# Patient Record
Sex: Male | Born: 1940 | Race: White | Hispanic: No | Marital: Married | Smoking: Never smoker
Health system: Southern US, Community
[De-identification: ages and names within clinical notes are randomized; demographics above are authoritative.]

## PROBLEM LIST (undated history)

## (undated) DIAGNOSIS — G20 Parkinson's disease: Secondary | ICD-10-CM

## (undated) DIAGNOSIS — E785 Hyperlipidemia, unspecified: Secondary | ICD-10-CM

## (undated) DIAGNOSIS — M4800 Spinal stenosis, site unspecified: Secondary | ICD-10-CM

## (undated) DIAGNOSIS — G20A1 Parkinson's disease without dyskinesia, without mention of fluctuations: Secondary | ICD-10-CM

## (undated) HISTORY — DX: Hyperlipidemia, unspecified: E78.5

## (undated) HISTORY — PX: KIDNEY SURGERY: SHX687

## (undated) HISTORY — DX: Parkinson's disease without dyskinesia, without mention of fluctuations: G20.A1

## (undated) HISTORY — PX: APPENDECTOMY: SHX54

## (undated) HISTORY — DX: Parkinson's disease: G20

---

## 1998-09-21 ENCOUNTER — Encounter: Payer: Self-pay | Admitting: Cardiology

## 1998-09-21 ENCOUNTER — Ambulatory Visit (HOSPITAL_COMMUNITY): Admission: RE | Admit: 1998-09-21 | Discharge: 1998-09-21 | Payer: Self-pay | Admitting: Cardiology

## 1999-05-01 ENCOUNTER — Encounter: Payer: Self-pay | Admitting: Family Medicine

## 1999-05-01 ENCOUNTER — Encounter: Admission: RE | Admit: 1999-05-01 | Discharge: 1999-05-01 | Payer: Self-pay | Admitting: Family Medicine

## 2002-01-15 ENCOUNTER — Encounter: Admission: RE | Admit: 2002-01-15 | Discharge: 2002-01-15 | Payer: Self-pay | Admitting: Neurology

## 2002-01-15 ENCOUNTER — Encounter: Payer: Self-pay | Admitting: Neurology

## 2007-06-10 ENCOUNTER — Ambulatory Visit: Payer: Self-pay | Admitting: Gastroenterology

## 2007-06-24 ENCOUNTER — Encounter: Payer: Self-pay | Admitting: Gastroenterology

## 2007-06-24 ENCOUNTER — Ambulatory Visit: Payer: Self-pay | Admitting: Gastroenterology

## 2009-03-06 HISTORY — PX: INGUINAL HERNIA REPAIR: SUR1180

## 2010-07-25 ENCOUNTER — Encounter: Payer: Self-pay | Admitting: Gastroenterology

## 2011-11-20 ENCOUNTER — Encounter: Payer: Self-pay | Admitting: Gastroenterology

## 2012-07-07 ENCOUNTER — Telehealth: Payer: Self-pay | Admitting: Neurology

## 2012-07-07 NOTE — Telephone Encounter (Signed)
I called the patient back to find out what meds he wants sent since he does not want them all at the same time.  Got no answer.  Left message.  Pending return call.

## 2012-07-07 NOTE — Telephone Encounter (Signed)
I called back to find out what meds they do want sent now since they do not want all meds sent at once.  Got no answer.  Left message.  Asked that they please specify what med(s) they want sent today.

## 2012-07-09 NOTE — Telephone Encounter (Signed)
I have been unable to reach patient.  I do not know what his co-pay is, and therefore do not know which med(s) he can afford at this time.  Since he does not want them sent all at the same time, I do not know which one(s) he wants sent when.  Will have to wait for return call.

## 2012-07-10 MED ORDER — AMANTADINE HCL 100 MG PO CAPS: 100.0000 mg | ORAL_CAPSULE | Freq: Two times a day (BID) | ORAL | Status: DC

## 2012-07-10 MED ORDER — CARBIDOPA-LEVODOPA-ENTACAPONE 37.5-150-200 MG PO TABS: 1.0000 | ORAL_TABLET | Freq: Three times a day (TID) | ORAL | Status: DC

## 2012-07-10 NOTE — Telephone Encounter (Signed)
I called again and finally reached the patient.  He would like refills on Amantadine and Stalevo.  I told him I will send those today.  He would like them sent to mail order.  Says he does not need any at local pharmacy at this time.  He will call back when he needs refill on other medication.  Former [facility] patient assigned to Dr Frances Furbish.

## 2012-10-12 ENCOUNTER — Encounter: Payer: Self-pay | Admitting: Neurology

## 2012-10-12 ENCOUNTER — Ambulatory Visit (INDEPENDENT_AMBULATORY_CARE_PROVIDER_SITE_OTHER): Payer: Medicare Other | Admitting: Neurology

## 2012-10-12 VITALS — BP 105/64 | HR 69 | Temp 97.4°F | Ht 67.0 in | Wt 168.0 lb

## 2012-10-12 DIAGNOSIS — G20 Parkinson's disease: Secondary | ICD-10-CM

## 2012-10-12 DIAGNOSIS — G20A1 Parkinson's disease without dyskinesia, without mention of fluctuations: Secondary | ICD-10-CM

## 2012-10-12 HISTORY — DX: Parkinson's disease: G20

## 2012-10-12 HISTORY — DX: Parkinson's disease without dyskinesia, without mention of fluctuations: G20.A1

## 2012-10-12 NOTE — Progress Notes (Signed)
Subjective:    Mathews ID: Mark Mathews is a 72 y.o. male.  HPI  Interim history:   Mark Mathews is a very pleasant 72 year old right-handed gentleman who presents for followup consultation of his Parkinson's disease. He is unaccompanied today. This is his first visit after Dr. Imagene Gurney retirement and he last saw Dr. Fayrene Fearing love on 04/13/2012, at which time Dr. Sandria Manly felt that he was doing well. He stressed Mark importance of exercise. He did well on rotigotine patch in Mark past and this should be a consideration down Mark road he felt. He has an underlying medical history of hyperlipidemia and his current medications are baby aspirin, Artane 2 mg strength one tablet 3 times a day, at 7:30, 1 PM and 6 PM. He is on Stalevo 150 mg strength one pill 3 times a day at those times as well as amantadine 100 mg strength one tablet twice daily.  I reviewed Dr. Imagene Gurney prior notes and Mark Mathews's records and below is a summary of that review:  72 year old right-handed gentleman who started having right hand and arm tremor and was diagnosed with Parkinson's disease in July 1997. He did well on Mark dopamine agonist patch until he was taken off Mark market. He denies motor fluctuations and motor complications, orthostatic hypotension, excessive daytime somnolence, lower extremity edema, compulsive behavior, bladder incontinence, hallucinations, delusions, depression, recurrent falls or dystonia or memory loss. In February of this year his MMSE was 26, clock drawing was 3, animal fluency was 14. He describes mild, intermittent dyskinesias with wearing off. This does not bother him. He has no new complaints. He feels that he is stable. He describes no side effects. He tolerates Mark medications. His memory is stable he feels. He has mild forgetfulness.   His Past Medical History Is Significant For: Past Medical History  Diagnosis Date  . Other and unspecified hyperlipidemia   . Paralysis agitans     His Past  Surgical History Is Significant For: Past Surgical History  Procedure Laterality Date  . Inguinal hernia repair Bilateral 03/06/2009    His Family History Is Significant For: Family History  Problem Relation Age of Onset  . Heart failure Father     His Social History Is Significant For: History   Social History  . Marital Status: Married    Spouse Name: N/A    Number of Children: N/A  . Years of Education: N/A   Social History Main Topics  . Smoking status: Never Smoker   . Smokeless tobacco: None  . Alcohol Use: None  . Drug Use: None  . Sexually Active: None   Other Topics Concern  . None   Social History Narrative  . None    His Allergies Are:  No Known Allergies:   His Current Medications Are:  Outpatient Encounter Prescriptions as of 10/12/2012  Medication Sig Dispense Refill  . amantadine (SYMMETREL) 100 MG capsule Take 1 capsule (100 mg total) by mouth 2 (two) times daily.  180 capsule  2  . carbidopa-levodopa-entacapone (STALEVO) 37.5-150-200 MG per tablet Take 1 tablet by mouth 3 (three) times daily. 7:30am, 1:00pm and 6:00pm  270 tablet  2  . trihexyphenidyl (ARTANE) 2 MG tablet Take 2 mg by mouth 3 (three) times daily. 7:30am, 1:00pm and 6:00pm       No facility-administered encounter medications on file as of 10/12/2012.    Review of Systems  Hematological: Bruises/bleeds easily.    Objective:  Neurologic Exam  Physical Exam Physical Examination:  Filed Vitals:   10/12/12 0950  BP: 105/64  Pulse: 69  Temp: 97.4 F (36.3 C)    General Examination: Mark Mathews is a very pleasant 72 y.o. male in no acute distress.  HEENT: Normocephalic, atraumatic, pupils are equal, round and reactive to light and accommodation. Funduscopic exam is normal with sharp disc margins noted. Extraocular tracking shows mild saccadic breakdown without nystagmus noted. There is no limitation to upper gaze. There is mild decrease in eye blink rate. Hearing is intact.  Tympanic membranes are clear bilaterally. Face is symmetric with mild facial masking and normal facial sensation. There is no lip, neck or jaw tremor. Neck is mildly rigid with intact passive ROM. There are no carotid bruits on auscultation. Oropharynx exam reveals mild mouth dryness. No significant airway crowding is noted. Mallampati is class II. Tongue protrudes centrally and palate elevates symmetrically.   There is no drooling.   Chest: is clear to auscultation without wheezing, rhonchi or crackles noted.  Heart: sounds are regular and normal without murmurs, rubs or gallops noted.   Abdomen: is soft, non-tender and non-distended with normal bowel sounds appreciated on auscultation.  Extremities: There is no pitting edema in Mark distal lower extremities bilaterally. Pedal pulses are intact. Chronic stasis-like changes are noted in Mark distal legs bilaterally. There are no varicose veins.  Skin: is warm and dry with no trophic changes noted. Age-related changes are noted on Mark skin.   Musculoskeletal: exam reveals no obvious joint deformities, tenderness, joint swelling or erythema.  Neurologically:  Mental status: Mark Mathews is awake and alert, paying good  attention. He is able to completely provide Mark history. He is oriented to: person, place, time/date, situation, day of week, month of year and year. His memory, attention, language and knowledge are fairly well preserved. There is no aphasia, agnosia, apraxia or anomia. There is a mild degree of bradyphrenia. Speech is moderately hypophonic with mild dysarthria noted. Mood is congruent and affect is normal.   Cranial nerves are as described above under HEENT exam. In addition, shoulder shrug is normal with equal shoulder height noted.  Motor exam: Normal bulk, and strength for age is noted. There are mild dyskinesias noted.   Tone is mildly rigid with presence of cogwheeling in Mark right upper extremity. There is overall mild  bradykinesia. There is no drift or rebound.  There is a mild resting tremor in Mark right upper extremity and a mild resting tremor in Mark right lower extremity. Mark tremor is intermittent.  Romberg is negative.  Reflexes are 2+ in Mark upper extremities and 1+ in Mark lower extremities.  Fine motor skills exam: Finger taps are mildly impaired on Mark right and Minimally impaired on Mark left. Hand movements are mildly impaired on Mark right and mildly impaired on Mark left. RAP (rapid alternating patting) is mildly impaired on Mark right and mildly impaired on Mark left. Foot taps are mildly to moderately impaired on Mark right and mildly impaired on Mark left. Foot agility (in Mark form of heel stomping) is mildly to moderately impaired on Mark right and mildly impaired on Mark left.    Cerebellar testing shows no dysmetria or intention tremor on finger to nose testing. Heel to shin is unremarkable bilaterally. There is no truncal or gait ataxia.   Sensory exam is intact to light touch, pinprick, vibration, temperature sense and proprioception in Mark upper and lower extremities.   Gait, station and balance: He stands up from Mark seated position with no  significant difficulty and does not need to push up with His hands. He needs no assistance. No veering to one side is noted. He is noted to lean to Mark right slightly been sitting. Posture is mildly stooped. Stance is narrow-based. He walks with decrease in stride length and pace and decreased arm swing on Mark left with a slight intermittent hand tremor noted while walking on Mark left. He turns en bloc. Tandem walk is not possible. Balance is Preserved.      Assessment and Plan:   Assessment and Plan:  In summary, Mark Mathews is a very pleasant 72 y.o.-year old male with a history of Parkinson's disease, right-sided predominant, diagnosed in 13. His physical exam is stable and has not progressed in Mark last 6 months. He is doing fairly well at this time and I  reassured Mark Mathews in that regard.  I had a long chat with Mark Mathews about His symptoms, my findings and Mark diagnosis of Parksinson's disease, its prognosis and treatment options. We talked about medical treatments and non-pharmacological approaches. We talked about maintaining a healthy lifestyle in general. I encouraged Mark Mathews to eat healthy, exercise daily and keep well hydrated, to keep a scheduled bedtime and wake time routine, to not skip any meals and eat healthy snacks in between meals and to have protein with every meal. In particular, I stressed Mark importance of regular exercise, within of course Mark Mathews's own mobility limitations.   As far as further diagnostic testing is concerned, I suggested: no change. As far as medications are concerned, I recommended Mark following at this time: no change. He did not require any refills today.   I answered all his questions today and Mark Mathews was in agreement with Mark above outlined plan. I would like to see Mark Mathews back in 6 months, sooner if Mark need arises and encouraged him to call with any interim questions, concerns, problems or updates and refill requests.

## 2012-10-12 NOTE — Patient Instructions (Addendum)
I think overall you are doing fairly well and are stable at this point.   I do have some generic suggestions for you today:  Please make sure that you drink plenty of fluids. I would like for you to exercise daily for example in the form of walking 20-30 minutes every day, if you can. Please keep a regular sleep-wake schedule, keep regular meal times, do not skip any meals, eat  healthy snacks in between meals, such as fruit or nuts. Try to eat protein with every meal.   As far as your medications are concerned, I would like to suggest: no changes.   As far as diagnostic testing, I recommend: no new test.   Engage in social activities in your community and with your family and try to keep up with current events by reading the newspaper or watching the news.  I do not think we need to make any changes in your medications at this point. I think you're stable enough that I can see you back in 6 months, sooner if we need to. Please call us if you have any interim questions, concerns, or problems or updates to need to discuss.  Brett Canales is my clinical assistant and will answer any of your questions and relay your messages to me and will give you my messages.   Our phone number is [phone]. We also have an after hours call service for urgent matters and there is a physician on-call for urgent questions. For any emergencies you know to call 911 or go to the nearest emergency room.

## 2013-01-05 ENCOUNTER — Encounter: Payer: Self-pay | Admitting: Gastroenterology

## 2013-01-22 ENCOUNTER — Telehealth: Payer: Self-pay | Admitting: Neurology

## 2013-01-22 MED ORDER — CARBIDOPA-LEVODOPA-ENTACAPONE 37.5-150-200 MG PO TABS: 1.0000 | ORAL_TABLET | Freq: Three times a day (TID) | ORAL | Status: DC

## 2013-01-22 MED ORDER — TRIHEXYPHENIDYL HCL 2 MG PO TABS: 2.0000 mg | ORAL_TABLET | Freq: Three times a day (TID) | ORAL | Status: DC

## 2013-01-22 MED ORDER — AMANTADINE HCL 100 MG PO CAPS: 100.0000 mg | ORAL_CAPSULE | Freq: Two times a day (BID) | ORAL | Status: DC

## 2013-01-22 NOTE — Telephone Encounter (Signed)
I called pt to relay that received message and forwarded it to [place] B in pharmacy,  He need one weeks worth of all meds (noted) for PD.  His mail order delayed.  May LM on phone # [employer].  [facility] is where the weeks worth of med to be called in.

## 2013-01-22 NOTE — Telephone Encounter (Signed)
Please advise 

## 2013-01-22 NOTE — Telephone Encounter (Signed)
I sent the pharmacy a 2 week supply in case there is a delay in the mail due to the upcoming Thanksgiving Holiday.

## 2013-04-12 ENCOUNTER — Telehealth: Payer: Self-pay | Admitting: Neurology

## 2013-04-12 MED ORDER — CARBIDOPA-LEVODOPA-ENTACAPONE 37.5-150-200 MG PO TABS: 1.0000 | ORAL_TABLET | Freq: Three times a day (TID) | ORAL | Status: DC

## 2013-04-12 MED ORDER — TRIHEXYPHENIDYL HCL 2 MG PO TABS: 2.0000 mg | ORAL_TABLET | Freq: Three times a day (TID) | ORAL | Status: DC

## 2013-04-12 MED ORDER — AMANTADINE HCL 100 MG PO CAPS: 100.0000 mg | ORAL_CAPSULE | Freq: Two times a day (BID) | ORAL | Status: DC

## 2013-04-12 NOTE — Telephone Encounter (Signed)
Refills sent to last until appt  

## 2013-04-12 NOTE — Telephone Encounter (Signed)
During appointment reminder call PT stated that he needed his Stalevo, Artane and Symmetrel prescriptions all need to go to [facility] for refill.  If there are any questions please call.

## 2013-04-14 ENCOUNTER — Ambulatory Visit: Payer: Medicare Other | Admitting: Neurology

## 2013-08-12 ENCOUNTER — Encounter: Payer: Self-pay | Admitting: Neurology

## 2013-08-12 ENCOUNTER — Ambulatory Visit (INDEPENDENT_AMBULATORY_CARE_PROVIDER_SITE_OTHER): Payer: Medicare Other | Admitting: Neurology

## 2013-08-12 VITALS — BP 111/68 | HR 72 | Temp 97.4°F | Ht 67.0 in | Wt 171.0 lb

## 2013-08-12 DIAGNOSIS — G20 Parkinson's disease: Secondary | ICD-10-CM

## 2013-08-12 MED ORDER — AMANTADINE HCL 100 MG PO CAPS: 100.0000 mg | ORAL_CAPSULE | Freq: Two times a day (BID) | ORAL | Status: DC

## 2013-08-12 MED ORDER — TRIHEXYPHENIDYL HCL 2 MG PO TABS: 2.0000 mg | ORAL_TABLET | Freq: Three times a day (TID) | ORAL | Status: DC

## 2013-08-12 MED ORDER — CARBIDOPA-LEVODOPA-ENTACAPONE 37.5-150-200 MG PO TABS: 1.0000 | ORAL_TABLET | Freq: Three times a day (TID) | ORAL | Status: DC

## 2013-08-12 NOTE — Patient Instructions (Signed)
I think your Parkinson's disease has remained fairly stable, which is reassuring. Nevertheless, as you know, this disease does progress with time. It can affect your balance, your memory, your mood, your bowel and bladder function, your posture, balance and walking. Overall you are doing fairly well but I do want to suggest a few things today:  Remember to drink plenty of fluid, eat healthy meals and do not skip any meals. Try to eat protein with a every meal and eat a healthy snack such as fruit or nuts in between meals. Try to keep a regular sleep-wake schedule and try to exercise daily, particularly in the form of walking, 20-30 minutes a day, if you can.   Taking your medication on schedule is key.   Try to stay active physically and mentally. Engage in social activities in your community and with your family and try to keep up with current events by reading the newspaper or watching the news. Try to do word puzzles and you may like to do word puzzles and brain games on the computer such as on [email or web address]   As far as your medications are concerned, I would like to suggest that you take your current medication with the following additional changes: no changes today.     As far as diagnostic testing, I will order: no new test.   I would like to see you back in 6 months, sooner if we need to. Please call us with any interim questions, concerns, problems, updates or refill requests.  I have refilled your amantadine, artane and Stalevo.

## 2013-08-12 NOTE — Progress Notes (Signed)
Subjective:    Mark Mathews Mathews ID: Mark Mathews Mark Mathews Mathews is a 73 y.o. male.  HPI    Interim history:   Mark Mathews Mark Mathews Mathews is a very pleasant 73 year old right-handed gentleman with a history of hyperlipidemia and an otherwise benign medical history, who presents for followup consultation of his right-sided predominant Parkinson's disease, associated with mild memory loss and mild, intermittent dyskinesias with wearing off. He is unaccompanied today. I first met him on 10/12/2012, at which time I felt he was stable and I did not make any changes to his medications at Mark Mathews time. He has been on baby aspirin, Artane 2 mg strength one tablet 3 times a day, at 7:30, 1 PM and 6 PM. He is on Stalevo 150 mg strength one pill 3 times a day at those times as well as amantadine 100 mg strength one tablet twice daily.   Today, he reports feeling well and stable. He had missed an appointment, because he had to tend to his wife, who was diagnosed with breast cancer and has been in treatment. He denies any recent falls, he drives without problems, he denies anxiety, depression, delusions, memory loss other than mild forgetfulness, bladder problems, he had some intermittent constipation but has increased his fresh fruit intake which has helped. He tries to walk regularly but lately has not walked with regularity.   He previously followed with Dr. Morene Antu and was last seen by him on 04/13/2012, at which time Dr. Erling Cruz felt that he was doing well. He stressed Mark Mathews importance of exercise. He did well on rotigotine patch in Mark Mathews past and this should be a consideration down Mark Mathews road. He started having right hand and arm tremor and was diagnosed with Parkinson's disease in July 1997. He did well on Mark Mathews dopamine agonist patch until it was taken off Mark Mathews market at Mark Mathews time. He denies motor fluctuations and motor complications, orthostatic hypotension, excessive daytime somnolence, lower extremity edema, compulsive behavior, bladder incontinence,  hallucinations, delusions, depression, recurrent falls or dystonia but has had mild memory loss. In February of this year his MMSE was 26, clock drawing was 3, animal fluency was 14.   His Past Medical History Is Significant For: Past Medical History  Diagnosis Date  . Other and unspecified hyperlipidemia   . Paralysis agitans   . Parkinson's disease 10/12/2012    His Past Surgical History Is Significant For: Past Surgical History  Procedure Laterality Date  . Inguinal hernia repair Bilateral 03/06/2009    His Family History Is Significant For: Family History  Problem Relation Age of Onset  . Heart failure Father     His Social History Is Significant For: History   Social History  . Marital Status: Married    Spouse Name: N/A    Number of Children: N/A  . Years of Education: N/A   Social History Main Topics  . Smoking status: Never Smoker   . Smokeless tobacco: None  . Alcohol Use: None  . Drug Use: None  . Sexual Activity: None   Other Topics Concern  . None   Social History Narrative  . None    His Allergies Are:  No Known Allergies:   His Current Medications Are:  Outpatient Encounter Prescriptions as of 08/12/2013  Medication Sig  . amantadine (SYMMETREL) 100 MG capsule Take 1 capsule (100 mg total) by mouth 2 (two) times daily.  Marland Kitchen aspirin 81 MG tablet Take 81 mg by mouth daily.  . carbidopa-levodopa-entacapone (STALEVO) 37.5-150-200 MG per tablet Take 1 tablet  by mouth 3 (three) times daily. 7:30am, 1:00pm and 6:00pm  . KRILL OIL PO Take 1 capsule by mouth daily.  . Multiple Vitamin (MULTIVITAMIN) tablet Take 1 tablet by mouth daily.  . trihexyphenidyl (ARTANE) 2 MG tablet Take 1 tablet (2 mg total) by mouth 3 (three) times daily. 7:30am, 1:00pm and 6:00pm  . [DISCONTINUED] amantadine (SYMMETREL) 100 MG capsule Take 1 capsule (100 mg total) by mouth 2 (two) times daily.  . [DISCONTINUED] carbidopa-levodopa-entacapone (STALEVO) 37.5-150-200 MG per tablet Take  1 tablet by mouth 3 (three) times daily. 7:30am, 1:00pm and 6:00pm  . [DISCONTINUED] trihexyphenidyl (ARTANE) 2 MG tablet Take 1 tablet (2 mg total) by mouth 3 (three) times daily. 7:30am, 1:00pm and 6:00pm  :  Review of Systems:  Out of a complete 14 point review of systems, all are reviewed and negative with Mark Mathews exception of these symptoms as listed below:   Review of Systems  Constitutional: Negative.   HENT: Negative.   Eyes: Negative.   Respiratory: Negative.   Cardiovascular: Negative.   Gastrointestinal: Negative.   Endocrine: Negative.   Genitourinary: Negative.   Musculoskeletal: Negative.   Skin: Negative.   Allergic/Immunologic: Negative.   Neurological: Negative.   Hematological: Negative.   Psychiatric/Behavioral: Negative.   All other systems reviewed and are negative.   Objective:  Neurologic Exam  Physical Exam Physical Examination:   Filed Vitals:   08/12/13 0917  BP: 111/68  Pulse: 72  Temp: 97.4 F (36.3 C)   General Examination: Mark Mathews Mark Mathews Mathews is a very pleasant 73 y.o. male in no acute distress.  HEENT: Normocephalic, atraumatic, pupils are equal, round and reactive to light and accommodation. Funduscopic exam is normal with sharp disc margins noted. Extraocular tracking shows mild saccadic breakdown without nystagmus noted. There is no limitation to upper gaze. There is mild decrease in eye blink rate. Hearing is intact. Tympanic membranes are clear bilaterally. Face is symmetric with mild facial masking and normal facial sensation. There is no lip, neck or jaw tremor. Neck is mildly rigid with intact passive ROM. There are no carotid bruits on auscultation. Oropharynx exam reveals mild mouth dryness. No significant airway crowding is noted. Mallampati is class II. Tongue protrudes centrally and palate elevates symmetrically. There is no drooling.   Chest: is clear to auscultation without wheezing, rhonchi or crackles noted.  Heart: sounds are regular  and normal without murmurs, rubs or gallops noted.   Abdomen: is soft, non-tender and non-distended with normal bowel sounds appreciated on auscultation.  Extremities: There is no pitting edema in Mark Mathews distal lower extremities bilaterally. Pedal pulses are intact. Chronic stasis-like changes are noted in Mark Mathews distal legs bilaterally. There are no varicose veins.  Skin: is warm and dry with no trophic changes noted. Age-related changes are noted on Mark Mathews skin.   Musculoskeletal: exam reveals no obvious joint deformities, tenderness, joint swelling or erythema.  Neurologically:  Mental status: Mark Mathews Mark Mathews Mathews is awake and alert, paying good  attention. He is able to completely provide Mark Mathews history. He is oriented to: person, place, time/date, situation, day of week, month of year and year. His memory, attention, language and knowledge are fairly well preserved. There is no aphasia, agnosia, apraxia or anomia. There is a mild degree of bradyphrenia. Speech is moderately hypophonic with mild dysarthria noted. Mood is congruent and affect is normal.   Cranial nerves are as described above under HEENT exam. In addition, shoulder shrug is normal with equal shoulder height noted.  Motor exam: Normal bulk, and strength  for age is noted. There are mild dyskinesias noted.   Tone is mildly rigid with presence of cogwheeling in Mark Mathews right upper extremity. There is overall mild bradykinesia. There is no drift or rebound.  There is a no significant resting tremor today. He seems to have very slight, intermittent lower body dyskinesias. He's not aware of these movements.  Romberg is negative.   Reflexes are 1+ in Mark Mathews upper extremities and 1+ in Mark Mathews lower extremities.  Fine motor skills exam: Finger taps are mildly impaired on Mark Mathews left and Minimally impaired on Mark Mathews right. Hand movements are mildly impaired on Mark Mathews right and mildly impaired on Mark Mathews left. RAP (rapid alternating patting) is mildly impaired on Mark Mathews right and  mildly impaired on Mark Mathews left. Foot taps are mildly to moderately impaired on Mark Mathews right and mild to moderately impaired on Mark Mathews left. Foot agility (in Mark Mathews form of heel stomping) is mildly to moderately impaired b/l.   Cerebellar testing shows no dysmetria or intention tremor on finger to nose testing. Heel to shin is unremarkable bilaterally. There is no truncal or gait ataxia.   Sensory exam is intact to light touch, pinprick, vibration, temperature sense in Mark Mathews upper and lower extremities.   Gait, station and balance: He stands up from Mark Mathews seated position with no significant difficulty and does not need to push up with His hands. He needs no assistance. No veering to one side is noted. He is noted to lean to Mark Mathews right slightly been sitting. Posture is mildly stooped. Stance is narrow-based. He walks with decrease in stride length and pace and decreased arm swing on Mark Mathews left with a slight intermittent hand tremor noted while walking on Mark Mathews left. He turns en bloc. Tandem walk is not possible. Balance is Preserved.      Assessment and Plan:   In summary, Mark Mathews Mark Mathews Mathews is a very pleasant 73 year old male with a history of Parkinson's disease, left-sided predominant, diagnosed in 58. His physical exam is fairly stable. I mentioned R sided predominance last time, but he appears to have L sided predominant findings. He is doing fairly well at this time and I reassured Mark Mathews Mark Mathews Mathews in that regard.  I again had a long chat with Mark Mathews Mark Mathews Mathews about His symptoms, my findings and Mark Mathews diagnosis of Parksinson's disease, its prognosis and treatment options. We talked about medical treatments and non-pharmacological approaches. We talked about maintaining a healthy lifestyle in general. I encouraged Mark Mathews Mark Mathews Mathews to eat healthy, exercise daily and keep well hydrated, to keep a scheduled bedtime and wake time routine, to not skip any meals and eat healthy snacks in between meals and to have protein with every meal. In  particular, I stressed Mark Mathews importance of regular exercise, within of course Mark Mathews Mark Mathews Mathews's own mobility limitations.   As far as further diagnostic testing is concerned, I suggested: no change. As far as medications are concerned, I recommended Mark Mathews following at this time: no change. I renewed his prescriptions for mail order today. I answered all his questions today and Mark Mathews Mark Mathews Mathews was in agreement with Mark Mathews above outlined plan. I would like to see Mark Mathews Mark Mathews Mathews back in 6 months, sooner if Mark Mathews need arises and encouraged him to call with any interim questions, concerns, problems or updates and refill requests.

## 2014-02-11 ENCOUNTER — Encounter: Payer: Self-pay | Admitting: Neurology

## 2014-02-11 ENCOUNTER — Ambulatory Visit (INDEPENDENT_AMBULATORY_CARE_PROVIDER_SITE_OTHER): Payer: Medicare Other | Admitting: Neurology

## 2014-02-11 VITALS — BP 107/67 | HR 73 | Temp 97.0°F | Ht 66.5 in | Wt 172.0 lb

## 2014-02-11 DIAGNOSIS — G20 Parkinson's disease: Secondary | ICD-10-CM

## 2014-02-11 NOTE — Patient Instructions (Addendum)
I think your Parkinson's disease has remained fairly stable, which is reassuring. Nevertheless, as you know, this disease does progress with time. It can affect your balance, your memory, your mood, your bowel and bladder function, your posture, balance and walking. Overall you are doing fairly well but I do want to suggest a few things today:  Remember to drink plenty of fluid, eat healthy meals and do not skip any meals. Try to eat protein with a every meal and eat a healthy snack such as fruit or nuts in between meals. Try to keep a regular sleep-wake schedule and try to exercise daily, particularly in the form of walking, 20 minutes a day, if you can.   I do need you to drink more water, try to drink at least 6 glasses.   Taking your medication on schedule is key. Please do not climb ladders.   Try to stay active physically and mentally. Engage in social activities in your community and with your family and try to keep up with current events by reading the newspaper or watching the news. Try to do word puzzles and you may like to do word puzzles and brain games on the computer such as on [email or web address].   As far as your medications are concerned, I would like to suggest that you take your current medication with the following additional changes: no changes.   I would like to see you back in 3 months, sooner if we need to. Please call us with any interim questions, concerns, problems, updates or refill requests.  Our phone number is [phone]. We also have an after hours call service for urgent matters and there is a physician on-call for urgent questions, that cannot wait till the next work day. For any emergencies you know to call 911 or go to the nearest emergency room.

## 2014-02-11 NOTE — Progress Notes (Signed)
Subjective:    Patient ID: [name] is a 73 y.o. male.  HPI     Interim history:   Mark Mathews is a very pleasant 73 year old right-handed gentleman with a history of hyperlipidemia and an otherwise benign medical history, who presents for followup consultation of his left-sided predominant Parkinson's disease, associated with mild memory loss and mild, intermittent dyskinesias with wearing off. He is unaccompanied today. I last saw him on 08/12/2013, at which time he reported feeling well and stable. Unfortunately, in the interim, his wife had been diagnosed with breast cancer. He had to miss an appointment because of his health issues. He denied any recent falls and had no issues driving. He had no mood or memory issues other than known and mild forgetfulness. He reported intermittent constipation but was able to increase his natural fiber intake. He was not walking as regularly as before. I did not suggest any medication changes.  Today, he reports doing well, and wife is better, she had surgery. He walks regularly. He does admit that he may not drink enough water. Overall he feels stable.  I first met him on 10/12/2012, at which time I felt he was stable and I did not make any changes to his medications at the time. He has been on baby aspirin, Artane 2 mg strength one tablet 3 times a day, at 7:30, 1 PM and 6 PM. He is on Stalevo 150 mg strength one pill 3 times a day at those times as well as amantadine 100 mg strength one tablet twice daily.   He previously followed with Dr. Morene Antu and was last seen by him on 04/13/2012, at which time Dr. Erling Cruz felt that he was doing well. He stressed the importance of exercise. He did well on rotigotine patch in the past and this should be a consideration down the road. He started having right hand and arm tremor and was diagnosed with Parkinson's disease in July 1997. He did well on the dopamine agonist patch until it was taken off the market at the  time. He denies motor fluctuations and motor complications, orthostatic hypotension, excessive daytime somnolence, lower extremity edema, compulsive behavior, bladder incontinence, hallucinations, delusions, depression, recurrent falls or dystonia but has had mild memory loss. In February of this year his MMSE was 26, clock drawing was 3, animal fluency was 14.   His Past Medical History Is Significant For: Past Medical History  Diagnosis Date  . Other and unspecified hyperlipidemia   . Paralysis agitans   . Parkinson's disease 10/12/2012    His Past Surgical History Is Significant For: Past Surgical History  Procedure Laterality Date  . Inguinal hernia repair Bilateral 03/06/2009    His Family History Is Significant For: Family History  Problem Relation Age of Onset  . Heart failure Father     His Social History Is Significant For: History   Social History  . Marital Status: Married    Spouse Name: Mark Mathews    Number of Children: 2  . Years of Education: college   Occupational History  .      retired   Social History Main Topics  . Smoking status: Never Smoker   . Smokeless tobacco: Never Used  . Alcohol Use: No  . Drug Use: No  . Sexual Activity: None   Other Topics Concern  . None   Social History Narrative   Consumes caffeine rarely,1 cup daily    His Allergies Are:  No Known Allergies:  His Current Medications Are:  Outpatient Encounter Prescriptions as of 02/11/2014  Medication Sig  . amantadine (SYMMETREL) 100 MG capsule Take 1 capsule (100 mg total) by mouth 2 (two) times daily.  Marland Kitchen aspirin 81 MG tablet Take 81 mg by mouth daily.  . carbidopa-levodopa-entacapone (STALEVO) 37.5-150-200 MG per tablet Take 1 tablet by mouth 3 (three) times daily. 7:30am, 1:00pm and 6:00pm  . KRILL OIL PO Take 1 capsule by mouth daily.  . Multiple Vitamin (MULTIVITAMIN) tablet Take 1 tablet by mouth daily.  . trihexyphenidyl (ARTANE) 2 MG tablet Take 1 tablet (2 mg total)  by mouth 3 (three) times daily. 7:30am, 1:00pm and 6:00pm  :  Review of Systems:  Out of a complete 14 point review of systems, all are reviewed and negative with the exception of these symptoms as listed below:   Review of Systems  All other systems reviewed and are negative.   Objective:  Neurologic Exam  Physical Exam Physical Examination:   Filed Vitals:   02/11/14 1107  BP: 107/67  Pulse: 73  Temp: 97 F (36.1 C)   General Examination: The patient is a very pleasant 73 y.o. male in no acute distress.  HEENT: Normocephalic, atraumatic, pupils are equal, round and reactive to light and accommodation. Extraocular tracking shows mild saccadic breakdown without nystagmus noted. There is no limitation to upper gaze. There is mild decrease in eye blink rate. Hearing is intact. Face is symmetric with mild facial masking and normal facial sensation. There is no lip, neck or jaw tremor. Neck is mildly rigid with intact passive ROM. There are no carotid bruits on auscultation. Oropharynx exam reveals moderate mouth dryness. No significant airway crowding is noted. Mallampati is class II. Tongue protrudes centrally and palate elevates symmetrically. There is no drooling.   Chest: is clear to auscultation without wheezing, rhonchi or crackles noted.  Heart: sounds are regular and normal without murmurs, rubs or gallops noted.   Abdomen: is soft, non-tender and non-distended with normal bowel sounds appreciated on auscultation.  Extremities: There is no pitting edema in the distal lower extremities bilaterally. Pedal pulses are intact. Chronic stasis-like changes are noted in the distal legs bilaterally. There are no varicose veins.  Skin: is warm and dry with no trophic changes noted. Age-related changes are noted on the skin.   Musculoskeletal: exam reveals no obvious joint deformities, tenderness, joint swelling or erythema.  Neurologically:  Mental status: The patient is awake and  alert, paying good  attention. He is able to completely provide the history. He is oriented to: person, place, time/date, situation, day of week, month of year and year. His memory, attention, language and knowledge are fairly well preserved. There is no aphasia, agnosia, apraxia or anomia. There is a mild degree of bradyphrenia. Speech is moderately hypophonic with mild dysarthria noted. Mood is congruent and affect is normal.   Cranial nerves are as described above under HEENT exam. In addition, shoulder shrug is normal with equal shoulder height noted.  Motor exam: Normal bulk, and strength for age is noted. There are mild dyskinesias noted.   Tone is mildly rigid with presence of cogwheeling in the right upper extremity. There is overall mild bradykinesia. There is no drift or rebound.  There is an intermittent resting tremor in both upper extremities. He has evidence of slight, intermittent lower body dyskinesias. Romberg is negative.   Reflexes are 1+ in the upper extremities and 1+ in the lower extremities.  Fine motor skills exam: Finger taps  are mildly impaired on the left and Minimally impaired on the right. Hand movements are mildly impaired on the right and mildly impaired on the left. RAP (rapid alternating patting) is mildly impaired on the right and mildly impaired on the left. Foot taps are mildly to moderately impaired on the right and mild to moderately impaired on the left. Foot agility (in the form of heel stomping) is mildly to moderately impaired b/l.   Cerebellar testing shows no dysmetria or intention tremor on finger to nose testing. Heel to shin is unremarkable bilaterally. There is no truncal or gait ataxia.   Sensory exam is intact to light touch.   Gait, station and balance: He stands up from the seated position with no significant difficulty and does not need to push up with His hands. He needs no assistance. No veering to one side is noted. He is noted to lean to the  right slightly while sitting and he has with standing some scoliosis and his upper body slightly kinked to the right. Posture is mildly stooped, more pronounced from last time. Stance is narrow-based. He walks with decrease in stride length and pace and decreased arm swing on the left with a slight intermittent hand tremor noted while walking on the left. He turns en bloc. Tandem walk is not possible. Balance is minimally impaired.       Assessment and Plan:   In summary, Mark Mathews is a very pleasant 73 year old male with a history of Parkinson's disease, left-sided predominant, diagnosed in 13. His physical exam is fairly stable. I mentioned R sided predominance during my first visit, and he recalls noticing a R foot tremor while in his dentist's chair (back in 1997), but for me, he appears to have L sided predominant findings. He is doing fairly well at this time and I reassured the patient in that regard. He has had a more consistent tremor today and his posture seems worse than last time. I again had a long chat with the patient about His symptoms, my findings and the diagnosis of Parksinson's disease, its prognosis and treatment options. We talked about medical treatments and non-pharmacological approaches. We talked about maintaining a healthy lifestyle in general. I encouraged the patient to eat healthy, exercise daily and keep well hydrated, to keep a scheduled bedtime and wake time routine, to not skip any meals and eat healthy snacks in between meals and to have protein with every meal. In particular, I stressed the importance of regular exercise, within of course the patient's own mobility limitations.  I did ask him to increase his water intake. As far as further diagnostic testing is concerned, I suggested: no change. As far as medications are concerned, I recommended the following at this time: no change. We talked about potentially increasing his Stalevo generic down the Road. His  amantadine and Artane will be probably not increased even in the future for fear of side effects. He does have some mouth dryness. I answered all his questions today and the patient was in agreement with the above outlined plan. I would like to see the patient back in 6 months, sooner if the need arises and encouraged him to call with any interim questions, concerns, problems or updates and refill requests.

## 2014-03-04 HISTORY — PX: CATARACT EXTRACTION: SUR2

## 2014-04-26 ENCOUNTER — Telehealth: Payer: Self-pay | Admitting: *Deleted

## 2014-04-26 ENCOUNTER — Encounter: Payer: Self-pay | Admitting: Neurology

## 2014-04-26 NOTE — Telephone Encounter (Signed)
Patient Handicapped Placard at the front desk.

## 2014-04-27 ENCOUNTER — Telehealth: Payer: Self-pay | Admitting: Neurology

## 2014-04-27 NOTE — Telephone Encounter (Signed)
Pls offer FU appt. Need 30 min.

## 2014-04-27 NOTE — Telephone Encounter (Signed)
Left message for patient to return call back to schedule sooner appointment per Dr Frances FurbishAthar.

## 2014-04-27 NOTE — Telephone Encounter (Signed)
Patient's wife is calling because patient is continually falling and his balance is off. Please call and discuss. Thank you.

## 2014-04-28 NOTE — Telephone Encounter (Signed)
Patient has been scheduled and confirmed with wife for sooner appt, per Dr. Frances FurbishAthar.

## 2014-05-05 ENCOUNTER — Encounter: Payer: Self-pay | Admitting: Neurology

## 2014-05-05 ENCOUNTER — Ambulatory Visit (INDEPENDENT_AMBULATORY_CARE_PROVIDER_SITE_OTHER): Payer: Medicare Other | Admitting: Neurology

## 2014-05-05 ENCOUNTER — Ambulatory Visit
Admission: RE | Admit: 2014-05-05 | Discharge: 2014-05-05 | Disposition: A | Discharge status: Home / Self Care | Payer: Self-pay | Source: Ambulatory Visit | Attending: Neurology | Admitting: Neurology

## 2014-05-05 VITALS — BP 125/73 | HR 79 | Temp 98.6°F | Resp 16 | Ht 66.5 in | Wt 176.0 lb

## 2014-05-05 DIAGNOSIS — R413 Other amnesia: Secondary | ICD-10-CM | POA: Diagnosis not present

## 2014-05-05 DIAGNOSIS — G20 Parkinson's disease: Secondary | ICD-10-CM

## 2014-05-05 DIAGNOSIS — R296 Repeated falls: Secondary | ICD-10-CM

## 2014-05-05 DIAGNOSIS — M79642 Pain in left hand: Secondary | ICD-10-CM

## 2014-05-05 DIAGNOSIS — R269 Unspecified abnormalities of gait and mobility: Secondary | ICD-10-CM | POA: Diagnosis not present

## 2014-05-05 MED ORDER — CARBIDOPA-LEVODOPA-ENTACAPONE 37.5-150-200 MG PO TABS: 1.0000 | ORAL_TABLET | Freq: Four times a day (QID) | ORAL | Status: DC
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
## 2014-05-05 NOTE — Progress Notes (Signed)
Quick Note:  Please advise patient, as suspected, there is a fracture of his left hand, the distal radial bone on the left side, and I would like to refer him to a hand surgeon. We discussed this previously during his appointment.  Huston FoleySaima Edmund Rick, MD, PhD [facility]  ______

## 2014-05-05 NOTE — Patient Instructions (Signed)
1. Decrease artane 2mg  strength to 1/2 pill in AM, 1 pill mid day, then 1 pill at night for 1 week, then 1/2 - 1 - 1/2 for 1 week, then 1/2 pill 3 times a day thereafter.  2. Increase the generic Stalevo to 1 pill 4 times a day, 7 am, 11 am, 3 pm and 7:00 pm 3. We will request physical therapy evaluation and potential treatment.  4. Start using a cane for safety.  5. Please have your eyes checked.  6. Please see your primary care physician.  7. Keep your appointment in June.

## 2014-05-06 ENCOUNTER — Telehealth: Payer: Self-pay | Admitting: Neurology

## 2014-05-06 DIAGNOSIS — S52502G Unspecified fracture of the lower end of left radius, subsequent encounter for closed fracture with delayed healing: Secondary | ICD-10-CM

## 2014-05-06 NOTE — Telephone Encounter (Signed)
Patient returning [place]onda [place], [place] call.  Please call [phone].

## 2014-05-06 NOTE — Progress Notes (Signed)
Left another message to call back, 2nd phone number is not working number.

## 2014-05-06 NOTE — Telephone Encounter (Signed)
Placed referral to hand surgery. Pls process.

## 2014-05-06 NOTE — Telephone Encounter (Signed)
[facility] he is aware of results and that Mark Mathews will be putting in referral.

## 2014-05-06 NOTE — Progress Notes (Signed)
Left message (yesterday) to call back

## 2014-05-10 NOTE — Telephone Encounter (Signed)
Denese KillingsJanisha [facility] 05[facility].

## 2014-07-03 ENCOUNTER — Emergency Department (HOSPITAL_COMMUNITY)
Admission: EM | Admit: 2014-07-03 | Discharge: 2014-07-03 | Disposition: A | Discharge status: Home / Self Care | Payer: Medicare Other | Attending: Emergency Medicine | Admitting: Emergency Medicine

## 2014-07-03 ENCOUNTER — Encounter (HOSPITAL_COMMUNITY): Payer: Self-pay

## 2014-07-03 ENCOUNTER — Emergency Department (HOSPITAL_COMMUNITY): Payer: Medicare Other

## 2014-07-03 DIAGNOSIS — Y9389 Activity, other specified: Secondary | ICD-10-CM | POA: Insufficient documentation

## 2014-07-03 DIAGNOSIS — G20 Parkinson's disease: Secondary | ICD-10-CM | POA: Diagnosis not present

## 2014-07-03 DIAGNOSIS — Z7982 Long term (current) use of aspirin: Secondary | ICD-10-CM | POA: Diagnosis not present

## 2014-07-03 DIAGNOSIS — Y998 Other external cause status: Secondary | ICD-10-CM | POA: Insufficient documentation

## 2014-07-03 DIAGNOSIS — E785 Hyperlipidemia, unspecified: Secondary | ICD-10-CM | POA: Insufficient documentation

## 2014-07-03 DIAGNOSIS — M545 Low back pain, unspecified: Secondary | ICD-10-CM

## 2014-07-03 DIAGNOSIS — M549 Dorsalgia, unspecified: Secondary | ICD-10-CM

## 2014-07-03 DIAGNOSIS — S3992XA Unspecified injury of lower back, initial encounter: Secondary | ICD-10-CM | POA: Insufficient documentation

## 2014-07-03 DIAGNOSIS — Y92410 Unspecified street and highway as the place of occurrence of the external cause: Secondary | ICD-10-CM | POA: Insufficient documentation

## 2014-07-03 DIAGNOSIS — Z79899 Other long term (current) drug therapy: Secondary | ICD-10-CM | POA: Insufficient documentation

## 2014-07-03 MED ORDER — OXYCODONE-ACETAMINOPHEN 5-325 MG PO TABS: 2.0000 | ORAL_TABLET | ORAL | Status: DC | PRN

## 2014-07-03 NOTE — Discharge Instructions (Signed)
Back Exercises Take tylenol or ibuprofen for pain and percocet for breakthrough pain.  Back exercises help treat and prevent back injuries. The goal of back exercises is to increase the strength of your abdominal and back muscles and the flexibility of your back. These exercises should be started when you no longer have back pain. Back exercises include:  Pelvic Tilt. Lie on your back with your knees bent. Tilt your pelvis until the lower part of your back is against the floor. Hold this position 5 to 10 sec and repeat 5 to 10 times.  Knee to Chest. Pull first 1 knee up against your chest and hold for 20 to 30 seconds, repeat this with the other knee, and then both knees. This may be done with the other leg straight or bent, whichever feels better.  Sit-Ups or Curl-Ups. Bend your knees 90 degrees. Start with tilting your pelvis, and do a partial, slow sit-up, lifting your trunk only 30 to 45 degrees off the floor. Take at least 2 to 3 seconds for each sit-up. Do not do sit-ups with your knees out straight. If partial sit-ups are difficult, simply do the above but with only tightening your abdominal muscles and holding it as directed.  Hip-Lift. Lie on your back with your knees flexed 90 degrees. Push down with your feet and shoulders as you raise your hips a couple inches off the floor; hold for 10 seconds, repeat 5 to 10 times.  Back arches. Lie on your stomach, propping yourself up on bent elbows. Slowly press on your hands, causing an arch in your low back. Repeat 3 to 5 times. Any initial stiffness and discomfort should lessen with repetition over time.  Shoulder-Lifts. Lie face down with arms beside your body. Keep hips and torso pressed to floor as you slowly lift your head and shoulders off the floor. Do not overdo your exercises, especially in the beginning. Exercises may cause you some mild back discomfort which lasts for a few minutes; however, if the pain is more severe, or lasts for more  than 15 minutes, do not continue exercises until you see your caregiver. Improvement with exercise therapy for back problems is slow.  See your caregivers for assistance with developing a proper back exercise program. Document Released: 03/28/2004 Document Revised: 05/13/2011 Document Reviewed: 12/20/2010 [facility] Patient Information 2015 [employer]. This information is not intended to replace advice given to you by your health care provider. Make sure you discuss any questions you have with your health care provider.

## 2014-07-03 NOTE — ED Notes (Signed)
Patient transported to X-ray 
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
## 2014-07-03 NOTE — ED Notes (Signed)
Pt is in stable condition upon d/c and is escorted from ED via wheelchair. 
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
## 2014-07-09 ENCOUNTER — Emergency Department (HOSPITAL_COMMUNITY)
Admission: EM | Admit: 2014-07-09 | Discharge: 2014-07-09 | Disposition: A | Discharge status: Home / Self Care | Payer: Medicare Other | Attending: Emergency Medicine | Admitting: Emergency Medicine

## 2014-07-09 ENCOUNTER — Encounter (HOSPITAL_COMMUNITY): Payer: Self-pay | Admitting: Emergency Medicine

## 2014-07-09 DIAGNOSIS — Z7982 Long term (current) use of aspirin: Secondary | ICD-10-CM | POA: Insufficient documentation

## 2014-07-09 DIAGNOSIS — M545 Low back pain, unspecified: Secondary | ICD-10-CM

## 2014-07-09 DIAGNOSIS — G8911 Acute pain due to trauma: Secondary | ICD-10-CM | POA: Insufficient documentation

## 2014-07-09 DIAGNOSIS — G20 Parkinson's disease: Secondary | ICD-10-CM | POA: Diagnosis not present

## 2014-07-09 DIAGNOSIS — Z79899 Other long term (current) drug therapy: Secondary | ICD-10-CM | POA: Insufficient documentation

## 2014-07-09 DIAGNOSIS — Z8639 Personal history of other endocrine, nutritional and metabolic disease: Secondary | ICD-10-CM | POA: Insufficient documentation
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
## 2014-08-03 ENCOUNTER — Encounter: Payer: Self-pay | Admitting: Gastroenterology

## 2014-08-10 ENCOUNTER — Telehealth: Payer: Self-pay | Admitting: Neurology

## 2014-08-10 DIAGNOSIS — G20 Parkinson's disease: Secondary | ICD-10-CM

## 2014-08-10 MED ORDER — TRIHEXYPHENIDYL HCL 2 MG PO TABS: 2.0000 mg | ORAL_TABLET | Freq: Three times a day (TID) | ORAL | Status: DC

## 2014-08-10 MED ORDER — AMANTADINE HCL 100 MG PO CAPS: 100.0000 mg | ORAL_CAPSULE | Freq: Two times a day (BID) | ORAL | Status: DC

## 2014-08-10 NOTE — Telephone Encounter (Signed)
Rx's have been sent to local pharmacy per patient request.  I called back to advise.  They are aware.

## 2014-08-10 NOTE — Telephone Encounter (Signed)
Patient called and stated that he did not get his medication filled in time for Rx. trihexyphenidyl (ARTANE) 2 MG tablet and Rx. amantadine (SYMMETREL) 100 MG capsule and would like to know if he can get a few pills to last him until the medication is filled. He will run out today. Please call and advise.   [id number] - [place], [place] - [address]

## 2014-08-10 NOTE — Telephone Encounter (Signed)
Patient's wife is calling to get  Rx's trihexyphenidyl (ARTANE) 2 MG tablet #21 and amantadine (SYMMETREL) 100 MG capsule #14 called to [facility] [address] until his mail order Rx's arrive. The patient called earlier but did not give the quantity of pills he needed. Patient is out of medication. Thank you.

## 2014-08-11 ENCOUNTER — Ambulatory Visit: Payer: Medicare Other | Admitting: Neurology

## 2014-11-16 ENCOUNTER — Other Ambulatory Visit: Payer: Self-pay | Admitting: Neurology

## 2014-11-17 NOTE — Telephone Encounter (Signed)
Last OV note says: 1. Decrease artane  strength to 1/2 pill in AM, 1 pill mid day, then 1 pill at night for 1 week, then 1/2 - 1 - 1/2 for 1 week, then 1/2 pill 3 times a day thereafter.

## 2014-11-23 ENCOUNTER — Telehealth: Payer: Self-pay | Admitting: Neurology

## 2014-11-23 MED ORDER — TRIHEXYPHENIDYL HCL 2 MG PO TABS: ORAL_TABLET | ORAL | Status: DC

## 2014-11-23 NOTE — Telephone Encounter (Signed)
30 day prescription for both medications was called in to [facility] on [address]. I will call patient and advise.   Left message for patient that Rx has been sent in.

## 2014-11-23 NOTE — Telephone Encounter (Signed)
Patient called stating he will run out of amantadine (SYMMETREL) 100 MG capsule and trihexyphenidyl (ARTANE) 2 MG tablet today. He is requesting enough refill to last until mail order comes in, it was mailed out yesterday. Please call to [facility] on [address]. Please call and advise at [phone] and [phone].

## 2014-11-23 NOTE — Addendum Note (Signed)
Addended by: Crisoforo Oxford on: 11/23/2014 03:13 PM   Modules accepted: Orders

## 2014-11-23 NOTE — Telephone Encounter (Signed)
I e-scribed the Rx to [facility].

## 2014-11-23 NOTE — Telephone Encounter (Signed)
Pharmacy is calling and need to get clarification for trihexyphenidyl (ARTANE) 2 MG tablet. Please call [phone]

## 2014-12-26 ENCOUNTER — Other Ambulatory Visit: Payer: Self-pay | Admitting: Neurology

## 2014-12-27 ENCOUNTER — Telehealth: Payer: Self-pay | Admitting: Neurology

## 2014-12-27 MED ORDER — TRIHEXYPHENIDYL HCL 2 MG PO TABS: 1.0000 mg | ORAL_TABLET | Freq: Three times a day (TID) | ORAL | Status: DC

## 2014-12-27 MED ORDER — AMANTADINE HCL 100 MG PO CAPS: 100.0000 mg | ORAL_CAPSULE | Freq: Two times a day (BID) | ORAL | Status: DC

## 2014-12-27 NOTE — Telephone Encounter (Signed)
Rx's have been sent.  Receipt confirmed by pharmacy.   

## 2014-12-27 NOTE — Telephone Encounter (Signed)
Spouse called to request refill of amantadine (SYMMETREL) 100 MG capsule 90 day supply to [facility] and trihexyphenidyl (ARTANE) 2 MG tablet 90 day supply to [facility]. Appointment scheduled for 01/12/15.

## 2015-01-12 ENCOUNTER — Ambulatory Visit (INDEPENDENT_AMBULATORY_CARE_PROVIDER_SITE_OTHER): Payer: Medicare Other | Admitting: Neurology

## 2015-01-12 ENCOUNTER — Encounter: Payer: Self-pay | Admitting: Neurology

## 2015-01-12 VITALS — BP 114/60 | HR 72 | Resp 16 | Ht 66.5 in | Wt 175.0 lb

## 2015-01-12 DIAGNOSIS — G20 Parkinson's disease: Secondary | ICD-10-CM | POA: Diagnosis not present

## 2015-01-12 DIAGNOSIS — G20A1 Parkinson's disease without dyskinesia, without mention of fluctuations: Secondary | ICD-10-CM

## 2015-01-12 MED ORDER — CARBIDOPA-LEVODOPA-ENTACAPONE 37.5-150-200 MG PO TABS: 1.0000 | ORAL_TABLET | Freq: Four times a day (QID) | ORAL | Status: DC

## 2015-01-12 MED ORDER — TRIHEXYPHENIDYL HCL 2 MG PO TABS: 1.0000 mg | ORAL_TABLET | Freq: Three times a day (TID) | ORAL | Status: DC

## 2015-01-12 MED ORDER — AMANTADINE HCL 100 MG PO CAPS: 100.0000 mg | ORAL_CAPSULE | Freq: Two times a day (BID) | ORAL | Status: DC

## 2015-01-12 NOTE — Patient Instructions (Signed)
We will monitor your mood symptoms and memory symptoms.   We will keep your medications the same.  Drink more water and exercise more.
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
## 2015-04-06 ENCOUNTER — Telehealth: Payer: Self-pay | Admitting: Neurology

## 2015-04-06 DIAGNOSIS — G20 Parkinson's disease: Secondary | ICD-10-CM

## 2015-04-06 MED ORDER — CARBIDOPA-LEVODOPA 25-100 MG PO TABS: 1.5000 | ORAL_TABLET | Freq: Four times a day (QID) | ORAL | Status: DC

## 2015-04-06 MED ORDER — ENTACAPONE 200 MG PO TABS: 200.0000 mg | ORAL_TABLET | Freq: Four times a day (QID) | ORAL | Status: DC

## 2015-04-06 NOTE — Telephone Encounter (Signed)
What do you recommend?

## 2015-04-06 NOTE — Telephone Encounter (Signed)
We can change from Stalevo 150 mg 1 pill 4 times a day to: Sinemet 25-100 milligrams strength 1-1/2 pills 4 times a day and Comtan 200 mg strength one pill 4 times a day. Rx entered for 90 d to [facility]. pls notify patient.

## 2015-04-06 NOTE — Telephone Encounter (Signed)
Wife called to advise carbidopa-levodopa-entacapone (STALEVO) 37.5-150-200 MG tablet costs $136.96 through [facility] for a 90 day supply, Pharmacy advised if Rx is broken up into 2 pills equal to same strength, costs would be $50 for a 90 day supply. Wife states they are on a fixed income and this would help them.

## 2015-04-10 NOTE — Telephone Encounter (Signed)
LM on VM stating that we have received call about medicaiton concerns for [place]. Dr. Frances Furbish has changed Rx and sent those in last week to [facility]. Left call back number for questions.

## 2015-05-02 ENCOUNTER — Telehealth: Payer: Self-pay | Admitting: Neurology

## 2015-05-02 DIAGNOSIS — G20 Parkinson's disease: Secondary | ICD-10-CM

## 2015-05-02 MED ORDER — TRIHEXYPHENIDYL HCL 2 MG PO TABS: 1.0000 mg | ORAL_TABLET | Freq: Three times a day (TID) | ORAL | Status: DC

## 2015-05-02 NOTE — Telephone Encounter (Signed)
Pt called requesting refill for trihexyphenidyl (ARTANE) 2 MG tablet  90 day supply. Pt sts he is taking 1-1/2 tab 3 x day as directed by Dr Frances Furbish. He said [facility] told him he should have medication by last Thursday. He called back today and was told they needed new RX with updated directions and qty. Pt said he will be out of medication tomorrow. He needs 1 mth supply called [facility] on [address].

## 2015-05-02 NOTE — Telephone Encounter (Signed)
I sent in [facility] supply to [facility] with refills and sent 30 day supply to his [facility].

## 2015-05-09 NOTE — Telephone Encounter (Addendum)
Pt's is requesting a new rx to [facility]. The last rx sent on 2/28 does not reflect the change in dosage. It should be 1.5 tablets 3 x day. Pt is currently out of medication as well. [facility] will not give more medication until new rx is sent in. Please call [phone] to give update

## 2015-05-09 NOTE — Telephone Encounter (Signed)
I do not see anything in his notes about increasing Artane to 1.5 tabs 3 times a day. Can you advise?

## 2015-05-09 NOTE — Telephone Encounter (Signed)
Artane 2 mg strength is supposed to be 1/2 pill three times a day as reflected in my note from 11/16 and Rx done for 90 days on 05/01/15.  Please advise patient again.

## 2015-05-10 MED ORDER — TRIHEXYPHENIDYL HCL 2 MG PO TABS: 1.0000 mg | ORAL_TABLET | Freq: Three times a day (TID) | ORAL | Status: DC

## 2015-05-10 NOTE — Telephone Encounter (Signed)
I spoke to patient and re-explained how he is to take his medications. He has not received [facility] yet, he told them not to send Rx until we approve it. I tried calling [facility] but had trouble getting through. I [facility] and [facility]. Patient voiced understanding.

## 2015-05-10 NOTE — Addendum Note (Signed)
Addended by: Crisoforo OxfordURNER, DIANA S on: 05/10/2015 08:51 AM   Modules accepted: Orders

## 2015-06-20 ENCOUNTER — Telehealth: Payer: Self-pay | Admitting: Neurology

## 2015-06-20 NOTE — Telephone Encounter (Signed)
I have faxed the form to the fax number provided on form. I talked to patient and he is aware that I have done so.

## 2015-06-20 NOTE — Telephone Encounter (Signed)
Pt's wife called to see if paper work from  [facility] has been filled out. It pertains to pt's parkinson's disease.  She also called to see if medication carbidopa-levodopa (SINEMET IR) 25-100 MG tablet was faxed to pharmacy. I looked and it was done 04/06/15. Wife also states there is a fax number on form that it needs to be sent to she is requesting a confirmation that it has been done. Please call and advise [phone]

## 2016-01-23 ENCOUNTER — Other Ambulatory Visit: Payer: Self-pay | Admitting: Neurology

## 2016-01-23 DIAGNOSIS — G20 Parkinson's disease: Secondary | ICD-10-CM

## 2016-01-30 ENCOUNTER — Encounter (INDEPENDENT_AMBULATORY_CARE_PROVIDER_SITE_OTHER): Payer: Self-pay | Admitting: Orthopaedic Surgery

## 2016-01-30 ENCOUNTER — Ambulatory Visit (INDEPENDENT_AMBULATORY_CARE_PROVIDER_SITE_OTHER): Payer: Medicare Other

## 2016-01-30 ENCOUNTER — Ambulatory Visit (INDEPENDENT_AMBULATORY_CARE_PROVIDER_SITE_OTHER): Payer: Medicare Other | Admitting: Orthopaedic Surgery

## 2016-01-30 VITALS — BP 125/60 | Ht 67.0 in | Wt 177.0 lb

## 2016-01-30 DIAGNOSIS — M545 Low back pain: Secondary | ICD-10-CM

## 2016-01-30 DIAGNOSIS — G20 Parkinson's disease: Secondary | ICD-10-CM

## 2016-01-30 DIAGNOSIS — M25551 Pain in right hip: Secondary | ICD-10-CM

## 2016-01-30 MED ORDER — OXYCODONE-ACETAMINOPHEN 5-325 MG PO TABS: 1.0000 | ORAL_TABLET | ORAL | 0 refills | Status: DC | PRN

## 2016-01-30 NOTE — Progress Notes (Signed)
Office Visit Note   Patient: Mark Mathews           Date of Birth: 08-Oct-1940           MRN: [id number] Visit Date: 01/30/2016              Requested by: Maurice SmallElaine Griffin, MD [address] [place], [place] [address] PCP: Astrid DivineGRIFFIN,ELAINE COLLINS, MD   Assessment & Plan: Visit Diagnoses:  1. Pain in right hip   2. Acute right-sided low back pain, with sciatica presence unspecified   3. Parkinson's disease (HCC)     Plan: Patient has pain after fall with contusions but x-rays are negative for fracture the pelvis the lumbar spine. He is neurologically intact on exam. We discussed fall prevention, using the handrail when he goes up and down stairs avoiding carrying objects. He has a walker that he can use. Prescription for oxycodone provided for pain. We'll check him back again in 3 weeks. He has increasing problems they will call.  Follow-Up Instructions: Return in about 3 weeks (around 02/20/2016).   Orders:  Orders Placed This Encounter  Procedures  . XR Pelvis 1-2 Views  . XR Lumbar Spine 2-3 Views   Meds ordered this encounter  Medications  . oxyCODONE-acetaminophen (PERCOCET/ROXICET) 5-325 MG tablet    Sig: Take 1-2 tablets by mouth every 4 (four) hours as needed for severe pain.    Dispense:  30 tablet    Refill:  0      Procedures: No procedures performed   Clinical Data: No additional findings.   Subjective: No chief complaint on file.   Patient fell backwards down his stairs on 01/26/2016.  He states that he does not know what he hit, but that when he fell he knocked a hole in the sheetrock.  He is having pain in his right buttocks. He denies any radiculopathy, and states that it is a sharp, stabbing pain that gets him.He is having extreme difficulty with ambulation. He has taken Oxycodone that he had left from an old prescription.   Patient has Parkinson's disease and was walking and was at the top of the stairs. He was not holding the hand rail  fell backwards as above.  Review of Systems  Constitutional: Negative for chills and diaphoresis.  HENT: Negative for ear discharge, ear pain and nosebleeds.   Eyes: Negative for discharge and visual disturbance.  Respiratory: Negative for cough, choking and shortness of breath.   Cardiovascular: Negative for chest pain and palpitations.  Gastrointestinal: Negative for abdominal distention and abdominal pain.  Endocrine: Negative for cold intolerance and heat intolerance.  Genitourinary: Negative for flank pain and hematuria.  Musculoskeletal: Positive for back pain and gait problem.  Skin: Negative for rash and wound.  Neurological: Positive for tremors. Negative for seizures and speech difficulty.       Positive for Parkinson's disease on medication  Hematological: Negative for adenopathy. Does not bruise/bleed easily.  Psychiatric/Behavioral: Negative for agitation and suicidal ideas.     Objective: Vital Signs: BP 125/60   Ht 5\' 7"  (1.702 m)   Wt 177 lb (80.3 kg)   BMI 27.72 kg/m   Physical Exam  Constitutional: He is oriented to person, place, and time. He appears well-developed and well-nourished.  HENT:  Head: Normocephalic and atraumatic.  Eyes: EOM are normal. Pupils are equal, round, and reactive to light.  Neck: No tracheal deviation present. No thyromegaly present.  Cardiovascular: Normal rate.   Pulmonary/Chest: Effort normal.  He has no wheezes.  Abdominal: Soft. Bowel sounds are normal.  Musculoskeletal:  Patient has  Parkinson's tremor of his hands. The pain with hip range of motion these reach full extension. Hip flexion quads are strong. 2-3+ lower extremity reflexes distal pulses are intact. He has bilateral edema which is new since he's been sitting as had pain with walking. Collateral ligaments the knee are stable. Small abrasions over his knees.  Neurological: He is alert and oriented to person, place, and time. He exhibits abnormal muscle tone. Coordination  abnormal.  Skin: Skin is warm and dry. Capillary refill takes less than 2 seconds.  Psychiatric: He has a normal mood and affect. His behavior is normal. Judgment and thought content normal.    Ortho Exam sensation lower extremities are intact negative logroll of his hips. Tenderness over the lower lumbar region and  Specialty Comments:  No specialty comments available.  Imaging: Xr Lumbar Spine 2-3 Views  Result Date: 01/31/2016 Two-view AP and lateral lumbar spine are obtained. Patient has some mild endplate spurring at the thoracolumbar junction no acute compression fracture is visualized no spondylolisthesis. Impression: X-rays were are negative for lumbar fracture, post fall  Xr Pelvis 1-2 Views  Result Date: 01/31/2016 AP pelvis x-rays negative for pelvic fracture. SI joints are normal no hip osteoarthritis. Impression: Normal pelvis x-ray negative for fracture after acute fall    PMFS History: Patient Active Problem List   Diagnosis Date Noted  . Parkinson's disease (HCC) 10/12/2012   Past Medical History:  Diagnosis Date  . Other and unspecified hyperlipidemia   . Paralysis agitans (HCC)   . Parkinson's disease ([facility]CC) 10/12/2012    Family History  Problem Relation Age of Onset  . Heart failure Father     Past Surgical History:  Procedure Laterality Date  . APPENDECTOMY    . CATARACT EXTRACTION  2016   x3  . INGUINAL HERNIA REPAIR Bilateral 03/06/2009  . KIDNEY SURGERY     Social History   Occupational History  .      retired   Social History Main Topics  . Smoking status: Never Smoker  . Smokeless tobacco: Never Used  . Alcohol use No  . Drug use: No  . Sexual activity: Not on file

## 2016-02-06 ENCOUNTER — Telehealth: Payer: Self-pay | Admitting: Neurology

## 2016-02-06 DIAGNOSIS — G20 Parkinson's disease: Secondary | ICD-10-CM

## 2016-02-06 NOTE — Telephone Encounter (Signed)
I called pt to discuss. No answer, left a message asking pt to call me back. 

## 2016-02-06 NOTE — Telephone Encounter (Signed)
Patient requesting refill of amantadine (SYMMETREL) 100 MG capsule. The Rx was denied because the patient needs an appointment but the patient fell down the stairs backwards and made a hole in the wall. He is in a lot of pain and unable to make an appointment at this time. He is being treated at [facility]. The patient uses [facility] on [address]. Please call patient and discuss.

## 2016-02-07 NOTE — Telephone Encounter (Signed)
I called pt again to discuss. No answer, left a message asking him to call me back. 

## 2016-02-08 MED ORDER — AMANTADINE HCL 100 MG PO CAPS: 100.0000 mg | ORAL_CAPSULE | Freq: Two times a day (BID) | ORAL | 3 refills | Status: DC

## 2016-02-08 NOTE — Telephone Encounter (Signed)
Amantadine 100 mg bid, sent to [facility] listed.

## 2016-02-08 NOTE — Addendum Note (Signed)
Addended by: Huston FoleyATHAR, Jarah Pember on: 02/08/2016 02:23 PM   Modules accepted: Orders

## 2016-02-08 NOTE — Telephone Encounter (Signed)
error 

## 2016-02-08 NOTE — Telephone Encounter (Signed)
Pt's wife returned my call. She explained to me that pt fell down the stairs and has hurt his back. This is why he was unable to make an appt before. Pt is completely out of symmetrel and pt's wife wants a refill.  I explained to pt's wife that she will need to schedule an appt for the pt. Pt's wife wanted an appt today. I explained that there are no availabilities today, but offered her an appt on Monday. Pt's wife accepted the 2:00pm office visit on Monday, but wants Dr. Frances FurbishAthar to go ahead and refill pt's symmetrel.  I advised pt's wife that I would send this to Dr. Frances FurbishAthar for review, and then I will call pt's wife back with Dr. Teofilo PodAthar's response.

## 2016-02-08 NOTE — Telephone Encounter (Signed)
I spoke to pt's wife and advised her that Dr. Frances FurbishAthar did refill pt's symmetrel [facility]. Pt's wife verbalized understanding. I reiterated the importance of coming to the appt on Monday, and pt's wife verbalized understanding.

## 2016-02-12 ENCOUNTER — Encounter: Payer: Self-pay | Admitting: Neurology

## 2016-02-12 ENCOUNTER — Telehealth: Payer: Self-pay | Admitting: Diagnostic Neuroimaging

## 2016-02-12 ENCOUNTER — Ambulatory Visit (INDEPENDENT_AMBULATORY_CARE_PROVIDER_SITE_OTHER): Payer: Medicare Other | Admitting: Neurology

## 2016-02-12 VITALS — BP 108/64 | HR 90 | Resp 20 | Ht 67.0 in | Wt 174.0 lb

## 2016-02-12 DIAGNOSIS — K5900 Constipation, unspecified: Secondary | ICD-10-CM

## 2016-02-12 DIAGNOSIS — G20A1 Parkinson's disease without dyskinesia, without mention of fluctuations: Secondary | ICD-10-CM

## 2016-02-12 DIAGNOSIS — Z9181 History of falling: Secondary | ICD-10-CM | POA: Diagnosis not present

## 2016-02-12 DIAGNOSIS — G20 Parkinson's disease: Secondary | ICD-10-CM

## 2016-02-12 MED ORDER — TRIHEXYPHENIDYL HCL 2 MG PO TABS: 1.0000 mg | ORAL_TABLET | Freq: Three times a day (TID) | ORAL | 3 refills | Status: DC

## 2016-02-12 MED ORDER — ENTACAPONE 200 MG PO TABS: 200.0000 mg | ORAL_TABLET | Freq: Every day | ORAL | 3 refills | Status: DC

## 2016-02-12 MED ORDER — AMANTADINE HCL 100 MG PO CAPS: 100.0000 mg | ORAL_CAPSULE | Freq: Two times a day (BID) | ORAL | 3 refills | Status: DC

## 2016-02-12 MED ORDER — CARBIDOPA-LEVODOPA 25-100 MG PO TABS: 1.5000 | ORAL_TABLET | Freq: Every day | ORAL | 3 refills | Status: DC
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
## 2016-02-12 NOTE — Patient Instructions (Addendum)
We will increase the sinemet to 1 1/5 pills 5 times a day and also the entacapone 1 pill 5 times a day.  We will continue with the Amantadine 100 mg 2 times a day and Artane 2 mg is 1/2 pill 3 times a day.  Please start using a walker.  Please do not carry anything while going up or down the stairs and always hold on!  We will see you in 3 months, you can see one of our nurse practitioners and I will see you after that.  Constipation can be a big problem in advanced Parkinson's disease. It is important to be proactive: this includes ensuring adequate water intake and mobilization (walking around), utilizing stool softeners as needed, an over-the-counter laxative as needed up to daily if needed, adding a probiotic in pill form or in the form of yogurt can help as well. Sometimes, using a suppository or enema becomes necessary.

## 2016-03-14 ENCOUNTER — Telehealth: Payer: Self-pay | Admitting: Neurology

## 2016-03-14 DIAGNOSIS — G20 Parkinson's disease: Secondary | ICD-10-CM

## 2016-03-14 NOTE — Telephone Encounter (Signed)
Also per Dr. Frances FurbishAthar, verbal order, ok for home health evaluation.

## 2016-03-14 NOTE — Telephone Encounter (Signed)
Orders placed for home health.
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
## 2016-03-14 NOTE — Telephone Encounter (Signed)
Please advise wife that if the orthopedic doctor feels that an MRI is necessary, they will order it. Having PD does not preclude him from getting an MRI, as a general rule.

## 2016-03-14 NOTE — Addendum Note (Signed)
Addended by: Crisoforo OxfordURNER, Lacreshia Bondarenko S on: 03/14/2016 05:35 PM   Modules accepted: Orders

## 2016-03-14 NOTE — Telephone Encounter (Signed)
LM with information below. Also stated that we will send in a [facility] for [facility] needs.

## 2016-03-14 NOTE — Telephone Encounter (Signed)
Patients wife Lanora Manislizabeth is calling in reference to patient declining.  Patient has had a fall in the kitchen after losing his balance, per patient Orthopedics physician they said patient can not have MRI due to Parkinson's and unable to lay still.  Wife stated there are ways to do MRI even under sedation.  Lanora Manislizabeth would like a call back please.

## 2016-03-15 ENCOUNTER — Telehealth (INDEPENDENT_AMBULATORY_CARE_PROVIDER_SITE_OTHER): Payer: Self-pay | Admitting: Orthopaedic Surgery

## 2016-03-15 DIAGNOSIS — M545 Low back pain: Secondary | ICD-10-CM

## 2016-03-15 NOTE — Telephone Encounter (Signed)
Please advise 

## 2016-03-15 NOTE — Telephone Encounter (Signed)
Patient's wife Mark Manis(elizabeth) called advised patient is still having a lot of pain in his hip and back from the fall in November. She asked if Dr Ophelia CharterYates will set the patient up to have an MRI. The number to contact her is [phone]

## 2016-03-15 NOTE — Telephone Encounter (Signed)
I called. No one there. Will call again Monday.

## 2016-03-18 NOTE — Addendum Note (Signed)
Addended by: Rogers SeedsYEATTS, Payton Prinsen M on: 03/18/2016 05:59 PM   Modules accepted: Orders

## 2016-03-18 NOTE — Telephone Encounter (Signed)
Called again, no answer

## 2016-03-18 NOTE — Telephone Encounter (Signed)
I attempted to call patient to advise MRI order entered and schedule ROV. Machine picks up that asks you to call again.

## 2016-03-18 NOTE — Telephone Encounter (Signed)
Called again - left voice mail.

## 2016-03-18 NOTE — Telephone Encounter (Signed)
Order MRI lumbar. I talked with him on phone. Still with LBP,   Order Lumbar MRI midline pain after fall in Nov. 2017.  ROV after scan

## 2016-03-18 NOTE — Telephone Encounter (Signed)
Order entered for MRI 

## 2016-03-22 ENCOUNTER — Telehealth (INDEPENDENT_AMBULATORY_CARE_PROVIDER_SITE_OTHER): Payer: Self-pay | Admitting: *Deleted

## 2016-03-22 MED ORDER — DIAZEPAM 5 MG PO TABS: ORAL_TABLET | ORAL | 0 refills | Status: DC

## 2016-03-22 NOTE — Telephone Encounter (Signed)
Valium  call in two tabs of 5 mg

## 2016-03-22 NOTE — Telephone Encounter (Signed)
Pt wife called stating pt has upcoming appt for MRI and wants to know if can get something to relax pt when he goes for his MRI.   [facility]  CB# [phone]

## 2016-03-22 NOTE — Telephone Encounter (Signed)
Please advise 

## 2016-03-23 NOTE — Telephone Encounter (Signed)
I called to pharmacy. I called patient's wife and advised.

## 2016-04-01 ENCOUNTER — Ambulatory Visit
Admission: RE | Admit: 2016-04-01 | Discharge: 2016-04-01 | Disposition: A | Discharge status: Home / Self Care | Payer: Medicare Other | Source: Ambulatory Visit | Attending: Orthopaedic Surgery | Admitting: Orthopaedic Surgery

## 2016-04-01 DIAGNOSIS — M545 Low back pain: Secondary | ICD-10-CM

## 2016-04-08 ENCOUNTER — Telehealth (INDEPENDENT_AMBULATORY_CARE_PROVIDER_SITE_OTHER): Payer: Self-pay | Admitting: *Deleted

## 2016-04-08 NOTE — Telephone Encounter (Signed)
Patient's wife called in this morning in regards to the results of her husband's MRI. She hasn't heard from anyone with results he had the MRI done last Monday. Her CB # [phone]. She would greatly appreciate a callback today please. Thank you

## 2016-04-08 NOTE — Telephone Encounter (Signed)
Wife wants him seen ASAP,  Her cell is [phone]. Work in this week. Wife Dx with met breast CA with bone mets and back is bothering her pulling him up.   Send [facility]  PT 3X a week times 4 wks and bath aide 3X a week times 4 wks. Also . thanks

## 2016-04-08 NOTE — Telephone Encounter (Signed)
[facility] [phone].  Cassandra-Can you please make patient and appt for tomorrow at start of afternoon clinic? Patient's wife is aware.  Thanks.

## 2016-04-08 NOTE — Telephone Encounter (Signed)
Please advise. Patient is not scheduled for ROV.

## 2016-04-09 ENCOUNTER — Ambulatory Visit (INDEPENDENT_AMBULATORY_CARE_PROVIDER_SITE_OTHER): Payer: Medicare Other

## 2016-04-09 ENCOUNTER — Encounter (INDEPENDENT_AMBULATORY_CARE_PROVIDER_SITE_OTHER): Payer: Self-pay | Admitting: Orthopaedic Surgery

## 2016-04-09 ENCOUNTER — Ambulatory Visit (INDEPENDENT_AMBULATORY_CARE_PROVIDER_SITE_OTHER): Payer: Medicare Other | Admitting: Orthopaedic Surgery

## 2016-04-09 VITALS — BP 107/67 | HR 80 | Ht 67.0 in | Wt 177.0 lb

## 2016-04-09 DIAGNOSIS — M545 Low back pain: Secondary | ICD-10-CM | POA: Diagnosis not present

## 2016-04-09 DIAGNOSIS — S32030A Wedge compression fracture of third lumbar vertebra, initial encounter for closed fracture: Secondary | ICD-10-CM | POA: Insufficient documentation

## 2016-04-09 DIAGNOSIS — M25552 Pain in left hip: Secondary | ICD-10-CM

## 2016-04-09 DIAGNOSIS — M25522 Pain in left elbow: Secondary | ICD-10-CM | POA: Diagnosis not present

## 2016-04-09 DIAGNOSIS — S32030D Wedge compression fracture of third lumbar vertebra, subsequent encounter for fracture with routine healing: Secondary | ICD-10-CM | POA: Diagnosis not present
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
## 2016-04-10 ENCOUNTER — Telehealth (INDEPENDENT_AMBULATORY_CARE_PROVIDER_SITE_OTHER): Payer: Self-pay | Admitting: *Deleted

## 2016-04-10 NOTE — Telephone Encounter (Signed)
Please advise.  [facility] and they are unable to see patient due to staffing.

## 2016-04-10 NOTE — Telephone Encounter (Signed)
Noted.   [facility] [place]

## 2016-04-10 NOTE — Telephone Encounter (Signed)
Mark HughsKecia from [facility] called stating she received Start of care on this pt and wanted to let you know if will be greater then 48 hr turn around to see pt, will not be able to open until Feb 9th and wants to know will this be ok?  Please call if ok to open or give to another agency  Call back [phone]

## 2016-04-11 ENCOUNTER — Telehealth (INDEPENDENT_AMBULATORY_CARE_PROVIDER_SITE_OTHER): Payer: Self-pay

## 2016-04-11 NOTE — Telephone Encounter (Signed)
Talked with Audree CamelKeica and advised her that Patient wife talked with Dr.Yates and [facility] patient.

## 2016-04-26 ENCOUNTER — Telehealth (INDEPENDENT_AMBULATORY_CARE_PROVIDER_SITE_OTHER): Payer: Self-pay | Admitting: Orthopaedic Surgery

## 2016-04-26 NOTE — Telephone Encounter (Signed)
Need verbal order.   Patient declined the home health but the eval and treat will need order for OT [phone]

## 2016-04-29 NOTE — Telephone Encounter (Signed)
I called [name] and advised.  

## 2016-04-29 NOTE — Telephone Encounter (Signed)
OK - thanks

## 2016-04-29 NOTE — Telephone Encounter (Signed)
ok for order

## 2016-05-03 ENCOUNTER — Telehealth (INDEPENDENT_AMBULATORY_CARE_PROVIDER_SITE_OTHER): Payer: Self-pay | Admitting: Orthopaedic Surgery

## 2016-05-03 MED ORDER — NAPROXEN 500 MG PO TABS: 500.0000 mg | ORAL_TABLET | Freq: Two times a day (BID) | ORAL | 0 refills | Status: DC

## 2016-05-03 NOTE — Telephone Encounter (Signed)
Please advise 

## 2016-05-03 NOTE — Telephone Encounter (Signed)
I left voicemail for patient's wife advising script for Naproxen sent to pharmacy.

## 2016-05-03 NOTE — Addendum Note (Signed)
Addended by: Rogers SeedsYEATTS, Seichi Kaufhold M on: 05/03/2016 05:18 PM   Modules accepted: Orders

## 2016-05-03 NOTE — Telephone Encounter (Signed)
Naproxen 500mg  one po bid prn # 40

## 2016-05-03 NOTE — Telephone Encounter (Signed)
Patients wife called asking for inflammation medication for her husband so he won't have to continue taking 8 Advil a day. CB # [phone]

## 2016-05-08 ENCOUNTER — Ambulatory Visit (INDEPENDENT_AMBULATORY_CARE_PROVIDER_SITE_OTHER): Payer: Medicare Other | Admitting: Surgery

## 2016-05-14 ENCOUNTER — Ambulatory Visit: Payer: Medicare Other | Admitting: Adult Health

## 2016-05-22 ENCOUNTER — Telehealth (INDEPENDENT_AMBULATORY_CARE_PROVIDER_SITE_OTHER): Payer: Self-pay | Admitting: Orthopaedic Surgery

## 2016-05-22 NOTE — Telephone Encounter (Signed)
Ok for verbal 

## 2016-05-22 NOTE — Telephone Encounter (Signed)
[facility] wanting a verbal order from Dr. Ophelia CharterYates for 2 times a week for 4 weeks. CB # [phone]

## 2016-05-22 NOTE — Telephone Encounter (Signed)
Call pts wife and see if she wants it. Previous call from pt did not want HH

## 2016-05-24 NOTE — Telephone Encounter (Signed)
I left voicemail for [name]. Patient had agreed to OT but no Home Health. If order is for continued OT, verbal ok.

## 2016-06-07 ENCOUNTER — Ambulatory Visit (INDEPENDENT_AMBULATORY_CARE_PROVIDER_SITE_OTHER): Payer: Self-pay

## 2016-06-07 ENCOUNTER — Encounter (INDEPENDENT_AMBULATORY_CARE_PROVIDER_SITE_OTHER): Payer: Self-pay | Admitting: Orthopaedic Surgery

## 2016-06-07 ENCOUNTER — Ambulatory Visit (INDEPENDENT_AMBULATORY_CARE_PROVIDER_SITE_OTHER): Payer: Medicare Other | Admitting: Orthopaedic Surgery

## 2016-06-07 DIAGNOSIS — S32030D Wedge compression fracture of third lumbar vertebra, subsequent encounter for fracture with routine healing: Secondary | ICD-10-CM
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
## 2016-06-19 ENCOUNTER — Telehealth (INDEPENDENT_AMBULATORY_CARE_PROVIDER_SITE_OTHER): Payer: Self-pay | Admitting: *Deleted

## 2016-06-19 NOTE — Telephone Encounter (Signed)
Pt spouse called asking about a form that was dropped off and put in yates box to fill out from the city of [place] for trash collection. Pt spouse asking for a call back about this form.

## 2016-06-20 NOTE — Telephone Encounter (Signed)
Can you please try this patient's wife later today and ask the question below. The form is to get special garbage collection services for she and her husband. I have the form with me in [place] office for him to sign, so I can fax once back in [place].  Thanks.

## 2016-06-20 NOTE — Telephone Encounter (Signed)
I tried to reach patient's wife. No answer. I need to know if she wants her health information disclosed on the form where it asks how many people live in the household and why they are unable to help. She is not our patient. Form has been completed at the top for Mr. Milbourne and Dr. Ophelia Charter has signed. Once I have her answer, I can fax in.

## 2016-06-20 NOTE — Telephone Encounter (Signed)
Patient has Parkinson's, and compression fracture of the spine, and Mark Mathews has metastatic breast cancer (has spread to the spine)  She gives the verbal authorization for [place] to add this info to the form and fax it back to her. They are the only 2 people that live in the house.

## 2016-06-24 NOTE — Telephone Encounter (Signed)
Form completed and faxed.  I called patient's wife to advise.

## 2016-07-12 ENCOUNTER — Telehealth (INDEPENDENT_AMBULATORY_CARE_PROVIDER_SITE_OTHER): Payer: Self-pay | Admitting: Orthopaedic Surgery

## 2016-07-12 NOTE — Telephone Encounter (Signed)
Jeneen RinksErin Neely (PT) with [facility] called advised patient is finishing HH (PT) today. Patient asked if he could continue with outpatient (PT). [name] asked if Dr Ophelia CharterYates would write a Rx for out patient (PT)  Mark Mathews said it is OK to contact patient.  The number to contact Mark Mathews is [phone]

## 2016-07-12 NOTE — Telephone Encounter (Signed)
Please advise. OK to send for outpatient therapy?

## 2016-07-15 NOTE — Telephone Encounter (Signed)
OK for outpt PT,  thanks  ROV one month thanks

## 2016-07-15 NOTE — Telephone Encounter (Signed)
fyi I called and spoke with patient's wife. She said that patient is not able to do outpatient therapy at this time and he needs a couple of weeks break. She says he has a very hard time getting up, ready, and going and that she does not know that he is going to be able to do that.

## 2016-07-16 NOTE — Telephone Encounter (Signed)
noted 

## 2016-07-16 NOTE — Telephone Encounter (Signed)
Fine when I saw him I discussed working on walking at home. OK if he does not do OPPT at this time. Just work on walking more each day.

## 2016-08-01 ENCOUNTER — Ambulatory Visit: Payer: Medicare Other | Admitting: Neurology

## 2016-08-30 ENCOUNTER — Emergency Department (HOSPITAL_COMMUNITY): Payer: Medicare Other

## 2016-08-30 ENCOUNTER — Encounter (HOSPITAL_COMMUNITY): Payer: Self-pay | Admitting: *Deleted

## 2016-08-30 ENCOUNTER — Emergency Department (HOSPITAL_COMMUNITY)
Admission: EM | Admit: 2016-08-30 | Discharge: 2016-08-30 | Disposition: A | Discharge status: Home / Self Care | Payer: Medicare Other | Attending: Emergency Medicine | Admitting: Emergency Medicine

## 2016-08-30 DIAGNOSIS — G20 Parkinson's disease: Secondary | ICD-10-CM | POA: Insufficient documentation

## 2016-08-30 DIAGNOSIS — R42 Dizziness and giddiness: Secondary | ICD-10-CM | POA: Insufficient documentation

## 2016-08-30 HISTORY — DX: Spinal stenosis, site unspecified: M48.00

## 2016-08-30 LAB — URINALYSIS, ROUTINE W REFLEX MICROSCOPIC
BILIRUBIN URINE: NEGATIVE
GLUCOSE, UA: NEGATIVE mg/dL
Hgb urine dipstick: NEGATIVE
KETONES UR: NEGATIVE mg/dL
LEUKOCYTES UA: NEGATIVE
NITRITE: NEGATIVE
PH: 5 (ref 5.0–8.0)
PROTEIN: NEGATIVE mg/dL
Specific Gravity, Urine: 1.009 (ref 1.005–1.030)

## 2016-08-30 LAB — BASIC METABOLIC PANEL
Anion gap: 6 (ref 5–15)
BUN: 13 mg/dL (ref 6–20)
CO2: 28 mmol/L (ref 22–32)
CREATININE: 0.97 mg/dL (ref 0.61–1.24)
Calcium: 8.8 mg/dL — ABNORMAL LOW (ref 8.9–10.3)
Chloride: 105 mmol/L (ref 101–111)
GFR calc Af Amer: >60 mL/min (ref >60)
Glucose, Bld: 99 mg/dL (ref 65–99)
POTASSIUM: 3.6 mmol/L (ref 3.5–5.1)
Sodium: 139 mmol/L (ref 135–145)

## 2016-08-30 LAB — CBC
HEMATOCRIT: 42.3 % (ref 39.0–52.0)
Hemoglobin: 13.8 g/dL (ref 13.0–17.0)
MCH: 31 pg (ref 26.0–34.0)
MCHC: 32.6 g/dL (ref 30.0–36.0)
MCV: 95.1 fL (ref 78.0–100.0)
PLATELETS: 204 10*3/uL (ref 150–400)
RBC: 4.45 MIL/uL (ref 4.22–5.81)
RDW: 12.6 % (ref 11.5–15.5)
WBC: 6.1 10*3/uL (ref 4.0–10.5)

## 2016-08-30 LAB — CBG MONITORING, ED: Glucose-Capillary: 90 mg/dL (ref 65–99)

## 2016-08-30 MED ORDER — MECLIZINE HCL 12.5 MG PO TABS: 12.5000 mg | ORAL_TABLET | Freq: Three times a day (TID) | ORAL | 0 refills | Status: DC | PRN

## 2016-08-30 NOTE — Discharge Instructions (Signed)
We believe your symptoms were caused by benign vertigo.  Please read through the included information and take any prescribed medication(s).  Follow up with your doctor as listed above.  If you develop any new or worsening symptoms that concern you, including but not limited to persistent dizziness/vertigo, numbness or weakness in your arms or legs, altered mental status, persistent vomiting, or fever greater than 101, please return immediately to the Emergency Department.  

## 2016-08-30 NOTE — ED Notes (Signed)
Pt transported to MRI 

## 2016-08-30 NOTE — ED Notes (Signed)
ED Provider at bedside. 

## 2016-08-30 NOTE — ED Provider Notes (Signed)
Emergency Department Provider Note   I have reviewed the triage vital signs and the nursing notes.   HISTORY  Chief Complaint Dizziness   HPI Mark Mathews is a 76 y.o. male with PMH of Parkinson's disease and compression fracture in the lumbar spine presents to the emergency department for evaluation of intermittent vertigo over the past 2 weeks it worsened significantly this morning. Patient denies any prior history of vertigo. He's had multiple falls over the past several months because of his Parkinson's disease and fracture to the lumbar spine. He undergone physical therapy for several months and was improving. 2 weeks ago his vertigo started intermittently and seems to have worsened significantly this morning. He describes no association with head turning or movement. Sometimes he sitting in bed and has severe vertigo that resolves without intervention. This morning the patient had severe vertigo when getting out of bed and fell to the ground striking his right knee. He describes pain in that area. No chest pain, palpitations, shortness of breath. No ear pain or tinnitus. No changes to his Parkinson's medications.   Past Medical History:  Diagnosis Date  . Other and unspecified hyperlipidemia   . Paralysis agitans (HCC)   . Parkinson's disease (HCC) 10/12/2012  . Spinal stenosis     Patient Active Problem List   Diagnosis Date Noted  . Closed compression fracture of L3 lumbar vertebra (HCC) 04/09/2016  . Parkinson's disease (HCC) 10/12/2012    Past Surgical History:  Procedure Laterality Date  . APPENDECTOMY    . CATARACT EXTRACTION  2016   x3  . INGUINAL HERNIA REPAIR Bilateral 03/06/2009  . KIDNEY SURGERY      Current Outpatient Rx  . Order #: [id number] Class: Normal  . Order #: [id number] Class: Normal  . Order #: [id number] Class: Normal  . Order #: [id number] Class: Historical Med  . Order #: [id number] Class: Normal  . Order #: [id number] Class: Print  . Order #:  [id number] Class: Print  . Order #: [id number] Class: Normal    Allergies Patient has no known allergies.  Family History  Problem Relation Age of Onset  . Heart failure Father     Social History Social History  Substance Use Topics  . Smoking status: Never Smoker  . Smokeless tobacco: Never Used  . Alcohol use No    Review of Systems  Constitutional: No fever/chills. Positive intermittent vertigo.  Eyes: No visual changes. ENT: No sore throat. Cardiovascular: Denies chest pain. Respiratory: Denies shortness of breath. Gastrointestinal: No abdominal pain.  No nausea, no vomiting.  No diarrhea.  No constipation. Genitourinary: Negative for dysuria. Musculoskeletal: Positive for chronic back pain. Skin: Negative for rash. Neurological: Negative for headaches, focal weakness or numbness. Positive unsteady gait.   10-point ROS otherwise negative.  ____________________________________________   PHYSICAL EXAM:  VITAL SIGNS: ED Triage Vitals [08/30/16 0804]  Enc Vitals Group     BP 129/72     Pulse Rate 76     Resp 16     Temp 98.1 F (36.7 C)     Temp Source Oral     SpO2 97 %     Weight 166 lb (75.3 kg)     Height 5\' 7"  (1.702 m)   Constitutional: Alert and oriented. Well appearing and in no acute distress. Eyes: Conjunctivae are normal. PERRL. EOMI. Head: Atraumatic. Ears:  Healthy appearing ear canals and TMs bilaterally Nose: No congestion/rhinnorhea. Mouth/Throat: Mucous membranes are moist.  Neck: No stridor.  Cardiovascular: Normal rate, regular rhythm. Good peripheral circulation. Grossly  normal heart sounds.   Respiratory: Normal respiratory effort.  No retractions. Lungs CTAB. Gastrointestinal: Soft and nontender. No distention.  Musculoskeletal: No lower extremity tenderness nor edema. No gross deformities of extremities. Neurologic:  Normal speech and language. Patient with course intention tremor with finger to nose testing bilaterally. No CN  deficits 2-12. No pronator drift.  Skin:  Skin is warm, dry and intact. No rash noted.  ____________________________________________   LABS (all labs ordered are listed, but only abnormal results are displayed)  Labs Reviewed  BASIC METABOLIC PANEL - Abnormal; Notable for the following:       Result Value   Calcium 8.8 (*)    All other components within normal limits  URINALYSIS, ROUTINE W REFLEX MICROSCOPIC - Abnormal; Notable for the following:    Color, Urine AMBER (*)    All other components within normal limits  CBC  CBG MONITORING, ED   ____________________________________________  EKG   EKG Interpretation  Date/Time:  Friday August 30 2016 08:03:43 EDT Ventricular Rate:  75 PR Interval:  158 QRS Duration: 96 QT Interval:  376 QTC Calculation: 419 R Axis:   -42 Text Interpretation:  Normal sinus rhythm Left axis deviation Incomplete right bundle branch block Possible Lateral infarct , age undetermined Abnormal ECG No STEMI. No old tracing for comparison.  Confirmed by Alona Bene [phone]) on 08/30/2016 10:19:05 AM       ____________________________________________  RADIOLOGY  Mr Brain Wo Contrast  Result Date: 08/30/2016 CLINICAL DATA:  Vertigo with recent falls.  History Parkinson's. EXAM: MRI HEAD WITHOUT CONTRAST TECHNIQUE: Multiplanar, multiecho pulse sequences of the brain and surrounding structures were obtained without intravenous contrast. COMPARISON:  None. FINDINGS: Brain: Mild atrophy. Negative for hydrocephalus. Negative for acute infarct. Periventricular and deep white matter hyperintensities most consistent with chronic microvascular ischemia. Asymmetric hyperintensity in the right occipital white matter. Bulbous occipital horn on the right may be related to scarring in the occipital horn. Hyperintensity in the brachium pontis on the right may also be due to chronic ischemia. Negative for hemorrhage or mass lesion. Vascular: Normal arterial flow void Skull  and upper cervical spine: Negative Sinuses/Orbits: Mild mucosal edema paranasal sinuses. Bilateral lens replacement. Other: None IMPRESSION: Negative for acute infarct. Diffuse cerebral white matter changes. Focal hyperintensity right brachium pontis. These lesions are most likely related to chronic microvascular ischemia Electronically Signed   By: Marlan Palau M.D.   On: 08/30/2016 11:23   Dg Knee Complete 4 Views Right  Result Date: 08/30/2016 CLINICAL DATA:  FELL THIS AM.PAIN/SORENESS ANTERIOR RIGHT KNEE EXAM: RIGHT KNEE - COMPLETE 4+ VIEW COMPARISON:  None. FINDINGS: Negative for fracture or dislocation. No effusion. Marginal spurs from the patellar articular surface. Normal mineralization and alignment. IMPRESSION: 1. No acute findings. 2. Small patellar spurs. Electronically Signed   By: Corlis Leak M.D.   On: 08/30/2016 08:41    ____________________________________________   PROCEDURES  Procedure(s) performed:   Procedures  None ____________________________________________   INITIAL IMPRESSION / ASSESSMENT AND PLAN / ED COURSE  Pertinent labs & imaging results that were available during my care of the patient were reviewed by me and considered in my medical decision making (see chart for details).  Patient's neurological exam is complicated by the patient's coarse tremor. He has full range of motion of bilateral hips and knees. X-ray performed from triage of the right knee is negative for fracture or dislocation. Symptoms are intermittent but not clearly associated with movement. No ear pain or ringing in the ears. I  have lower suspicion for central vertigo but the patient's underlying neurological problems make it difficult to interpret his neuro exam with confidence. Plan to obtain MRI brain to rule out posterior circulation stroke.   MRI negative. Plan for meclizine and PCP/ENT follow up. Patient orthostatic vitals are unremarkable.   At this time, I do not feel there is any  life-threatening condition present. I have reviewed and discussed all results (EKG, imaging, lab, urine as appropriate), exam findings with patient. I have reviewed nursing notes and appropriate previous records.  I feel the patient is safe to be discharged home without further emergent workup. Discussed usual and customary return precautions. Patient and family (if present) verbalize understanding and are comfortable with this plan.  Patient will follow-up with their primary care provider. If they do not have a primary care provider, information for follow-up has been provided to them. All questions have been answered.  ____________________________________________  FINAL CLINICAL IMPRESSION(S) / ED DIAGNOSES  Final diagnoses:  Vertigo     MEDICATIONS GIVEN DURING THIS VISIT:  Medications - No data to display   NEW OUTPATIENT MEDICATIONS STARTED DURING THIS VISIT:  Discharge Medication List as of 08/30/2016 12:18 PM    START taking these medications   Details  meclizine (ANTIVERT) 12.5 MG tablet Take 1 tablet (12.5 mg total) by mouth 3 (three) times daily as needed for dizziness., Starting Fri 08/30/2016, Print        Note:  This document was prepared using Dragon voice recognition software and may include unintentional dictation errors.  Alona BeneJoshua Espn Zeman, MD Emergency Medicine   Jassiel Flye, Arlyss RepressJoshua G, MD 08/30/16 [phone]

## 2016-08-30 NOTE — ED Triage Notes (Signed)
Pt in c/o intermittent dizziness with frequent falls onset x 2 wks, pt hx of Parkinsons, denies hitting head, pt reports R knee pain post fall today, pt A&O x3, disoriented to time per family this is baseline, denies SOB, n/v/d, no facial droop or slurred speech

## 2016-08-30 NOTE — ED Notes (Signed)
Pt returned from MRI °

## 2016-09-05 ENCOUNTER — Ambulatory Visit: Payer: Medicare Other | Admitting: Neurology

## 2016-09-10 ENCOUNTER — Encounter: Payer: Self-pay | Admitting: Neurology

## 2016-09-10 ENCOUNTER — Ambulatory Visit (INDEPENDENT_AMBULATORY_CARE_PROVIDER_SITE_OTHER): Payer: Medicare Other | Admitting: Neurology

## 2016-09-10 VITALS — BP 128/68 | HR 84 | Ht 67.0 in | Wt 163.0 lb

## 2016-09-10 DIAGNOSIS — K5900 Constipation, unspecified: Secondary | ICD-10-CM

## 2016-09-10 DIAGNOSIS — H8110 Benign paroxysmal vertigo, unspecified ear: Secondary | ICD-10-CM

## 2016-09-10 DIAGNOSIS — Z9181 History of falling: Secondary | ICD-10-CM | POA: Diagnosis not present

## 2016-09-10 DIAGNOSIS — G20 Parkinson's disease: Secondary | ICD-10-CM | POA: Diagnosis not present
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
## 2016-09-10 NOTE — Patient Instructions (Addendum)
We will have to be more mindful of your fall risk.  Your posture and mobility have become worse. Add to that, the low back pain, and recent vertigo.  Please remember, that vertigo can recur without warning, triggers could be stress, sleep deprivation, dehydration and even taking a bumpy car ride, sometimes an acute illness, even a common (viral) cold. It can last hours or days. Please change positions slowly and always stay well-hydrated. Physical therapy with particular attention to vestibular rehabilitation can be very helpful. While there is no specific medication that helps with vertigo, some people get relief with as needed use of meclizine. Certain medications can exacerbate vertigo.  I suggest you keep your appointment with Dr. Lazarus SalinesWolicki.   Please be proactive with constipation, need to go every other day at least.

## 2017-02-05 ENCOUNTER — Other Ambulatory Visit: Payer: Self-pay | Admitting: Neurology

## 2017-02-05 DIAGNOSIS — G20 Parkinson's disease: Secondary | ICD-10-CM

## 2017-02-17 ENCOUNTER — Ambulatory Visit (INDEPENDENT_AMBULATORY_CARE_PROVIDER_SITE_OTHER): Payer: Medicare Other | Admitting: Neurology

## 2017-02-17 ENCOUNTER — Encounter: Payer: Self-pay | Admitting: Neurology

## 2017-02-17 DIAGNOSIS — G20 Parkinson's disease: Secondary | ICD-10-CM

## 2017-02-17 MED ORDER — TRIHEXYPHENIDYL HCL 2 MG PO TABS: 1.0000 mg | ORAL_TABLET | Freq: Three times a day (TID) | ORAL | 3 refills | Status: DC

## 2017-02-17 MED ORDER — CARBIDOPA-LEVODOPA 25-100 MG PO TABS: 1.5000 | ORAL_TABLET | Freq: Every day | ORAL | 3 refills | Status: DC

## 2017-02-17 MED ORDER — ENTACAPONE 200 MG PO TABS: 200.0000 mg | ORAL_TABLET | Freq: Every day | ORAL | 3 refills | Status: DC

## 2017-02-17 MED ORDER — AMANTADINE HCL 100 MG PO CAPS: 100.0000 mg | ORAL_CAPSULE | Freq: Two times a day (BID) | ORAL | 3 refills | Status: DC
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
## 2017-02-17 NOTE — Patient Instructions (Addendum)
You have reduced your sinemet (generic name: carbidopa-levodopa) 25/100 mg: when you go home, can you call us with  the way you take them, also the Comtan (generic: entacapone).   I would like to consider a medication for your memory down the road. Let's think about it. Talk it over with your wife as well.

## 2017-03-11 ENCOUNTER — Ambulatory Visit: Payer: Medicare Other | Admitting: Neurology

## 2017-06-18 ENCOUNTER — Ambulatory Visit: Payer: Medicare Other | Admitting: Neurology

## 2017-08-12 ENCOUNTER — Emergency Department (HOSPITAL_COMMUNITY): Payer: Medicare Other

## 2017-08-12 ENCOUNTER — Emergency Department (HOSPITAL_COMMUNITY)
Admission: EM | Admit: 2017-08-12 | Discharge: 2017-08-12 | Disposition: A | Discharge status: Home / Self Care | Payer: Medicare Other | Attending: Emergency Medicine | Admitting: Emergency Medicine

## 2017-08-12 ENCOUNTER — Encounter (HOSPITAL_COMMUNITY): Payer: Self-pay | Admitting: Emergency Medicine

## 2017-08-12 ENCOUNTER — Other Ambulatory Visit: Payer: Self-pay

## 2017-08-12 DIAGNOSIS — M25561 Pain in right knee: Secondary | ICD-10-CM | POA: Diagnosis not present

## 2017-08-12 DIAGNOSIS — Y929 Unspecified place or not applicable: Secondary | ICD-10-CM | POA: Diagnosis not present

## 2017-08-12 DIAGNOSIS — Y9301 Activity, walking, marching and hiking: Secondary | ICD-10-CM | POA: Diagnosis not present

## 2017-08-12 DIAGNOSIS — W0110XA Fall on same level from slipping, tripping and stumbling with subsequent striking against unspecified object, initial encounter: Secondary | ICD-10-CM | POA: Diagnosis not present

## 2017-08-12 DIAGNOSIS — Y999 Unspecified external cause status: Secondary | ICD-10-CM | POA: Insufficient documentation

## 2017-08-12 DIAGNOSIS — Z79899 Other long term (current) drug therapy: Secondary | ICD-10-CM | POA: Diagnosis not present

## 2017-08-12 DIAGNOSIS — M25562 Pain in left knee: Secondary | ICD-10-CM | POA: Insufficient documentation

## 2017-08-12 DIAGNOSIS — W19XXXS Unspecified fall, sequela: Secondary | ICD-10-CM

## 2017-08-12 DIAGNOSIS — G20 Parkinson's disease: Secondary | ICD-10-CM | POA: Insufficient documentation

## 2017-08-12 LAB — CBC WITH DIFFERENTIAL/PLATELET
ABS IMMATURE GRANULOCYTES: 0.1 10*3/uL (ref 0.0–0.1)
BASOS ABS: 0.1 10*3/uL (ref 0.0–0.1)
BASOS PCT: 0 %
EOS ABS: 0 10*3/uL (ref 0.0–0.7)
Eosinophils Relative: 0 %
HCT: 41 % (ref 39.0–52.0)
Hemoglobin: 13.4 g/dL (ref 13.0–17.0)
IMMATURE GRANULOCYTES: 1 %
Lymphocytes Relative: 9 %
Lymphs Abs: 1.1 10*3/uL (ref 0.7–4.0)
MCH: 30.9 pg (ref 26.0–34.0)
MCHC: 32.7 g/dL (ref 30.0–36.0)
MCV: 94.5 fL (ref 78.0–100.0)
Monocytes Absolute: 1 10*3/uL (ref 0.1–1.0)
Monocytes Relative: 8 %
NEUTROS ABS: 9.8 10*3/uL — AB (ref 1.7–7.7)
NEUTROS PCT: 82 %
PLATELETS: 255 10*3/uL (ref 150–400)
RBC: 4.34 MIL/uL (ref 4.22–5.81)
RDW: 11.9 % (ref 11.5–15.5)
WBC: 11.9 10*3/uL — ABNORMAL HIGH (ref 4.0–10.5)

## 2017-08-12 LAB — URINALYSIS, ROUTINE W REFLEX MICROSCOPIC
GLUCOSE, UA: NEGATIVE mg/dL
Ketones, ur: 20 mg/dL — AB
LEUKOCYTES UA: NEGATIVE
Nitrite: NEGATIVE
PROTEIN: 30 mg/dL — AB
SPECIFIC GRAVITY, URINE: 1.027 (ref 1.005–1.030)
pH: 5 (ref 5.0–8.0)

## 2017-08-12 LAB — COMPREHENSIVE METABOLIC PANEL
ALK PHOS: 115 U/L (ref 38–126)
ALT: 5 U/L — AB (ref 17–63)
ANION GAP: 8 (ref 5–15)
AST: 23 U/L (ref 15–41)
Albumin: 3.4 g/dL — ABNORMAL LOW (ref 3.5–5.0)
BUN: 24 mg/dL — ABNORMAL HIGH (ref 6–20)
CALCIUM: 9 mg/dL (ref 8.9–10.3)
CO2: 26 mmol/L (ref 22–32)
CREATININE: 1.22 mg/dL (ref 0.61–1.24)
Chloride: 109 mmol/L (ref 101–111)
GFR, EST NON AFRICAN AMERICAN: 55 mL/min — AB (ref >60)
Glucose, Bld: 100 mg/dL — ABNORMAL HIGH (ref 65–99)
Potassium: 3.5 mmol/L (ref 3.5–5.1)
SODIUM: 143 mmol/L (ref 135–145)
Total Bilirubin: 0.8 mg/dL (ref 0.3–1.2)
Total Protein: 6.7 g/dL (ref 6.5–8.1)

## 2017-08-12 LAB — I-STAT CHEM 8, ED
BUN: 29 mg/dL — AB (ref 6–20)
CHLORIDE: 106 mmol/L (ref 101–111)
Calcium, Ion: 1.15 mmol/L (ref 1.15–1.40)
Creatinine, Ser: 1.1 mg/dL (ref 0.61–1.24)
Glucose, Bld: 95 mg/dL (ref 65–99)
HCT: 40 % (ref 39.0–52.0)
Hemoglobin: 13.6 g/dL (ref 13.0–17.0)
POTASSIUM: 3.6 mmol/L (ref 3.5–5.1)
SODIUM: 143 mmol/L (ref 135–145)
TCO2: 25 mmol/L (ref 22–32)

## 2017-08-12 LAB — I-STAT TROPONIN, ED: TROPONIN I, POC: 0.01 ng/mL (ref 0.00–0.08)

## 2017-08-12 LAB — I-STAT CG4 LACTIC ACID, ED: LACTIC ACID, VENOUS: 1.31 mmol/L (ref 0.5–1.9)

## 2017-08-12 MED ORDER — SODIUM CHLORIDE 0.9 % IV BOLUS: 1000.0000 mL | Freq: Once | INTRAVENOUS | Status: DC

## 2017-08-12 NOTE — ED Provider Notes (Addendum)
[facility] Provider Note   CSN: [id number] Arrival date & time: 08/12/17  1151     History   Chief Complaint Chief Complaint  Patient presents with  . Fall    HPI Mark Mathews is a 77 y.o. male. with parkinson's disease and spinal stenosis who presented with frequent falls.    The patient was brought in by EMS this morning due to the patient falling in his bathroom today on his bilateral knees. The patient states that he has been falling more frequently recently and he states that he does not recall a reason why he falls. His wife notes about 4 falls over the past month.   The patient states that he has been having pain in his lower back and bilateral knees subsequent to his fall this morning. He also has some pain in his right shoulder from fall few weeks ago. Also notes left hip pain.   At baseline the patient is able to feed himself, use a walker to walk 10-20 feet, and he is able to dress himself but it takes 2-3 hours. Over the past 2-3 days the patient has been bed bound.   MRI lumbar spine from 2018 showed L3 vertebral body compression fracture, L4-L5 right foraminal stenosis, l2-l3 bilateral foraminal stenosis.   Past Medical History:  Diagnosis Date  . Other and unspecified hyperlipidemia   . Paralysis agitans ([facility])   . Parkinson's disease ([facility]) 10/12/2012  . Spinal stenosis     Patient Active Problem List   Diagnosis Date Noted  . Closed compression fracture of L3 lumbar vertebra 04/09/2016  . Parkinson's disease ([facility]) 10/12/2012    Past Surgical History:  Procedure Laterality Date  . APPENDECTOMY    . CATARACT EXTRACTION  2016   x3  . INGUINAL HERNIA REPAIR Bilateral 03/06/2009  . KIDNEY SURGERY          Home Medications    Prior to Admission medications   Medication Sig Start Date End Date Taking? Authorizing Provider  amantadine (SYMMETREL) 100 MG capsule Take 1 capsule (100 mg total) by mouth 2 (two) times  daily. 02/17/17   Huston FoleyAthar, Saima, MD  carbidopa-levodopa (SINEMET IR) 25-100 MG tablet Take 1.5 tablets by mouth 5 (five) times daily. In lieu of stalevo 150 mg 02/17/17   Huston FoleyAthar, Saima, MD  entacapone (COMTAN) 200 MG tablet Take 1 tablet (200 mg total) by mouth 5 (five) times daily. In lieu of Stalevo 150 mg 02/17/17   Huston FoleyAthar, Saima, MD  ibuprofen (ADVIL,MOTRIN) 200 MG tablet Take 200 mg by mouth every 6 (six) hours as needed for moderate pain.     [provider]  meclizine (ANTIVERT) 12.5 MG tablet Take 1 tablet (12.5 mg total) by mouth 3 (three) times daily as needed for dizziness. 08/30/16   Long, Arlyss RepressJoshua G, MD  trihexyphenidyl (ARTANE) 2 MG tablet Take 0.5 tablets (1 mg total) by mouth 3 (three) times daily with meals. 02/17/17   Huston FoleyAthar, Saima, MD    Family History Family History  Problem Relation Age of Onset  . Heart failure Father     Social History Social History   Tobacco Use  . Smoking status: Never Smoker  . Smokeless tobacco: Never Used  Substance Use Topics  . Alcohol use: No    Alcohol/week: 0.0 oz  . Drug use: No     Allergies   Patient has no known allergies.   Review of Systems Review of Systems  Eyes: Negative for visual  disturbance.  Respiratory: Positive for cough.   Cardiovascular: Negative for leg swelling.  Musculoskeletal: Positive for back pain and gait problem.   Physical Exam Updated Vital Signs BP 108/82 (BP Location: Right Arm)   Pulse 76   Temp 97.8 F (36.6 C) (Oral)   Resp 17   Ht 5\' 6"  (1.676 m)   Wt 77.6 kg (171 lb)   SpO2 99%   BMI 27.60 kg/m   Physical Exam  Constitutional: He appears well-developed and well-nourished. No distress.  HENT:  Head: Normocephalic and atraumatic.  Eyes: Conjunctivae are normal.  Cardiovascular: Normal rate, regular rhythm and normal heart sounds.  Pulmonary/Chest: Effort normal and breath sounds normal. No stridor. No respiratory distress.  Abdominal: Soft. Bowel sounds are normal. He  exhibits no distension. There is no tenderness.  Musculoskeletal: He exhibits no edema.  2/5 muscle strength in bilateral upper and lower extremities. Spontaneously moves toes on bilateral feet  Neurological: He is alert. A sensory deficit (left lower extremity) is present. No cranial nerve deficit. He exhibits normal muscle tone.  Tremors in bilateral upper extremities  Skin: He is not diaphoretic. There is erythema (right shoulder, bilateral knees).  Purpura noted over right forearm  Psychiatric: He has a normal mood and affect. His behavior is normal. Judgment and thought content normal.    ED Treatments / Results  Labs (all labs ordered are listed, but only abnormal results are displayed) Labs Reviewed  CULTURE, BLOOD (ROUTINE X 2)  CULTURE, BLOOD (ROUTINE X 2)  URINE CULTURE  COMPREHENSIVE METABOLIC PANEL  CBC WITH DIFFERENTIAL/PLATELET  URINALYSIS, ROUTINE W REFLEX MICROSCOPIC  I-STAT CG4 LACTIC ACID, ED    EKG EKG Interpretation  Date/Time:  Tuesday August 12 2017 11:59:35 EDT Ventricular Rate:  75 PR Interval:  148 QRS Duration: 94 QT Interval:  380 QTC Calculation: 424 R Axis:   -34 Text Interpretation:  Normal sinus rhythm Left axis deviation Incomplete right bundle branch block Minimal voltage criteria for LVH, may be normal variant Nonspecific ST and T wave abnormality Abnormal ECG No significant change since last tracing Confirmed by Richardean Canal [phone]) on 08/12/2017 12:13:43 PM   Radiology No results found.  Procedures Procedures (including critical care time)  Medications Ordered in ED Medications  sodium chloride 0.9 % bolus 1,000 mL (has no administration in time range)     Initial Impression / Assessment and Plan / ED Course  I have reviewed the triage vital signs and the nursing notes.  Pertinent labs & imaging results that were available during my care of the patient were reviewed by me and considered in my medical decision making (see chart for  details).  Mark Mathews is a 77 y.o male with parkinsons disease who has been having frequent falls recently and has become bed bound over the past 5 days.   Chest xray did not show any acute cardiopulmonary disease. Pelvic xray is negative for fracture. Lumbar spine xray found chronic compression fracture at l3 level that was unchanged. Right knee xray showed osteoarthritis. Left knee xray was normal. CT head was without acute intracranial abnormality.   The patient's worsening weakness is likely due to progression of parkinson's and he may benefit from nursing home placement. Patient was given 1L normal saline.  Called triad hospitalists to admit patient who consulted social work for support services outside hospital. Home health PT, OT, and nursing orders were placed.  Final Clinical Impressions(s) / ED Diagnoses   Final diagnoses:  None    ED  Discharge Orders    None       Lorenso Courier, MD 08/12/17 1431    Lorenso Courier, MD 08/12/17 1444    Lorenso Courier, MD 08/12/17 1504    Lorenso Courier, MD 08/12/17 1544    Charlynne Pander, MD 08/17/17 [phone]

## 2017-08-12 NOTE — Discharge Planning (Signed)
Shakai Dolley J. Lucretia RoersWood, RN, BSN, [employer] [phone] Spoke with pt and family at bedside regarding discharge planning [facility]. Offered pt list of home health agencies to choose from.  Pt chose [facility] to render services. Ayesha RumpfMary Yonjof, RN of [facility]  notified. Patient made aware that [facility]  will be in contact in 24-48 hours.  No DME needs identified at this time.

## 2017-08-12 NOTE — ED Triage Notes (Signed)
Pt to ED via [facility] from home, with c/o falling more often, fell today, landing on knees, hurting toes also- unable to get up, per wife has been weaker- not drinking much, wife takes care of pt without any outside help.  Pt is alert/oriented x 4, w/d

## 2017-08-12 NOTE — Progress Notes (Signed)
Pt and pt's family stated they would like to be set up with home health. RN CM will follow up with pt and pt's family members about establishing home health.   Montine CircleKelsy Shawnette Augello, Silverio LayLCSWA [facility]  [phone]
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
## 2017-08-12 NOTE — Discharge Instructions (Addendum)
It was a pleasure to take care of you Mr. Mark Mathews. During your ED visit you were evaluated for your dizziness and recent falls. You do not have any acute fractures or displacements of your joints. You did not have any abnormalities noted in your CT head imaging. Please follow with your neurologist and primary care physician in a week. You are being discharged with home health support. Thank you!

## 2017-08-13 LAB — URINE CULTURE

## 2017-08-20 ENCOUNTER — Ambulatory Visit (INDEPENDENT_AMBULATORY_CARE_PROVIDER_SITE_OTHER): Payer: Medicare Other | Admitting: Neurology

## 2017-08-20 ENCOUNTER — Encounter: Payer: Self-pay | Admitting: Neurology

## 2017-08-20 VITALS — BP 98/52 | HR 79 | Ht 67.0 in | Wt 164.0 lb

## 2017-08-20 DIAGNOSIS — G20 Parkinson's disease: Secondary | ICD-10-CM

## 2017-08-20 DIAGNOSIS — R413 Other amnesia: Secondary | ICD-10-CM

## 2017-08-20 DIAGNOSIS — Z9181 History of falling: Secondary | ICD-10-CM | POA: Diagnosis not present

## 2017-08-20 MED ORDER — FLUDROCORTISONE ACETATE 0.1 MG PO TABS: 0.0500 mg | ORAL_TABLET | Freq: Every day | ORAL | 5 refills | Status: DC
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
## 2017-08-20 NOTE — Patient Instructions (Addendum)
I will prescribe florinef as needed for BP values of less than 95/55. Take half a pill once daily if needed. Please get a blood pressure monitor. Not the wrist kind.   You may have dizziness due to taking Parkinson's meds, low BP, and likely autonomic dysfunction, which can be seen in Parkinson's disease. As a result, you may feel dizzy, have blood pressure drops and abnormal temperature sensation, sweating, etc.   We will keep the Sinemet at 1.5 pills 5 times a day and also the Comtan 1 pill 5 times a day.   For low blood issues and dizziness and balance issues: 1. We will taper you off the Amantadine, 100 mg capsule: take once daily for 1 week, then stop.  2. We will then taper you off the Artane 2 mg: 1/2 pill 2 times daily for 1 week, then 1/2 pill once daily for 1 week, then stop.  Please discuss long-term living situation and possible solutions.   I will prescribe a lift chair and a 3 in 1.

## 2017-08-26 ENCOUNTER — Telehealth: Payer: Self-pay

## 2017-08-26 NOTE — Telephone Encounter (Signed)
Pt's wife returned Rn's call. Rn asked to tell her that she will [facility]. Pt's wife said it must have signature.

## 2017-08-26 NOTE — Telephone Encounter (Signed)
I called pt's wife. I explained that I [facility] and they should be able to accept a printed copy of the order without Dr. Frances FurbishAthar hand-signing the order. Pt's wife asked me to fax them the order. I will ask [facility] to contact me with any questions or concerns. Pt's wife verbalized understanding.

## 2017-08-26 NOTE — Telephone Encounter (Signed)
Received a fax from pt's wife requesting that I send an order for pt's lift chair to [facility]. [facility] was going to charge more money.  [facility] called [facility] and got their fax number [phone]).  I called pt's wife to discuss. No answer, left a message asking her to call me back.

## 2017-11-24 ENCOUNTER — Telehealth: Payer: Self-pay | Admitting: Neurology

## 2017-11-24 DIAGNOSIS — G20 Parkinson's disease: Secondary | ICD-10-CM

## 2017-11-24 MED ORDER — TRIHEXYPHENIDYL HCL 2 MG PO TABS: 1.0000 mg | ORAL_TABLET | Freq: Three times a day (TID) | ORAL | 3 refills | Status: DC

## 2017-11-24 NOTE — Telephone Encounter (Signed)
Pt's wife Jana Halflizabeth/DPR said she has tried to taper him off trihexyphenidyl (ARTANE) 2 MG tablet but the parkinson symptoms increased. She increased the salt intake and kept him on the medication at the usual dose. The patient did not ever startfludrocortisone (FLORINEF) 0.1 MG tablet. She is needing refill fortrihexyphenidyl (ARTANE) 2 MG tablet 90 day supply [facility] to [facility].

## 2017-11-24 NOTE — Telephone Encounter (Signed)
I called Mark Mathews's wife, per DPR. She reports that they attempted to wean Mark Mathews off of the artane but his PD symptoms increased and Mark Mathews felt worse. They have increased Mark Mathews's salt in his diet to help with his BP. Mark Mathews has resumed taking artane 0.5 tablet TID and has forgone tapering.  They report that [facility] does not have an active RX on file for the artane and the RX they have been using is from 2017.   Mark Mathews's wife is wondering if it is ok to refill the artane and send to [facility]. I advised her that I would speak to Dr. Frances FurbishAthar.

## 2017-11-24 NOTE — Telephone Encounter (Signed)
Received VO from Dr. Frances FurbishAthar to refill pt's artane. RX sent to [facility].

## 2017-11-24 NOTE — Telephone Encounter (Signed)
Pts wife called stating she did find another bottle for medication trihexyphenidyl (ARTANE) 2 MG tablet FYI

## 2017-12-02 ENCOUNTER — Ambulatory Visit: Payer: Medicare Other | Admitting: Neurology

## 2017-12-02 ENCOUNTER — Telehealth: Payer: Self-pay

## 2017-12-02 NOTE — Telephone Encounter (Signed)
Pt did not show for their appt with Dr. Athar today.  

## 2017-12-03 ENCOUNTER — Encounter: Payer: Self-pay | Admitting: Neurology

## 2018-02-10 ENCOUNTER — Emergency Department (HOSPITAL_COMMUNITY): Payer: Medicare Other

## 2018-02-10 ENCOUNTER — Emergency Department (HOSPITAL_COMMUNITY)
Admission: EM | Admit: 2018-02-10 | Discharge: 2018-02-10 | Disposition: A | Discharge status: Home / Self Care | Payer: Medicare Other | Attending: Emergency Medicine | Admitting: Emergency Medicine

## 2018-02-10 ENCOUNTER — Encounter (HOSPITAL_COMMUNITY): Payer: Self-pay | Admitting: Emergency Medicine

## 2018-02-10 ENCOUNTER — Other Ambulatory Visit: Payer: Self-pay

## 2018-02-10 DIAGNOSIS — G20 Parkinson's disease: Secondary | ICD-10-CM | POA: Diagnosis not present

## 2018-02-10 DIAGNOSIS — Z79899 Other long term (current) drug therapy: Secondary | ICD-10-CM | POA: Insufficient documentation

## 2018-02-10 DIAGNOSIS — M79605 Pain in left leg: Secondary | ICD-10-CM | POA: Diagnosis not present

## 2018-02-10 DIAGNOSIS — L03116 Cellulitis of left lower limb: Secondary | ICD-10-CM

## 2018-02-10 DIAGNOSIS — R29810 Facial weakness: Secondary | ICD-10-CM | POA: Diagnosis present

## 2018-02-10 LAB — COMPREHENSIVE METABOLIC PANEL
ALT: 6 U/L (ref 0–44)
AST: 20 U/L (ref 15–41)
Albumin: 3.5 g/dL (ref 3.5–5.0)
Alkaline Phosphatase: 47 U/L (ref 38–126)
Anion gap: 11 (ref 5–15)
BUN: 28 mg/dL — ABNORMAL HIGH (ref 8–23)
CO2: 21 mmol/L — ABNORMAL LOW (ref 22–32)
CREATININE: 1.04 mg/dL (ref 0.61–1.24)
Calcium: 8.8 mg/dL — ABNORMAL LOW (ref 8.9–10.3)
Chloride: 109 mmol/L (ref 98–111)
GFR calc Af Amer: >60 mL/min (ref >60)
GFR calc non Af Amer: >60 mL/min (ref >60)
Glucose, Bld: 87 mg/dL (ref 70–99)
Potassium: 3.6 mmol/L (ref 3.5–5.1)
Sodium: 141 mmol/L (ref 135–145)
Total Bilirubin: 0.9 mg/dL (ref 0.3–1.2)
Total Protein: 6.8 g/dL (ref 6.5–8.1)

## 2018-02-10 LAB — URINALYSIS, ROUTINE W REFLEX MICROSCOPIC
Bilirubin Urine: NEGATIVE
Glucose, UA: NEGATIVE mg/dL
Hgb urine dipstick: NEGATIVE
Ketones, ur: 5 mg/dL — AB
LEUKOCYTES UA: NEGATIVE
Nitrite: NEGATIVE
Protein, ur: NEGATIVE mg/dL
Specific Gravity, Urine: 1.021 (ref 1.005–1.030)
pH: 6 (ref 5.0–8.0)

## 2018-02-10 LAB — DIFFERENTIAL
Abs Immature Granulocytes: 0.03 10*3/uL (ref 0.00–0.07)
Basophils Absolute: 0 10*3/uL (ref 0.0–0.1)
Basophils Relative: 0 %
Eosinophils Absolute: 0.1 10*3/uL (ref 0.0–0.5)
Eosinophils Relative: 1 %
Immature Granulocytes: 0 %
Lymphocytes Relative: 26 %
Lymphs Abs: 2.2 10*3/uL (ref 0.7–4.0)
Monocytes Absolute: 0.8 10*3/uL (ref 0.1–1.0)
Monocytes Relative: 10 %
NEUTROS ABS: 5.3 10*3/uL (ref 1.7–7.7)
Neutrophils Relative %: 63 %

## 2018-02-10 LAB — CBC
HCT: 36.7 % — ABNORMAL LOW (ref 39.0–52.0)
HEMOGLOBIN: 11.8 g/dL — AB (ref 13.0–17.0)
MCH: 31.3 pg (ref 26.0–34.0)
MCHC: 32.2 g/dL (ref 30.0–36.0)
MCV: 97.3 fL (ref 80.0–100.0)
Platelets: 219 10*3/uL (ref 150–400)
RBC: 3.77 MIL/uL — ABNORMAL LOW (ref 4.22–5.81)
RDW: 12.1 % (ref 11.5–15.5)
WBC: 8.4 10*3/uL (ref 4.0–10.5)
nRBC: 0 % (ref 0.0–0.2)

## 2018-02-10 LAB — PROTIME-INR
INR: 1.13
Prothrombin Time: 14.4 seconds (ref 11.4–15.2)

## 2018-02-10 LAB — I-STAT CHEM 8, ED
BUN: 28 mg/dL — ABNORMAL HIGH (ref 8–23)
Calcium, Ion: 1.07 mmol/L — ABNORMAL LOW (ref 1.15–1.40)
Chloride: 112 mmol/L — ABNORMAL HIGH (ref 98–111)
Creatinine, Ser: 1.1 mg/dL (ref 0.61–1.24)
Glucose, Bld: 79 mg/dL (ref 70–99)
HCT: 35 % — ABNORMAL LOW (ref 39.0–52.0)
Hemoglobin: 11.9 g/dL — ABNORMAL LOW (ref 13.0–17.0)
Potassium: 3.5 mmol/L (ref 3.5–5.1)
Sodium: 143 mmol/L (ref 135–145)
TCO2: 23 mmol/L (ref 22–32)

## 2018-02-10 LAB — RAPID URINE DRUG SCREEN, HOSP PERFORMED
Amphetamines: NOT DETECTED
Barbiturates: NOT DETECTED
Benzodiazepines: NOT DETECTED
Cocaine: NOT DETECTED
Opiates: NOT DETECTED
Tetrahydrocannabinol: NOT DETECTED

## 2018-02-10 LAB — I-STAT TROPONIN, ED: Troponin i, poc: 0 ng/mL (ref 0.00–0.08)

## 2018-02-10 LAB — APTT: aPTT: 27 seconds (ref 24–36)

## 2018-02-10 LAB — I-STAT CG4 LACTIC ACID, ED: LACTIC ACID, VENOUS: 1.26 mmol/L (ref 0.5–1.9)

## 2018-02-10 LAB — BRAIN NATRIURETIC PEPTIDE: B Natriuretic Peptide: 62.1 pg/mL (ref 0.0–100.0)

## 2018-02-10 MED ORDER — CEPHALEXIN 500 MG PO CAPS: 500.0000 mg | ORAL_CAPSULE | Freq: Two times a day (BID) | ORAL | 0 refills | Status: AC

## 2018-02-10 NOTE — ED Provider Notes (Signed)
  Physical Exam  BP (!) 132/56   Pulse 70   Temp 99.2 F (37.3 C) (Oral)   Resp 16   Wt 74.8 kg   SpO2 99%   BMI 25.84 kg/m   Physical Exam  ED Course/Procedures   Clinical Course as of Feb 10 1901  Tue Feb 10, 2018  1217 Spoke to Dr. Otelia LimesLindzen, neurologist.  He feels that due to patient's subtle symptoms, timeline he is not a TPA candidate.  Does state that they will consult on the patient for stroke work-up, will need to obtain MRI of the brain.   [HK]    Clinical Course User Index [HK] Dietrich PatesKhatri, Hina, PA-C    Procedures  MDM  [facility] at shift change, please see her HPI for a full note.Briefly, patient with facial droop unknown time frame, previous provider spoke to Dr. Otelia LimesLindzen from neurology advised MRI Brain to r/o any acute infarct. MRI Brain resulted: 1. No acute intracranial process.  2. Stable moderate chronic small vessel ischemic changes. 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  3. Stable moderate parenchymal brain volume loss.   I have explained these results to patient who understands the plan. He has an appointment for neurology follow up on 12/6. Vitals are stable for discharge. Return precautions provided.    Claude MangesSoto, Daphne Karrer, PA-C 02/10/18 Prentice [id number]    Shaune PollackIsaacs, Cameron, MD 02/11/18 [phone]

## 2018-02-10 NOTE — ED Notes (Signed)
[place], [place] Notified about patient's BP being 92/46.

## 2018-02-10 NOTE — ED Triage Notes (Signed)
Pt in from home via [facility] with L side facial droop/numbness that began at 0600 this am per wife. EMS states this lasted 30 min and resolved PTA. Pt reports seeing "spots" in eyes at the time as well. Arrives a&ox4, has some tremors r/t Parkinsons

## 2018-02-10 NOTE — ED Notes (Signed)
Patient verbalizes understanding of discharge instructions. Opportunity for questioning and answers were provided. Armband removed by staff, pt discharged from ED.  

## 2018-02-10 NOTE — ED Provider Notes (Signed)
[facility] EMERGENCY DEPARTMENT Provider Note   CSN: [id number] Arrival date & time: 02/10/18  1058     History   Chief Complaint Chief Complaint  Patient presents with  . Facial Droop  . Foot Swelling    HPI Mark Mathews is a 77 y.o. male with a past medical history of Parkinson's disease, hyperlipidemia who presents to ED for multiple complaints. His first complaint is brief left-sided facial droop that occurred approximately 5 hours ago.  This was witnessed by his wife.  States that at that time he had a slight facial droop at the bottom of his face and drooling.  States that the drooling is not unusual for him.  States that it resolved after 30 minutes.  He also complained of "seeing spots" during the time.  No history of similar symptoms in the past.  Wife does endorse recurrent falls since June but denies any head injuries or loss of consciousness.  Also reports "hollering out, having hallucinations, all that Parkinson stuff," which has worsened since September.  He is set to meet with his neurologist in 6 days.  She reports compliance with his home medications. His next complaint is bilateral lower extremity edema, left greater than right.  States that this is been present since June as well.  Wife states that this is due to him "not walking, not sitting with his legs up."  States that this is not been evaluated by his provider in the past.  Denies any fevers, injury to the area, history of diabetes, history of DVT.  HPI  Past Medical History:  Diagnosis Date  . Other and unspecified hyperlipidemia   . Paralysis agitans (HCC)   . Parkinson's disease (HCC) 10/12/2012  . Spinal stenosis     Patient Active Problem List   Diagnosis Date Noted  . Closed compression fracture of L3 lumbar vertebra 04/09/2016  . Parkinson's disease (HCC) 10/12/2012    Past Surgical History:  Procedure Laterality Date  . APPENDECTOMY    . CATARACT EXTRACTION  2016   x3  .  INGUINAL HERNIA REPAIR Bilateral 03/06/2009  . KIDNEY SURGERY          Home Medications    Prior to Admission medications   Medication Sig Start Date End Date Taking? Authorizing Provider  amantadine (SYMMETREL) 100 MG capsule Take 1 capsule (100 mg total) by mouth 2 (two) times daily. 02/17/17   Huston Foley, MD  carbidopa-levodopa (SINEMET IR) 25-100 MG tablet Take 1.5 tablets by mouth 5 (five) times daily. In lieu of stalevo 150 mg 02/17/17   Huston Foley, MD  cephALEXin (KEFLEX) 500 MG capsule Take 1 capsule (500 mg total) by mouth 2 (two) times daily for 5 days. 02/10/18 02/15/18  Florena Kozma, PA-C  entacapone (COMTAN) 200 MG tablet Take 1 tablet (200 mg total) by mouth 5 (five) times daily. In lieu of Stalevo 150 mg 02/17/17   Huston Foley, MD  fludrocortisone (FLORINEF) 0.1 MG tablet Take 0.5 tablets (0.05 mg total) by mouth daily. 08/20/17   Huston Foley, MD  ibuprofen (ADVIL,MOTRIN) 200 MG tablet Take 200 mg by mouth every 6 (six) hours as needed for moderate pain.     [provider]  trihexyphenidyl (ARTANE) 2 MG tablet Take 0.5 tablets (1 mg total) by mouth 3 (three) times daily with meals. 11/24/17   Huston Foley, MD    Family History Family History  Problem Relation Age of Onset  . Heart failure Father  Social History Social History   Tobacco Use  . Smoking status: Never Smoker  . Smokeless tobacco: Never Used  Substance Use Topics  . Alcohol use: No    Alcohol/week: 0.0 standard drinks  . Drug use: No     Allergies   Patient has no known allergies.   Review of Systems Review of Systems  Constitutional: Negative for appetite change, chills and fever.  HENT: Negative for ear pain, rhinorrhea, sneezing and sore throat.   Eyes: Negative for photophobia and visual disturbance.  Respiratory: Negative for cough, chest tightness, shortness of breath and wheezing.   Cardiovascular: Positive for leg swelling. Negative for chest pain and palpitations.    Gastrointestinal: Negative for abdominal pain, blood in stool, constipation, diarrhea, nausea and vomiting.  Genitourinary: Negative for dysuria, hematuria and urgency.  Musculoskeletal: Negative for myalgias.  Skin: Positive for color change. Negative for rash.  Neurological: Positive for facial asymmetry. Negative for dizziness, weakness and light-headedness.  Psychiatric/Behavioral: Positive for agitation and hallucinations.     Physical Exam Updated Vital Signs BP (!) 98/51   Pulse 73   Temp 99.2 F (37.3 C) (Oral)   Resp 17   Wt 74.8 kg   SpO2 97%   BMI 25.84 kg/m   Physical Exam  Constitutional: He appears well-developed and well-nourished. No distress.  HENT:  Head: Normocephalic and atraumatic.  Nose: Nose normal.  Eyes: Conjunctivae and EOM are normal. Right eye exhibits no discharge. Left eye exhibits no discharge. No scleral icterus.  Neck: Normal range of motion. Neck supple.  Cardiovascular: Normal rate, regular rhythm, normal heart sounds and intact distal pulses. Exam reveals no gallop and no friction rub.  No murmur heard. Pulmonary/Chest: Effort normal and breath sounds normal. No respiratory distress.  Abdominal: Soft. Bowel sounds are normal. He exhibits no distension. There is no tenderness. There is no guarding.  Musculoskeletal: Normal range of motion. He exhibits edema and tenderness.  Bilateral lower extremity pitting edema, left greater than right.  Pedal edema noted bilaterally.  Erythema left greater than right. Wounds noted to LLE. Warmth of bilateral legs noted.  Neurological: He is alert. He exhibits normal muscle tone. Coordination normal.  Alert, oriented to self, family members, situation, year.  Very slight left-sided facial droop noted with smiling. PERRL.  Sensation intact to light touch on face, BUE and BLE. Strength 5/5 in BUE and BLE. Normal finger-to-nose coordination bilaterally.  Skin: Skin is warm and dry. No rash noted.  Psychiatric:  He has a normal mood and affect.  Nursing note and vitals reviewed.    ED Treatments / Results  Labs (all labs ordered are listed, but only abnormal results are displayed) Labs Reviewed  CBC - Abnormal; Notable for the following components:      Result Value   RBC 3.77 (*)    Hemoglobin 11.8 (*)    HCT 36.7 (*)    All other components within normal limits  COMPREHENSIVE METABOLIC PANEL - Abnormal; Notable for the following components:   CO2 21 (*)    BUN 28 (*)    Calcium 8.8 (*)    All other components within normal limits  URINALYSIS, ROUTINE W REFLEX MICROSCOPIC - Abnormal; Notable for the following components:   Color, Urine AMBER (*)    Ketones, ur 5 (*)    All other components within normal limits  I-STAT CHEM 8, ED - Abnormal; Notable for the following components:   Chloride 112 (*)    BUN 28 (*)  Calcium, Ion 1.07 (*)    Hemoglobin 11.9 (*)    HCT 35.0 (*)    All other components within normal limits  PROTIME-INR  APTT  DIFFERENTIAL  RAPID URINE DRUG SCREEN, HOSP PERFORMED  BRAIN NATRIURETIC PEPTIDE  ETHANOL  I-STAT TROPONIN, ED  I-STAT CG4 LACTIC ACID, ED    EKG EKG Interpretation  Date/Time:  Tuesday February 10 2018 11:07:33 EST Ventricular Rate:  75 PR Interval:    QRS Duration: 117 QT Interval:  428 QTC Calculation: 472 R Axis:   -11 Text Interpretation:  Sinus rhythm Atrial premature complex Consider right atrial enlargement Incomplete right bundle branch block Low voltage, precordial leads Artifact in lead(s) I II III aVR aVL aVF V1 V2 Interpretation limited secondary to artifact grossly similar to previous Confirmed by Frederick PeersLittle, Rachel [phone]([id number]) on 02/10/2018 11:19:41 AM   Radiology Ct Head Wo Contrast  Result Date: 02/10/2018 CLINICAL DATA:  77 year old male with transient left facial droop and numbness at 0600 hours today. EXAM: CT HEAD WITHOUT CONTRAST TECHNIQUE: Contiguous axial images were obtained from the base of the skull through the  vertex without intravenous contrast. COMPARISON:  Head CT 08/12/2017. Brain MRI 08/30/2016. FINDINGS: Brain: Stable cerebral volume. Stable patchy bilateral cerebral white matter hypodensity. No midline shift, ventriculomegaly, mass effect, evidence of mass lesion, intracranial hemorrhage or evidence of cortically based acute infarction. No cortical encephalomalacia identified. Vascular: Calcified atherosclerosis at the skull base. No suspicious intracranial vascular hyperdensity. Skull: No acute osseous abnormality identified. Sinuses/Orbits: Visualized paranasal sinuses and mastoids are stable and well pneumatized. Other: No acute orbit or scalp soft tissue finding. IMPRESSION: Stable non contrast CT appearance of the brain since June. Chronic cerebral volume loss and mild to moderate white matter changes. Electronically Signed   By: Odessa FlemingH  Hall M.D.   On: 02/10/2018 13:05    Procedures Procedures (including critical care time)  Medications Ordered in ED Medications - No data to display   Initial Impression / Assessment and Plan / ED Course  I have reviewed the triage vital signs and the nursing notes.  Pertinent labs & imaging results that were available during my care of the patient were reviewed by me and considered in my medical decision making (see chart for details).  Clinical Course as of Feb 10 1633  Tue Feb 10, 2018  1217 Spoke to Dr. Otelia LimesLindzen, neurologist.  He feels that due to patient's subtle symptoms, timeline he is not a TPA candidate.  Does state that they will consult on the patient for stroke work-up, will need to obtain MRI of the brain.   [HK]    Clinical Course User Index [HK] Dietrich PatesKhatri, Armella Stogner, PA-C    77yo male with a past medical history of Parkinson's disease, hyperlipidemia who presents to ED for multiple complaints. His first complaint is left-sided facial droop that occurred 5 hours ago.  This was witnessed by his wife but she states that it resolved.  He reported seeing  spots in his vision at that time.  He then denied the symptoms.  Unsure about exact timeline.  Wife reports "hollering out, having hallucinations" which she attributes to his worsening Parkinson's.  She reports compliance with his home medications.  He is scheduled to meet with his neurologist on 12/16.  On exam there is a very slight left-sided facial droop noted with smiling.  No other neuro deficits noted.  Plan is to obtain lab work, CT and reassess.  I consulted Dr. Otelia LimesLindzen who does not recommend that patient is a  TPA candidate. His next complaint is bilateral lower extremity edema, left greater than right.  This is been present since June and wife attributes this to patient's not sitting with his legs up and not walking.  No history of similar symptoms in the past.  On exam there is pitting edema in bilateral lower extremities, worse on the left with pedal edema.  Mild erythema and wounds noted.  2+ DP pulses palpated bilaterally.  He is afebrile. He has passed his stroke swallow screen and has taken his home medications.  Lab work significant for normal creatinine, slight increase in BUN to 28.  Lactic acid normal.  No leukocytosis.  Urinalysis negative.  Troponin negative.  BNP normal.  CBC unremarkable.  CT of the head shows chronic findings consistent with Parkinson's.  Plan is to obtain MRI, reassess. Will give short course of Keflex for possible mild infection.  If MRI is negative, Dr. Otelia Limes does not feel that patient needs to be admitted for stroke work-up.  He may need to be referred back to his neurologist for titration of his Parkinson's medications. Patient discussed with and seen by my attending, Dr. Clarene Duke.  Care handed off to PA [facility] pending reassessment and imaging.    Portions of this note were generated with Scientist, clinical (histocompatibility and immunogenetics). Dictation errors may occur despite best attempts at proofreading.   Final Clinical Impressions(s) / ED Diagnoses   Final diagnoses:    Cellulitis of left lower extremity    ED Discharge Orders         Ordered    cephALEXin (KEFLEX) 500 MG capsule  2 times daily     02/10/18 1622           Dietrich Pates, PA-C 02/10/18 1634    Little, Ambrose Finland, MD 02/15/18 1321

## 2018-02-10 NOTE — ED Notes (Signed)
Patient transported to MRI 

## 2018-02-10 NOTE — Discharge Instructions (Signed)
Take the antibiotics to help with the possible infection of your leg.  However, this appears to be more related to peripheral edema.  Please sit with your legs elevated and walk with your walker. Take antibiotics for the entire course regardless of symptom improvement. You will need to follow-up with your neurologist for any changes to your Parkinson's medications.

## 2018-02-10 NOTE — ED Notes (Signed)
Spoke to MRI, patient is next in line for MR Brain. Patient and family updated.

## 2018-02-16 ENCOUNTER — Encounter: Payer: Self-pay | Admitting: Neurology

## 2018-02-16 ENCOUNTER — Ambulatory Visit (INDEPENDENT_AMBULATORY_CARE_PROVIDER_SITE_OTHER): Payer: Medicare Other | Admitting: Neurology

## 2018-02-16 VITALS — BP 111/62 | HR 72 | Ht 67.0 in | Wt 158.0 lb

## 2018-02-16 DIAGNOSIS — G20 Parkinson's disease: Secondary | ICD-10-CM | POA: Diagnosis not present

## 2018-02-16 DIAGNOSIS — F060 Psychotic disorder with hallucinations due to known physiological condition: Secondary | ICD-10-CM

## 2018-02-16 DIAGNOSIS — R454 Irritability and anger: Secondary | ICD-10-CM | POA: Diagnosis not present

## 2018-02-16 DIAGNOSIS — F418 Other specified anxiety disorders: Secondary | ICD-10-CM | POA: Diagnosis not present

## 2018-02-16 DIAGNOSIS — F062 Psychotic disorder with delusions due to known physiological condition: Secondary | ICD-10-CM

## 2018-02-16 MED ORDER — CITALOPRAM HYDROBROMIDE 10 MG PO TABS: 10.0000 mg | ORAL_TABLET | Freq: Every day | ORAL | 5 refills | Status: DC

## 2018-02-16 NOTE — Patient Instructions (Addendum)
You have had more challenges with your mood, including anxiety and violent outbursts.  If there are concerns for safety, please call 911! You have had more challenges with falls, motor and cognitive decline.  I would like to start Celexa generic, and make a referral to a geriatric psychiatrist.   You can try Melatonin at night for sleep: take 1 mg to 3 mg, one to 2 hours before your bedtime. You can go up to 5 mg if needed. It is over the counter and comes in pill form, chewable form and spray, if you prefer.   For your Parkinson's related hallucinations and delusions we may try you on a medication, called Nuplazid, which is specifically approved by the FDA for this indication.
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
## 2018-03-09 ENCOUNTER — Other Ambulatory Visit: Payer: Self-pay | Admitting: Neurology

## 2018-03-09 DIAGNOSIS — G20 Parkinson's disease: Secondary | ICD-10-CM

## 2018-03-24 ENCOUNTER — Telehealth: Payer: Self-pay | Admitting: Neurology

## 2018-03-24 NOTE — Telephone Encounter (Signed)
Pt wife(on [facility],ELIZABETH) has called to inform that she was told to call after 30 days if the anti depressant medications had not helped, the medications have not helped and wife is asking for a call to discuss

## 2018-03-24 NOTE — Telephone Encounter (Signed)
Please advise pt or his wife, that antidepressants can take several weeks to kick in. As long as he tolerates it, I suggest, he continue. I also made a referral to geriatric psychiatry in December. Please inquire as to the appointment status, have they been contacted yet?

## 2018-03-24 NOTE — Telephone Encounter (Signed)
Thank you, nothing further needed. We talked at the visit extensively about safety at home, psychosis, depression and how a specialist in the area of psychiatry may help.
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
## 2018-04-01 ENCOUNTER — Other Ambulatory Visit: Payer: Self-pay

## 2018-04-01 ENCOUNTER — Inpatient Hospital Stay (HOSPITAL_COMMUNITY)
Admission: EM | Admit: 2018-04-01 | Discharge: 2018-04-09 | DRG: 853 | Disposition: A | Discharge status: Skilled Nursing Facility | Payer: Medicare Other | Attending: Internal Medicine | Admitting: Internal Medicine

## 2018-04-01 DIAGNOSIS — D539 Nutritional anemia, unspecified: Secondary | ICD-10-CM | POA: Diagnosis present

## 2018-04-01 DIAGNOSIS — G20 Parkinson's disease: Secondary | ICD-10-CM | POA: Diagnosis present

## 2018-04-01 DIAGNOSIS — L89154 Pressure ulcer of sacral region, stage 4: Secondary | ICD-10-CM | POA: Diagnosis present

## 2018-04-01 DIAGNOSIS — R159 Full incontinence of feces: Secondary | ICD-10-CM | POA: Diagnosis present

## 2018-04-01 DIAGNOSIS — R10819 Abdominal tenderness, unspecified site: Secondary | ICD-10-CM

## 2018-04-01 DIAGNOSIS — T148XXA Other injury of unspecified body region, initial encounter: Secondary | ICD-10-CM

## 2018-04-01 DIAGNOSIS — L8990 Pressure ulcer of unspecified site, unspecified stage: Secondary | ICD-10-CM

## 2018-04-01 DIAGNOSIS — B966 Bacteroides fragilis [B. fragilis] as the cause of diseases classified elsewhere: Secondary | ICD-10-CM | POA: Diagnosis present

## 2018-04-01 DIAGNOSIS — E876 Hypokalemia: Secondary | ICD-10-CM | POA: Diagnosis present

## 2018-04-01 DIAGNOSIS — R627 Adult failure to thrive: Secondary | ICD-10-CM | POA: Diagnosis present

## 2018-04-01 DIAGNOSIS — F0391 Unspecified dementia with behavioral disturbance: Secondary | ICD-10-CM | POA: Diagnosis present

## 2018-04-01 DIAGNOSIS — F03918 Unspecified dementia, unspecified severity, with other behavioral disturbance: Secondary | ICD-10-CM

## 2018-04-01 DIAGNOSIS — M4800 Spinal stenosis, site unspecified: Secondary | ICD-10-CM | POA: Diagnosis present

## 2018-04-01 DIAGNOSIS — I96 Gangrene, not elsewhere classified: Secondary | ICD-10-CM | POA: Diagnosis present

## 2018-04-01 DIAGNOSIS — Z7401 Bed confinement status: Secondary | ICD-10-CM

## 2018-04-01 DIAGNOSIS — Z791 Long term (current) use of non-steroidal anti-inflammatories (NSAID): Secondary | ICD-10-CM

## 2018-04-01 DIAGNOSIS — E440 Moderate protein-calorie malnutrition: Secondary | ICD-10-CM | POA: Diagnosis present

## 2018-04-01 DIAGNOSIS — F329 Major depressive disorder, single episode, unspecified: Secondary | ICD-10-CM | POA: Diagnosis present

## 2018-04-01 DIAGNOSIS — E785 Hyperlipidemia, unspecified: Secondary | ICD-10-CM | POA: Diagnosis present

## 2018-04-01 DIAGNOSIS — Z66 Do not resuscitate: Secondary | ICD-10-CM | POA: Diagnosis present

## 2018-04-01 DIAGNOSIS — B9689 Other specified bacterial agents as the cause of diseases classified elsewhere: Secondary | ICD-10-CM | POA: Diagnosis present

## 2018-04-01 DIAGNOSIS — F419 Anxiety disorder, unspecified: Secondary | ICD-10-CM | POA: Diagnosis present

## 2018-04-01 DIAGNOSIS — Z7189 Other specified counseling: Secondary | ICD-10-CM

## 2018-04-01 DIAGNOSIS — D649 Anemia, unspecified: Secondary | ICD-10-CM

## 2018-04-01 DIAGNOSIS — R6520 Severe sepsis without septic shock: Secondary | ICD-10-CM | POA: Diagnosis present

## 2018-04-01 DIAGNOSIS — Z634 Disappearance and death of family member: Secondary | ICD-10-CM

## 2018-04-01 DIAGNOSIS — R609 Edema, unspecified: Secondary | ICD-10-CM | POA: Diagnosis present

## 2018-04-01 DIAGNOSIS — Z79899 Other long term (current) drug therapy: Secondary | ICD-10-CM

## 2018-04-01 DIAGNOSIS — A414 Sepsis due to anaerobes: Secondary | ICD-10-CM | POA: Diagnosis present

## 2018-04-01 DIAGNOSIS — Z6825 Body mass index (BMI) 25.0-25.9, adult: Secondary | ICD-10-CM

## 2018-04-01 DIAGNOSIS — S31000A Unspecified open wound of lower back and pelvis without penetration into retroperitoneum, initial encounter: Secondary | ICD-10-CM

## 2018-04-01 DIAGNOSIS — L089 Local infection of the skin and subcutaneous tissue, unspecified: Secondary | ICD-10-CM

## 2018-04-01 DIAGNOSIS — Z515 Encounter for palliative care: Secondary | ICD-10-CM | POA: Diagnosis present

## 2018-04-01 DIAGNOSIS — Z8249 Family history of ischemic heart disease and other diseases of the circulatory system: Secondary | ICD-10-CM

## 2018-04-01 DIAGNOSIS — A419 Sepsis, unspecified organism: Secondary | ICD-10-CM | POA: Diagnosis not present

## 2018-04-01 DIAGNOSIS — M419 Scoliosis, unspecified: Secondary | ICD-10-CM | POA: Diagnosis present

## 2018-04-01 DIAGNOSIS — M4856XA Collapsed vertebra, not elsewhere classified, lumbar region, initial encounter for fracture: Secondary | ICD-10-CM | POA: Diagnosis present

## 2018-04-01 LAB — CBC WITH DIFFERENTIAL/PLATELET
Abs Immature Granulocytes: 0.33 10*3/uL — ABNORMAL HIGH (ref 0.00–0.07)
Basophils Absolute: 0 10*3/uL (ref 0.0–0.1)
Basophils Relative: 0 %
Eosinophils Absolute: 0.1 10*3/uL (ref 0.0–0.5)
Eosinophils Relative: 1 %
HCT: 35.9 % — ABNORMAL LOW (ref 39.0–52.0)
Hemoglobin: 11.3 g/dL — ABNORMAL LOW (ref 13.0–17.0)
IMMATURE GRANULOCYTES: 2 %
Lymphocytes Relative: 6 %
Lymphs Abs: 1.3 10*3/uL (ref 0.7–4.0)
MCH: 31.4 pg (ref 26.0–34.0)
MCHC: 31.5 g/dL (ref 30.0–36.0)
MCV: 99.7 fL (ref 80.0–100.0)
Monocytes Absolute: 0.9 10*3/uL (ref 0.1–1.0)
Monocytes Relative: 4 %
Neutro Abs: 18.3 10*3/uL — ABNORMAL HIGH (ref 1.7–7.7)
Neutrophils Relative %: 87 %
Platelets: 315 10*3/uL (ref 150–400)
RBC: 3.6 MIL/uL — AB (ref 4.22–5.81)
RDW: 12.4 % (ref 11.5–15.5)
WBC: 21 10*3/uL — ABNORMAL HIGH (ref 4.0–10.5)
nRBC: 0 % (ref 0.0–0.2)

## 2018-04-01 LAB — COMPREHENSIVE METABOLIC PANEL
ALT: 9 U/L (ref 0–44)
AST: 44 U/L — ABNORMAL HIGH (ref 15–41)
Albumin: 2.4 g/dL — ABNORMAL LOW (ref 3.5–5.0)
Alkaline Phosphatase: 53 U/L (ref 38–126)
Anion gap: 15 (ref 5–15)
BUN: 23 mg/dL (ref 8–23)
CHLORIDE: 101 mmol/L (ref 98–111)
CO2: 23 mmol/L (ref 22–32)
Calcium: 8.5 mg/dL — ABNORMAL LOW (ref 8.9–10.3)
Creatinine, Ser: 0.93 mg/dL (ref 0.61–1.24)
GFR calc Af Amer: >60 mL/min (ref >60)
GFR calc non Af Amer: >60 mL/min (ref >60)
Glucose, Bld: 113 mg/dL — ABNORMAL HIGH (ref 70–99)
Potassium: 3.5 mmol/L (ref 3.5–5.1)
Sodium: 139 mmol/L (ref 135–145)
Total Bilirubin: 0.8 mg/dL (ref 0.3–1.2)
Total Protein: 6.6 g/dL (ref 6.5–8.1)

## 2018-04-01 LAB — LACTIC ACID, PLASMA: Lactic Acid, Venous: 2.2 mmol/L (ref 0.5–1.9)

## 2018-04-01 MED ORDER — SODIUM CHLORIDE 0.9 % IV BOLUS
1000.0000 mL | Freq: Once | INTRAVENOUS | Status: AC
  Administered 2018-04-01: 1000 mL via INTRAVENOUS

## 2018-04-01 MED ORDER — VANCOMYCIN HCL 10 G IV SOLR
1500.0000 mg | Freq: Once | INTRAVENOUS | Status: AC
  Administered 2018-04-01: 1500 mg via INTRAVENOUS
  Filled 2018-04-01: qty 1500

## 2018-04-01 MED ORDER — PIPERACILLIN-TAZOBACTAM 3.375 G IVPB 30 MIN
3.3750 g | Freq: Once | INTRAVENOUS | Status: AC
  Administered 2018-04-01: 3.375 g via INTRAVENOUS
  Filled 2018-04-01: qty 50

## 2018-04-01 NOTE — ED Provider Notes (Signed)
[facility] EMERGENCY DEPARTMENT Provider Note   CSN: [id number] Arrival date & time: 04/01/18  2054     History   Chief Complaint Chief Complaint  Patient presents with  . Wound Check    HPI Mark Mathews is a 78 y.o. male with a past medical history significant for Parkinson disease, depression, anxiety who presents today for AMS, weakness. Patient is brought in by wife and son concerned about a sacral that have significantly progress in the past week. They report that wound was the size of a quarter and they have been trying to keep dry and clean but patient has been weaker and less cooperative. They have not been able to clean patient as he has been unable to stand on his own feet. Given worsening mental status and wound they decided to bring him to the ED for further evaluation.They denied any fevers at home but endorse tremors vs chills. He has had urinary and bowel incontinence as well. They use to have PT come to their house but due to insurance have not have any therapy since. They denies any vomiting. Patient has refused to take any of his medications. Patient is non verbal and history was obtained from family members.   HPI  Past Medical History:  Diagnosis Date  . Other and unspecified hyperlipidemia   . Paralysis agitans (HCC)   . Parkinson's disease (HCC) 10/12/2012  . Spinal stenosis     Patient Active Problem List   Diagnosis Date Noted  . Closed compression fracture of L3 lumbar vertebra 04/09/2016  . Parkinson's disease (HCC) 10/12/2012    Past Surgical History:  Procedure Laterality Date  . APPENDECTOMY    . CATARACT EXTRACTION  2016   x3  . INGUINAL HERNIA REPAIR Bilateral 03/06/2009  . KIDNEY SURGERY          Home Medications    Prior to Admission medications   Medication Sig Start Date End Date Taking? Authorizing Provider  carbidopa-levodopa (SINEMET IR) 25-100 MG tablet TAKE ONE AND ONE-HALF TABLETS FIVE TIMES DAILY 03/10/18    Huston Foley, MD  citalopram (CELEXA) 10 MG tablet Take 1 tablet (10 mg total) by mouth daily. 02/16/18   Huston Foley, MD  entacapone (COMTAN) 200 MG tablet Take 1 tablet (200 mg total) by mouth 5 (five) times daily. In lieu of Stalevo 150 mg 02/17/17   Huston Foley, MD  ibuprofen (ADVIL,MOTRIN) 200 MG tablet Take 200 mg by mouth every 6 (six) hours as needed for moderate pain.     [provider]  trihexyphenidyl (ARTANE) 2 MG tablet Take 0.5 tablets (1 mg total) by mouth 3 (three) times daily with meals. 11/24/17   Huston Foley, MD    Family History Family History  Problem Relation Age of Onset  . Heart failure Father     Social History Social History   Tobacco Use  . Smoking status: Never Smoker  . Smokeless tobacco: Never Used  Substance Use Topics  . Alcohol use: No    Alcohol/week: 0.0 standard drinks  . Drug use: No     Allergies   Patient has no known allergies.   Review of Systems Review of Systems  Constitutional: Negative.   HENT: Negative.   Respiratory: Negative.   Cardiovascular: Negative.   Gastrointestinal: Negative.   Genitourinary: Negative.   Musculoskeletal: Negative.   Neurological: Positive for tremors, speech difficulty and weakness.  Psychiatric/Behavioral: Positive for confusion.    Physical Exam Updated Vital Signs BP 101/74  Pulse 89   Temp 99.7 F (37.6 C) (Rectal)   Resp 15   Ht 5\' 7"  (1.702 m)   Wt 70.3 kg   SpO2 100%   BMI 24.28 kg/m   Physical Exam Constitutional:      Appearance: He is normal weight.  HENT:     Head: Normocephalic and atraumatic.     Nose: Nose normal.     Mouth/Throat:     Mouth: Mucous membranes are moist.     Pharynx: Oropharynx is clear.  Eyes:     Extraocular Movements: Extraocular movements intact.     Conjunctiva/sclera: Conjunctivae normal.     Pupils: Pupils are equal, round, and reactive to light.  Neck:     Musculoskeletal: Normal range of motion and neck supple.    Cardiovascular:     Rate and Rhythm: Normal rate and regular rhythm.     Pulses: Normal pulses.     Heart sounds: Normal heart sounds.  Pulmonary:     Effort: Pulmonary effort is normal.     Breath sounds: Normal breath sounds.  Abdominal:     General: Abdomen is flat. Bowel sounds are normal.  Musculoskeletal:     Comments: Limited range of motion, rigidity from parkinson's   Skin:    Capillary Refill: Capillary refill takes less than 2 seconds.     Comments: Large sacral wound with central necrotic area and surrounding erythematous/granulation tissue.  Neurological:     Mental Status: He is alert. He is disoriented.     Comments: Non verbal    ED Treatments / Results  Labs (all labs ordered are listed, but only abnormal results are displayed) Labs Reviewed  LACTIC ACID, PLASMA - Abnormal; Notable for the following components:      Result Value   Lactic Acid, Venous 2.2 (*)    All other components within normal limits  COMPREHENSIVE METABOLIC PANEL - Abnormal; Notable for the following components:   Glucose, Bld 113 (*)    Calcium 8.5 (*)    Albumin 2.4 (*)    AST 44 (*)    All other components within normal limits  CBC WITH DIFFERENTIAL/PLATELET - Abnormal; Notable for the following components:   WBC 21.0 (*)    RBC 3.60 (*)    Hemoglobin 11.3 (*)    HCT 35.9 (*)    Neutro Abs 18.3 (*)    Abs Immature Granulocytes 0.33 (*)    All other components within normal limits  CULTURE, BLOOD (ROUTINE X 2)  CULTURE, BLOOD (ROUTINE X 2)  LACTIC ACID, PLASMA  URINALYSIS, ROUTINE W REFLEX MICROSCOPIC    EKG EKG Interpretation  Date/Time:  Wednesday April 01 2018 21:18:16 EST Ventricular Rate:  87 PR Interval:    QRS Duration: 139 QT Interval:  386 QTC Calculation: 465 R Axis:   -18 Text Interpretation:  Sinus tachycardia Multiform ventricular premature complexes Borderline short PR interval Right bundle branch block Artifact in lead(s) I II aVR aVF V1 V3 V4 V5 V6  impossible to interpret Confirmed by Marily Memos [phone]) on 04/01/2018 10:45:49 PM   Radiology No results found.  Procedures Procedures (including critical care time)  Medications Ordered in ED Medications  vancomycin (VANCOCIN) 1,500 mg in sodium chloride 0.9 % 500 mL IVPB (1,500 mg Intravenous New Bag/Given 04/01/18 2232)  piperacillin-tazobactam (ZOSYN) IVPB 3.375 g (3.375 g Intravenous New Bag/Given 04/01/18 2233)  sodium chloride 0.9 % bolus 1,000 mL (1,000 mLs Intravenous New Bag/Given 04/01/18 2231)     Initial Impression /  Assessment and Plan / ED Course  I have reviewed the triage vital signs and the nursing notes.  Pertinent labs & imaging results that were available during my care of the patient were reviewed by me and considered in my medical decision making (see chart for details).   Patient is a 78 yo male who presents today for worsening mental status, and weakness concerning for sepsis in the setting of worsening sacral wound. Patient has been less interactive and weaker for the past week and wound has gradually worsened. On exam, wound is large with a central area of necrosis, malodorous and non draining with surrounding erythematous tissue. Patient was afebrile and mildly hypotensive with leukocytosis WBC 21.0 with left shift concerning for bacteremia. Patient LA was 2.2. Blood cultures were collected and he was started on broad spectrum antibiotic. Patient will need wound care and likely surgical consult for possible debridement.   Final Clinical Impressions(s) / ED Diagnoses   Final diagnoses:  Wound infection    ED Discharge Orders    None       Lovena Neighboursiallo, Laquana Villari, MD 04/01/18 2351    Marily MemosMesner, Jason, MD 04/02/18 2021

## 2018-04-01 NOTE — ED Triage Notes (Signed)
EMS called for possible infection. Patient is alert, but had advanced dementia/parkinson's. Large wound to lower back and sacral area as well as excoriation on testicles.

## 2018-04-02 ENCOUNTER — Inpatient Hospital Stay (HOSPITAL_COMMUNITY): Payer: Medicare Other | Admitting: Certified Registered"

## 2018-04-02 ENCOUNTER — Encounter (HOSPITAL_COMMUNITY)
Admission: EM | Disposition: A | Discharge status: Skilled Nursing Facility | Payer: Self-pay | Source: Home / Self Care | Attending: Internal Medicine

## 2018-04-02 ENCOUNTER — Encounter (HOSPITAL_COMMUNITY): Payer: Self-pay | Admitting: Certified Registered"

## 2018-04-02 ENCOUNTER — Inpatient Hospital Stay (HOSPITAL_COMMUNITY): Payer: Medicare Other

## 2018-04-02 DIAGNOSIS — R6520 Severe sepsis without septic shock: Secondary | ICD-10-CM

## 2018-04-02 DIAGNOSIS — A419 Sepsis, unspecified organism: Secondary | ICD-10-CM

## 2018-04-02 DIAGNOSIS — D72829 Elevated white blood cell count, unspecified: Secondary | ICD-10-CM | POA: Diagnosis not present

## 2018-04-02 DIAGNOSIS — D649 Anemia, unspecified: Secondary | ICD-10-CM

## 2018-04-02 DIAGNOSIS — F329 Major depressive disorder, single episode, unspecified: Secondary | ICD-10-CM | POA: Diagnosis present

## 2018-04-02 DIAGNOSIS — R7881 Bacteremia: Secondary | ICD-10-CM | POA: Diagnosis not present

## 2018-04-02 DIAGNOSIS — E440 Moderate protein-calorie malnutrition: Secondary | ICD-10-CM | POA: Diagnosis present

## 2018-04-02 DIAGNOSIS — M4800 Spinal stenosis, site unspecified: Secondary | ICD-10-CM | POA: Diagnosis present

## 2018-04-02 DIAGNOSIS — M4856XA Collapsed vertebra, not elsewhere classified, lumbar region, initial encounter for fracture: Secondary | ICD-10-CM | POA: Diagnosis present

## 2018-04-02 DIAGNOSIS — S31000A Unspecified open wound of lower back and pelvis without penetration into retroperitoneum, initial encounter: Secondary | ICD-10-CM

## 2018-04-02 DIAGNOSIS — G20 Parkinson's disease: Secondary | ICD-10-CM | POA: Diagnosis present

## 2018-04-02 DIAGNOSIS — R10819 Abdominal tenderness, unspecified site: Secondary | ICD-10-CM

## 2018-04-02 DIAGNOSIS — F0391 Unspecified dementia with behavioral disturbance: Secondary | ICD-10-CM | POA: Diagnosis not present

## 2018-04-02 DIAGNOSIS — Z7401 Bed confinement status: Secondary | ICD-10-CM | POA: Diagnosis not present

## 2018-04-02 DIAGNOSIS — Z66 Do not resuscitate: Secondary | ICD-10-CM | POA: Diagnosis present

## 2018-04-02 DIAGNOSIS — R159 Full incontinence of feces: Secondary | ICD-10-CM | POA: Diagnosis present

## 2018-04-02 DIAGNOSIS — T148XXA Other injury of unspecified body region, initial encounter: Secondary | ICD-10-CM | POA: Diagnosis present

## 2018-04-02 DIAGNOSIS — Z7189 Other specified counseling: Secondary | ICD-10-CM | POA: Diagnosis not present

## 2018-04-02 DIAGNOSIS — Z515 Encounter for palliative care: Secondary | ICD-10-CM

## 2018-04-02 DIAGNOSIS — L89154 Pressure ulcer of sacral region, stage 4: Secondary | ICD-10-CM | POA: Diagnosis present

## 2018-04-02 DIAGNOSIS — D539 Nutritional anemia, unspecified: Secondary | ICD-10-CM | POA: Diagnosis present

## 2018-04-02 DIAGNOSIS — S31000D Unspecified open wound of lower back and pelvis without penetration into retroperitoneum, subsequent encounter: Secondary | ICD-10-CM | POA: Diagnosis not present

## 2018-04-02 DIAGNOSIS — I96 Gangrene, not elsewhere classified: Secondary | ICD-10-CM | POA: Diagnosis present

## 2018-04-02 DIAGNOSIS — E785 Hyperlipidemia, unspecified: Secondary | ICD-10-CM | POA: Diagnosis present

## 2018-04-02 DIAGNOSIS — R627 Adult failure to thrive: Secondary | ICD-10-CM | POA: Diagnosis present

## 2018-04-02 DIAGNOSIS — M419 Scoliosis, unspecified: Secondary | ICD-10-CM | POA: Diagnosis present

## 2018-04-02 DIAGNOSIS — B966 Bacteroides fragilis [B. fragilis] as the cause of diseases classified elsewhere: Secondary | ICD-10-CM | POA: Diagnosis present

## 2018-04-02 DIAGNOSIS — R609 Edema, unspecified: Secondary | ICD-10-CM | POA: Diagnosis present

## 2018-04-02 DIAGNOSIS — F419 Anxiety disorder, unspecified: Secondary | ICD-10-CM | POA: Diagnosis present

## 2018-04-02 DIAGNOSIS — E876 Hypokalemia: Secondary | ICD-10-CM | POA: Diagnosis present

## 2018-04-02 DIAGNOSIS — B9689 Other specified bacterial agents as the cause of diseases classified elsewhere: Secondary | ICD-10-CM | POA: Diagnosis present

## 2018-04-02 DIAGNOSIS — A414 Sepsis due to anaerobes: Secondary | ICD-10-CM | POA: Diagnosis present

## 2018-04-02 HISTORY — PX: INCISION AND DRAINAGE ABSCESS: SHX5864

## 2018-04-02 LAB — CBC
HCT: 29.3 % — ABNORMAL LOW (ref 39.0–52.0)
Hemoglobin: 9.7 g/dL — ABNORMAL LOW (ref 13.0–17.0)
MCH: 31.5 pg (ref 26.0–34.0)
MCHC: 33.1 g/dL (ref 30.0–36.0)
MCV: 95.1 fL (ref 80.0–100.0)
PLATELETS: 283 10*3/uL (ref 150–400)
RBC: 3.08 MIL/uL — AB (ref 4.22–5.81)
RDW: 12.4 % (ref 11.5–15.5)
WBC: 17.8 10*3/uL — ABNORMAL HIGH (ref 4.0–10.5)
nRBC: 0 % (ref 0.0–0.2)

## 2018-04-02 LAB — URINALYSIS, ROUTINE W REFLEX MICROSCOPIC
Bacteria, UA: NONE SEEN
Bilirubin Urine: NEGATIVE
GLUCOSE, UA: NEGATIVE mg/dL
Ketones, ur: 5 mg/dL — AB
Leukocytes, UA: NEGATIVE
Nitrite: NEGATIVE
PH: 6 (ref 5.0–8.0)
Protein, ur: 30 mg/dL — AB
Specific Gravity, Urine: >1.046 — ABNORMAL HIGH (ref 1.005–1.030)

## 2018-04-02 LAB — MRSA PCR SCREENING: MRSA by PCR: NEGATIVE

## 2018-04-02 LAB — LACTIC ACID, PLASMA
LACTIC ACID, VENOUS: 1.3 mmol/L (ref 0.5–1.9)
Lactic Acid, Venous: 2.3 mmol/L (ref 0.5–1.9)

## 2018-04-02 SURGERY — INCISION AND DRAINAGE, ABSCESS
Anesthesia: General | Site: Back

## 2018-04-02 MED ORDER — VANCOMYCIN HCL 10 G IV SOLR
1500.0000 mg | INTRAVENOUS | Status: DC
  Administered 2018-04-02 – 2018-04-06 (×5): 1500 mg via INTRAVENOUS
  Filled 2018-04-02 (×5): qty 1500

## 2018-04-02 MED ORDER — TRIHEXYPHENIDYL HCL 2 MG PO TABS
1.0000 mg | ORAL_TABLET | Freq: Three times a day (TID) | ORAL | Status: DC
  Administered 2018-04-02 – 2018-04-09 (×19): 1 mg via ORAL
  Filled 2018-04-02 (×23): qty 1

## 2018-04-02 MED ORDER — ROCURONIUM BROMIDE 50 MG/5ML IV SOSY
PREFILLED_SYRINGE | INTRAVENOUS | Status: AC
  Filled 2018-04-02: qty 5

## 2018-04-02 MED ORDER — PROPOFOL 10 MG/ML IV BOLUS
INTRAVENOUS | Status: AC
  Filled 2018-04-02: qty 20

## 2018-04-02 MED ORDER — ENOXAPARIN SODIUM 40 MG/0.4ML ~~LOC~~ SOLN: 40.0000 mg | SUBCUTANEOUS | Status: DC

## 2018-04-02 MED ORDER — ONDANSETRON HCL 4 MG/2ML IJ SOLN
INTRAMUSCULAR | Status: DC | PRN
  Administered 2018-04-02: 4 mg via INTRAVENOUS

## 2018-04-02 MED ORDER — LIDOCAINE 2% (20 MG/ML) 5 ML SYRINGE
INTRAMUSCULAR | Status: DC | PRN
  Administered 2018-04-02: 20 mg via INTRAVENOUS

## 2018-04-02 MED ORDER — LIDOCAINE 2% (20 MG/ML) 5 ML SYRINGE
INTRAMUSCULAR | Status: AC
  Filled 2018-04-02: qty 5

## 2018-04-02 MED ORDER — PROPOFOL 10 MG/ML IV BOLUS
INTRAVENOUS | Status: DC | PRN
  Administered 2018-04-02: 40 mg via INTRAVENOUS

## 2018-04-02 MED ORDER — FENTANYL CITRATE (PF) 100 MCG/2ML IJ SOLN
INTRAMUSCULAR | Status: DC | PRN
  Administered 2018-04-02: 25 ug via INTRAVENOUS

## 2018-04-02 MED ORDER — ALBUMIN HUMAN 5 % IV SOLN
INTRAVENOUS | Status: DC | PRN
  Administered 2018-04-02: 06:00:00 via INTRAVENOUS

## 2018-04-02 MED ORDER — CARBIDOPA-LEVODOPA 25-100 MG PO TABS
1.5000 | ORAL_TABLET | Freq: Every day | ORAL | Status: DC
  Administered 2018-04-02 – 2018-04-09 (×33): 1.5 via ORAL
  Filled 2018-04-02 (×27): qty 1.5
  Filled 2018-04-02: qty 2
  Filled 2018-04-02 (×7): qty 1.5

## 2018-04-02 MED ORDER — SUGAMMADEX SODIUM 500 MG/5ML IV SOLN
INTRAVENOUS | Status: AC
  Filled 2018-04-02: qty 5

## 2018-04-02 MED ORDER — FENTANYL CITRATE (PF) 250 MCG/5ML IJ SOLN
INTRAMUSCULAR | Status: AC
  Filled 2018-04-02: qty 5

## 2018-04-02 MED ORDER — ACETAMINOPHEN 500 MG PO TABS
1000.0000 mg | ORAL_TABLET | Freq: Three times a day (TID) | ORAL | Status: DC
  Administered 2018-04-02 – 2018-04-09 (×18): 1000 mg via ORAL
  Filled 2018-04-02 (×20): qty 2

## 2018-04-02 MED ORDER — ENTACAPONE 200 MG PO TABS
200.0000 mg | ORAL_TABLET | Freq: Every day | ORAL | Status: DC
  Administered 2018-04-02 – 2018-04-09 (×34): 200 mg via ORAL
  Filled 2018-04-02 (×3): qty 1
  Filled 2018-04-02: qty 3
  Filled 2018-04-02 (×33): qty 1

## 2018-04-02 MED ORDER — ALBUMIN HUMAN 5 % IV SOLN
12.5000 g | Freq: Once | INTRAVENOUS | Status: AC
  Administered 2018-04-02: 12.5 g via INTRAVENOUS

## 2018-04-02 MED ORDER — 0.9 % SODIUM CHLORIDE (POUR BTL) OPTIME
TOPICAL | Status: DC | PRN
  Administered 2018-04-02: 1000 mL

## 2018-04-02 MED ORDER — ACETAMINOPHEN 650 MG RE SUPP
650.0000 mg | Freq: Four times a day (QID) | RECTAL | Status: DC | PRN
  Administered 2018-04-02: 650 mg via RECTAL
  Filled 2018-04-02: qty 1

## 2018-04-02 MED ORDER — SODIUM CHLORIDE 0.9 % IV SOLN
INTRAVENOUS | Status: AC
  Administered 2018-04-02: 03:00:00 via INTRAVENOUS

## 2018-04-02 MED ORDER — IOHEXOL 300 MG/ML  SOLN
100.0000 mL | Freq: Once | INTRAMUSCULAR | Status: AC | PRN
  Administered 2018-04-02: 100 mL via INTRAVENOUS

## 2018-04-02 MED ORDER — SODIUM CHLORIDE 0.9 % IV BOLUS
1000.0000 mL | Freq: Once | INTRAVENOUS | Status: AC
  Administered 2018-04-02: 1000 mL via INTRAVENOUS

## 2018-04-02 MED ORDER — ALBUMIN HUMAN 5 % IV SOLN
INTRAVENOUS | Status: AC
  Filled 2018-04-02: qty 250

## 2018-04-02 MED ORDER — ACETAMINOPHEN 325 MG PO TABS
650.0000 mg | ORAL_TABLET | Freq: Four times a day (QID) | ORAL | Status: DC | PRN
  Administered 2018-04-02: 650 mg via ORAL
  Filled 2018-04-02: qty 2

## 2018-04-02 MED ORDER — MORPHINE SULFATE (PF) 2 MG/ML IV SOLN
1.0000 mg | INTRAVENOUS | Status: DC | PRN
  Administered 2018-04-02 – 2018-04-08 (×10): 1 mg via INTRAVENOUS
  Filled 2018-04-02 (×11): qty 1

## 2018-04-02 MED ORDER — ROCURONIUM BROMIDE 50 MG/5ML IV SOSY
PREFILLED_SYRINGE | INTRAVENOUS | Status: DC | PRN
  Administered 2018-04-02: 40 mg via INTRAVENOUS

## 2018-04-02 MED ORDER — SUGAMMADEX SODIUM 200 MG/2ML IV SOLN
INTRAVENOUS | Status: DC | PRN
  Administered 2018-04-02: 200 mg via INTRAVENOUS

## 2018-04-02 MED ORDER — PIPERACILLIN-TAZOBACTAM 3.375 G IVPB
3.3750 g | Freq: Three times a day (TID) | INTRAVENOUS | Status: DC
  Administered 2018-04-02 – 2018-04-07 (×16): 3.375 g via INTRAVENOUS
  Filled 2018-04-02 (×13): qty 50

## 2018-04-02 MED ORDER — LACTATED RINGERS IV SOLN
INTRAVENOUS | Status: DC | PRN
  Administered 2018-04-02: 06:00:00 via INTRAVENOUS

## 2018-04-02 MED ORDER — ONDANSETRON HCL 4 MG/2ML IJ SOLN
INTRAMUSCULAR | Status: AC
  Filled 2018-04-02: qty 2

## 2018-04-02 MED ORDER — FENTANYL CITRATE (PF) 100 MCG/2ML IJ SOLN: 25.0000 ug | INTRAMUSCULAR | Status: DC | PRN

## 2018-04-02 SURGICAL SUPPLY — 29 items
BNDG GAUZE ELAST 4 BULKY (GAUZE/BANDAGES/DRESSINGS) ×3 IMPLANT
CANISTER SUCT 3000ML PPV (MISCELLANEOUS) ×3 IMPLANT
COVER SURGICAL LIGHT HANDLE (MISCELLANEOUS) ×3 IMPLANT
COVER WAND RF STERILE (DRAPES) ×3 IMPLANT
DRAPE LAPAROSCOPIC ABDOMINAL (DRAPES) IMPLANT
DRAPE LAPAROTOMY T 98X78 PEDS (DRAPES) IMPLANT
DRAPE UTILITY XL STRL (DRAPES) ×3 IMPLANT
DRSG PAD ABDOMINAL 8X10 ST (GAUZE/BANDAGES/DRESSINGS) ×6 IMPLANT
ELECT CAUTERY BLADE 6.4 (BLADE) ×3 IMPLANT
ELECT REM PT RETURN 9FT ADLT (ELECTROSURGICAL) ×3
ELECTRODE REM PT RTRN 9FT ADLT (ELECTROSURGICAL) ×1 IMPLANT
GAUZE SPONGE 4X4 12PLY STRL (GAUZE/BANDAGES/DRESSINGS) ×3 IMPLANT
GLOVE BIO SURGEON STRL SZ7.5 (GLOVE) ×3 IMPLANT
GLOVE INDICATOR 8.0 STRL GRN (GLOVE) ×3 IMPLANT
GOWN STRL REUS W/ TWL LRG LVL3 (GOWN DISPOSABLE) ×1 IMPLANT
GOWN STRL REUS W/ TWL XL LVL3 (GOWN DISPOSABLE) ×1 IMPLANT
GOWN STRL REUS W/TWL LRG LVL3 (GOWN DISPOSABLE) ×2
GOWN STRL REUS W/TWL XL LVL3 (GOWN DISPOSABLE) ×2
KIT BASIN OR (CUSTOM PROCEDURE TRAY) ×3 IMPLANT
KIT TURNOVER KIT B (KITS) ×3 IMPLANT
NS IRRIG 1000ML POUR BTL (IV SOLUTION) ×3 IMPLANT
PACK GENERAL/GYN (CUSTOM PROCEDURE TRAY) ×3 IMPLANT
PAD ARMBOARD 7.5X6 YLW CONV (MISCELLANEOUS) ×3 IMPLANT
PENCIL SMOKE EVACUATOR (MISCELLANEOUS) ×3 IMPLANT
SUT ETHILON 2 0 FS 18 (SUTURE) ×3 IMPLANT
SWAB COLLECTION DEVICE MRSA (MISCELLANEOUS) IMPLANT
SWAB CULTURE ESWAB REG 1ML (MISCELLANEOUS) IMPLANT
TOWEL OR 17X24 6PK STRL BLUE (TOWEL DISPOSABLE) ×3 IMPLANT
TOWEL OR 17X26 10 PK STRL BLUE (TOWEL DISPOSABLE) ×3 IMPLANT

## 2018-04-02 NOTE — Progress Notes (Signed)
Patient admitted from ED via bed. Patient is alert and oriented x1. Vital signs is stable. Patient is on progressive care monitor. Iv in place and running fluid. Skin assessment done with another nurse. Bed in low position and call bell in reach.

## 2018-04-02 NOTE — Anesthesia Preprocedure Evaluation (Signed)
Anesthesia Evaluation  Patient identified by MRN, date of birth, ID band Patient confused    Reviewed: Allergy & Precautions, NPO status , Patient's Chart, lab work & pertinent test results  History of Anesthesia Complications Negative for: history of anesthetic complications  Airway Mallampati: II  TM Distance: >3 FB Neck ROM: Full    Dental  (+) Poor Dentition   Pulmonary neg pulmonary ROS,    breath sounds clear to auscultation       Cardiovascular negative cardio ROS   Rhythm:Regular Rate:Normal     Neuro/Psych Depression Dementia Advanced parkinson's: falls, hallucinations    GI/Hepatic negative GI ROS, Neg liver ROS,   Endo/Other  negative endocrine ROS  Renal/GU negative Renal ROS     Musculoskeletal   Abdominal   Peds  Hematology  (+) Blood dyscrasia (Hb 9.7), anemia ,   Anesthesia Other Findings   Reproductive/Obstetrics                             Anesthesia Physical Anesthesia Plan  ASA: III and emergent  Anesthesia Plan: General   Post-op Pain Management:    Induction: Intravenous and Cricoid pressure planned  PONV Risk Score and Plan: 2 and Ondansetron and Treatment may vary due to age or medical condition  Airway Management Planned: Oral ETT  Additional Equipment:   Intra-op Plan:   Post-operative Plan: Extubation in OR  Informed Consent: I have reviewed the patients History and Physical, chart, labs and discussed the procedure including the risks, benefits and alternatives for the proposed anesthesia with the patient or authorized representative who has indicated his/her understanding and acceptance.     Dental advisory given and Consent reviewed with POA  Plan Discussed with: Surgeon and CRNA  Anesthesia Plan Comments: (Plan routine monitors, GETA Discussed with wife by telephone)        Anesthesia Quick Evaluation

## 2018-04-02 NOTE — Consult Note (Addendum)
WOC Consult requested for sacrum wound.  Pt just arrived to the floor from the OR where he had a surgical debridement performed and dressing was changed at that time.  Surgical team following for assessment and plan of care, please refer to their team for further questions.   WOC will plan to assess wound tomorrow am with dressing change. Cammie Mcgee MSN, RN, CWOCN, [place], CNS [phone]

## 2018-04-02 NOTE — Progress Notes (Signed)
Ok per Dr Hanley Ben to resume home parkinson's meds:  Sinemet 25/100mg  1.5 tabs five times/day Entecapone 200mg  five times/day Artane 1mg  TID with meals  [name], PharmD, BCIDP, AAHIVP, CPP Infectious Disease Pharmacist 04/02/2018 1:46 PM

## 2018-04-02 NOTE — Progress Notes (Signed)
Patient didn't get the tap water enema due to his procedure in the OR. Day shift nurse aware.

## 2018-04-02 NOTE — Consult Note (Addendum)
Consultation Note Date: 04/02/2018   Patient Name: Mark Mathews  DOB: 07-05-1940  MRN: [id number]  Age / Sex: 78 y.o., male  PCP: Kelton Pillar, MD Referring Physician: Aline August, MD  Reason for Consultation: Establishing goals of care  HPI/Patient Profile: 78 y.o. male  with past medical history of advanced Parkinson's disease, spinal stenosis admitted on 04/01/2018 with sacral wound causing sepsis and requiring surgical debridement 04/02/18.   Clinical Assessment and Goals of Care: I met today at Mark Mathews' bedside with his wife. She has many concerns with nursing care and explains that she has not had the best experiences previously with herself as a patient at [facility] (I discussed concerns with Retail buyer). She is very concerned with nurses coming to clean Mark Mathews and giving suppository and not acknowledging his surgical site appropriateness of this care.   We discussed the progression of his Parkinson's over the past ~1-1.5 month especially where he has become bedbound, more agitation, spits out medications, difficulty with eating/drinking at times, few moment of meaningful interactions, awake all night moaning, sleeping in daytime. She has been caring for him alone at home (with some assistance from her son and daughter-in-law). However, she has her own health conditions and this has become so very difficult for her to meet his needs. She had reached out to his doctors for assistance and they kept referring her to geri psych but she could not even get Mark Mathews out of the house to an appointment.   We discuss GOC and Mrs. Wile confirms DNR and would not want artificial feeding. She does not want him to suffer or be in pain. She says she would be interested in pursuing further surgical debridement as needed acknowledging his ability to heal will be a challenge (bedbound, albumin 2.4). I urged  her to consider the benefit vs risk to put him through further surgery knowing the expectations and disease trajectory.   She is hopeful for some level of recovery or stabilization and then rehab stay. We discussed at length and she would like hospice at home after rehab. However, he is high risk for acute decline and will need to follow for progression over hospital course for appropriate options but I do believe hospice would be benefical and wife seems open to hospice.   Primary Decision Maker NEXT OF KIN wife    SUMMARY OF RECOMMENDATIONS   - DNR - Hopeful for rehab followed by home with hospice - No artificial feeding - Would consider further surgical debridement if indicated (would need further discussion)  Code Status/Advance Care Planning:  DNR   Symptom Management:   Pain with sacral wound: Scheduled Tylenol 1000 mg po TID. This will assist with fever as well. He may require more pain medication especially with dressing changes and recommend OxyIR 2.5-5 mg po every 8 hours prn.   SLP eval: No overt signs of swallowing difficulty but has been recently spitting out pills and decreased intake.   Palliative Prophylaxis:   Aspiration, Bowel Regimen, Delirium Protocol,  Frequent Pain Assessment, Oral Care, Palliative Wound Care and Turn Reposition  Additional Recommendations (Limitations, Scope, Preferences):  No Artificial Feeding  Psycho-social/Spiritual:   Desire for further Chaplaincy support:yes  Additional Recommendations: Caregiving  Support/Resources, Education on Hospice and Grief/Bereavement Support  Prognosis:   < 6 months and high risk for acute decline. Potential for much poorer prognosis and even hospice facility with further decline. TBD.   Discharge Planning: To Be Determined      Primary Diagnoses: Present on Admission: **None**   I have reviewed the medical record, interviewed the patient and family, and examined the patient. The following  aspects are pertinent.  Past Medical History:  Diagnosis Date  . Other and unspecified hyperlipidemia   . Paralysis agitans ([place])   . Parkinson's disease ([place]) 10/12/2012  . Spinal stenosis    Social History   Socioeconomic History  . Marital status: Married    Spouse name: [name]  . Number of children: 2  . Years of education: college  . Highest education level: Not on file  Occupational History    Comment: retired  Scientific laboratory technician  . Financial resource strain: Not on file  . Food insecurity:    Worry: Not on file    Inability: Not on file  . Transportation needs:    Medical: Not on file    Non-medical: Not on file  Tobacco Use  . Smoking status: Never Smoker  . Smokeless tobacco: Never Used  Substance and Sexual Activity  . Alcohol use: No    Alcohol/week: 0.0 standard drinks  . Drug use: No  . Sexual activity: Not on file  Lifestyle  . Physical activity:    Days per week: Not on file    Minutes per session: Not on file  . Stress: Not on file  Relationships  . Social connections:    Talks on phone: Not on file    Gets together: Not on file    Attends religious service: Not on file    Active member of club or organization: Not on file    Attends meetings of clubs or organizations: Not on file    Relationship status: Not on file  Other Topics Concern  . Not on file  Social History Narrative   Consumes caffeine rarely,1 cup daily   Family History  Problem Relation Age of Onset  . Heart failure Father    Scheduled Meds: . carbidopa-levodopa  1.5 tablet Oral 5 X Daily  . entacapone  200 mg Oral 5 X Daily  . trihexyphenidyl  1 mg Oral TID WC   Continuous Infusions: . albumin human Stopped (04/02/18 1301)  . piperacillin-tazobactam (ZOSYN)  IV 3.375 g (04/02/18 1334)  . vancomycin     PRN Meds:.acetaminophen **OR** acetaminophen, morphine injection No Known Allergies Review of Systems  Unable to perform ROS: Acuity of condition    Physical  Exam Vitals signs and nursing note reviewed.  Constitutional:      Appearance: He is ill-appearing.     Comments: Thin, frail   Cardiovascular:     Rate and Rhythm: Regular rhythm.  Pulmonary:     Effort: Pulmonary effort is normal. No tachypnea, accessory muscle usage or respiratory distress.  Abdominal:     General: Abdomen is flat.  Neurological:     Mental Status: Mental status is at baseline. He is lethargic, disoriented and confused.     Vital Signs: BP (!) 102/56 (BP Location: Left Arm)   Pulse 69   Temp 99.9 F (37.7  C) (Axillary)   Resp 20   Ht 5' 6"  (1.676 m)   Wt 63.9 kg   SpO2 100%   BMI 22.74 kg/m  Pain Scale: Faces       SpO2: SpO2: 100 % O2 Device:SpO2: 100 % O2 Flow Rate: .   IO: Intake/output summary:   Intake/Output Summary (Last 24 hours) at 04/02/2018 1600 Last data filed at 04/02/2018 1324 Gross per 24 hour  Intake 750 ml  Output 20 ml  Net 730 ml    LBM: Last BM Date: 04/02/18 Baseline Weight: Weight: 70.3 kg Most recent weight: Weight: 63.9 kg     Palliative Assessment/Data: 30% at best   Flowsheet Rows     Most Recent Value  Intake Tab  Referral Department  Hospitalist  Unit at Time of Referral  Intermediate Care Unit  Palliative Care Primary Diagnosis  Other (Comment) [infected sacral wound]  Date Notified  04/02/18  Palliative Care Type  New Palliative care  Reason for referral  Clarify Goals of Care  Date of Admission  04/01/18  # of days IP prior to Palliative referral  1  Clinical Assessment  Psychosocial & Spiritual Assessment  Palliative Care Outcomes      Time In: 1410 Time Out: 1530 Time Total: 80 min Greater than 50%  of this time was spent counseling and coordinating care related to the above assessment and plan.  Signed by: Vinie Sill, NP Palliative Medicine Team Pager # [phone] (M-F 8a-5p) Team Phone # [phone] (Nights/Weekends)

## 2018-04-02 NOTE — ED Notes (Signed)
Patient soiled linens/diaper; pt cleaned and linens changed.

## 2018-04-02 NOTE — Progress Notes (Signed)
Patient dressing sold slightly on the conner after patient has bowel movement. Removed plastic and ABD pad and replaced with a new  ABD and tape. Didn't remove originally surgical packing.

## 2018-04-02 NOTE — Progress Notes (Signed)
Pharmacy Antibiotic Note  Mark Mathews is a 78 y.o. male admitted on 04/01/2018 with concern for sepsis resulting from developing sacral wound.  Pharmacy has been consulted for Vancocin and Zosyn dosing.  Plan: Vancomycin 1500mg  IV Q24H. Goal AUC 400-550.  Expected AUC 540.  SCr used 0.93. Zosyn 3.375g IV Q8H (4-hour infusion).  Height: 5\' 7"  (170.2 cm) Weight: 155 lb (70.3 kg) IBW/kg (Calculated) : 66.1  Temp (24hrs), Avg:100.7 F (38.2 C), Min:99.7 F (37.6 C), Max:101.7 F (38.7 C)  Recent Labs  Lab 04/01/18 2132 04/01/18 2218 04/01/18 2338  WBC 21.0*  --   --   CREATININE 0.93  --   --   LATICACIDVEN  --  2.2* 2.3*    Estimated Creatinine Clearance: 61.2 mL/min (by C-G formula based on SCr of 0.93 mg/dL).    No Known Allergies   Thank you for allowing pharmacy to be a part of this patient's care.  Vernard Gambles, PharmD, BCPS  04/02/2018 12:29 AM

## 2018-04-02 NOTE — Anesthesia Procedure Notes (Signed)
Procedure Name: Intubation Date/Time: 04/02/2018 6:12 AM Performed by: Babs Bertin, CRNA Pre-anesthesia Checklist: Patient identified, Emergency Drugs available, Suction available and Patient being monitored Patient Re-evaluated:Patient Re-evaluated prior to induction Oxygen Delivery Method: Circle System Utilized Preoxygenation: Pre-oxygenation with 100% oxygen Induction Type: IV induction Ventilation: Mask ventilation without difficulty Laryngoscope Size: Mac and 3 Grade View: Grade I Tube type: Oral Tube size: 7.5 mm Number of attempts: 1 Airway Equipment and Method: Stylet and Oral airway Placement Confirmation: ETT inserted through vocal cords under direct vision,  positive ETCO2 and breath sounds checked- equal and bilateral Secured at: 22 cm Tube secured with: Tape Dental Injury: Teeth and Oropharynx as per pre-operative assessment

## 2018-04-02 NOTE — Consult Note (Addendum)
CC/Reason for consult: Necrotic sacral wound - consult by hospitalist Dr. Loney Loh  HPI: Mark Mathews is an 78 y.o. male with hx of Parkinson's disease, spinal stenosis whom presented to the ED this evening with his wife - complaining of a sacral wound that they first noticed one week ago. Most of the history has been provided by his wife as he has advanced Parkinson's disease. She reports that up until Thanksgiving 2019, he was ambulatory but suffered from multiple falls. Since November, he has been much more restricted in activity and is bed/chair bound. One week ago a quarter sized area of tissue breakdown was noted; this progressed over the last week to an area the size of an apple. She reports he has been more lethargic the past 2-3 days. Denies known fever/chills.   WBC in ED was 21; he was subsequently admitted to medicine and started on broad spec IV abx; medicine service ordered a CT scan which shows moderate subQ edema overlying midline and left gluteal region, gas within subQ soft tissues overlying sacrococcygeal region/L gluteal regiion. Possibility of necrotizing infection is raised.   We were subsequently asked to see  Past Medical History:  Diagnosis Date  . Other and unspecified hyperlipidemia   . Paralysis agitans (HCC)   . Parkinson's disease (HCC) 10/12/2012  . Spinal stenosis     Past Surgical History:  Procedure Laterality Date  . APPENDECTOMY    . CATARACT EXTRACTION  2016   x3  . INGUINAL HERNIA REPAIR Bilateral 03/06/2009  . KIDNEY SURGERY      Family History  Problem Relation Age of Onset  . Heart failure Father     Social:  reports that he has never smoked. He has never used smokeless tobacco. He reports that he does not drink alcohol or use drugs.  Allergies: No Known Allergies  Medications: I have reviewed the patient's current medications.  Results for orders placed or performed during the hospital encounter of 04/01/18 (from the past 48 hour(s))    Comprehensive metabolic panel     Status: Abnormal   Collection Time: 04/01/18  9:32 PM  Result Value Ref Range   Sodium 139 135 - 145 mmol/L   Potassium 3.5 3.5 - 5.1 mmol/L   Chloride 101 98 - 111 mmol/L   CO2 23 22 - 32 mmol/L   Glucose, Bld 113 (H) 70 - 99 mg/dL   BUN 23 8 - 23 mg/dL   Creatinine, Ser 4.09 0.61 - 1.24 mg/dL   Calcium 8.5 (L) 8.9 - 10.3 mg/dL   Total Protein 6.6 6.5 - 8.1 g/dL   Albumin 2.4 (L) 3.5 - 5.0 g/dL   AST 44 (H) 15 - 41 U/L   ALT 9 0 - 44 U/L   Alkaline Phosphatase 53 38 - 126 U/L   Total Bilirubin 0.8 0.3 - 1.2 mg/dL   GFR calc non Af Amer >60 >60 mL/min   GFR calc Af Amer >60 >60 mL/min   Anion gap 15 5 - 15    Comment: Performed at [facility], [address], [place], [place] [address]  CBC WITH DIFFERENTIAL     Status: Abnormal   Collection Time: 04/01/18  9:32 PM  Result Value Ref Range   WBC 21.0 (H) 4.0 - 10.5 K/uL   RBC 3.60 (L) 4.22 - 5.81 MIL/uL   Hemoglobin 11.3 (L) 13.0 - 17.0 g/dL   HCT 47.8 (L) 29.5 - 62.1 %   MCV 99.7 80.0 - 100.0 fL  MCH 31.4 26.0 - 34.0 pg   MCHC 31.5 30.0 - 36.0 g/dL   RDW 16.112.4 09.611.5 - 04.515.5 %   Platelets 315 150 - 400 K/uL   nRBC 0.0 0.0 - 0.2 %   Neutrophils Relative % 87 %   Neutro Abs 18.3 (H) 1.7 - 7.7 K/uL   Lymphocytes Relative 6 %   Lymphs Abs 1.3 0.7 - 4.0 K/uL   Monocytes Relative 4 %   Monocytes Absolute 0.9 0.1 - 1.0 K/uL   Eosinophils Relative 1 %   Eosinophils Absolute 0.1 0.0 - 0.5 K/uL   Basophils Relative 0 %   Basophils Absolute 0.0 0.0 - 0.1 K/uL   Immature Granulocytes 2 %   Abs Immature Granulocytes 0.33 (H) 0.00 - 0.07 K/uL    Comment: Performed at [facility], [address] [place], [place] [address]  Lactic acid, plasma     Status: Abnormal   Collection Time: 04/01/18 10:18 PM  Result Value Ref Range   Lactic Acid, Venous 2.2 (HH) 0.5 - 1.9 mmol/L    Comment: CRITICAL RESULT CALLED TO, READ BACK BY AND VERIFIED WITH: Barnett AbuWISEMAN T,RN 04/01/18 2248 WAYK Performed  at [facility], [address] [place], [place] [address]   Lactic acid, plasma     Status: Abnormal   Collection Time: 04/01/18 11:38 PM  Result Value Ref Range   Lactic Acid, Venous 2.3 (HH) 0.5 - 1.9 mmol/L    Comment: CRITICAL RESULT CALLED TO, READ BACK BY AND VERIFIED WITH: Barnett AbuWISEMAN T,RN 04/02/18 0007 WAYK Performed at [facility], [address] [place], [place] [address]   Lactic acid, plasma     Status: None   Collection Time: 04/02/18  3:35 AM  Result Value Ref Range   Lactic Acid, Venous 1.3 0.5 - 1.9 mmol/L    Comment: Performed at [facility], [address] [place], [place] [address]  CBC     Status: Abnormal   Collection Time: 04/02/18  3:45 AM  Result Value Ref Range   WBC 17.8 (H) 4.0 - 10.5 K/uL   RBC 3.08 (L) 4.22 - 5.81 MIL/uL   Hemoglobin 9.7 (L) 13.0 - 17.0 g/dL   HCT 65.729.3 (L) 84.639.0 - 96.252.0 %   MCV 95.1 80.0 - 100.0 fL   MCH 31.5 26.0 - 34.0 pg   MCHC 33.1 30.0 - 36.0 g/dL   RDW 95.212.4 84.111.5 - 32.415.5 %   Platelets 283 150 - 400 K/uL   nRBC 0.0 0.0 - 0.2 %    Comment: Performed at [facility], [address] [place], [place] [address]    Ct Abdomen Pelvis W Contrast  Result Date: 04/02/2018 CLINICAL DATA:  Sepsis EXAM: CT ABDOMEN AND PELVIS WITH CONTRAST TECHNIQUE: Multidetector CT imaging of the abdomen and pelvis was performed using the standard protocol following bolus administration of intravenous contrast. CONTRAST:  100mL OMNIPAQUE IOHEXOL 300 MG/ML  SOLN COMPARISON:  08/12/2017 FINDINGS: Lower chest: Lung bases demonstrate no acute consolidation or effusion. Heart size is normal. Motion degraded study Hepatobiliary: No focal liver abnormality is seen. No gallstones, gallbladder wall thickening, or biliary dilatation. Pancreas: Unremarkable. No pancreatic ductal dilatation or surrounding inflammatory changes. Spleen: Normal in size without focal abnormality. Adrenals/Urinary Tract: Adrenal glands are normal. No hydronephrosis.  Subcentimeter hypodensity mid left kidney too small to further characterize. Bladder normal Stomach/Bowel: Stomach is nonenlarged. No dilated small bowel. Large volume of stool in the colon. Moderate to large retained  feces at the rectum. Vascular/Lymphatic: Moderate aortic atherosclerosis. No aneurysm. No significantly enlarged lymph nodes. Reproductive: Prostate is unremarkable. Other: Negative for free air or free fluid.  Mild presacral edema. Musculoskeletal: Asymmetric skin thickening and subcutaneous edema over the left gluteal region. Asymmetric enlargement of the left gluteal muscles. Gas within the midline and left paramedian soft tissues overlying the sacrococcygeal region and left gluteal region. Scoliosis of the spine with degenerative changes. Chronic severe compression fracture of L3. IMPRESSION: 1. Motion degraded study which limits evaluation. 2. Skin thickening and moderate subcutaneous edema overlying the midline and left gluteal region, suspect for cellulitis. Gas within the subcutaneous soft tissues overlying the sacrococcygeal region and left gluteal region, presumably corresponding to the history of wound. Possibility of gas-forming infection/necrotizing infection is raised. No gross underlying osseous destruction. Mild asymmetric enlargement of the left gluteal muscles could be secondary to muscle edema/possible myositis. 3. Large volume of stool in the colon with large retained feces at the rectum suggesting constipation Electronically Signed   By: Jasmine Pang M.D.   On: 04/02/2018 02:09    ROS - all of the below systems have been reviewed with the patient and positives are indicated with bold text General: chills, fever or night sweats Eyes: blurry vision or double vision ENT: epistaxis or sore throat Allergy/Immunology: itchy/watery eyes or nasal congestion Hematologic/Lymphatic: bleeding problems, blood clots or swollen lymph nodes Endocrine: temperature intolerance or  unexpected weight changes Breast: new or changing breast lumps or nipple discharge Resp: cough, shortness of breath, or wheezing CV: chest pain or dyspnea on exertion GI: as per HPI GU: dysuria, trouble voiding, or hematuria MSK: joint pain or joint stiffness Neuro: TIA or stroke symptoms Derm: pruritus and skin lesion changes Psych: anxiety and depression  PE Blood pressure 114/60, pulse 92, temperature (!) 101.4 F (38.6 C), temperature source Axillary, resp. rate 12, height 5\' 6"  (1.676 m), weight 63.9 kg, SpO2 97 %. Constitutional: NAD; conversant; no deformities Eyes: Moist conjunctiva; no lid lag; anicteric; PERRL Neck: Trachea midline; no thyromegaly Lungs: Normal respiratory effort; no tactile fremitus CV: RRR; no palpable thrills; no pitting edema GI: Abd soft, NT/ND; no palpable hepatosplenomegaly MSK: Mild contractures of feed; no clubbing/cyanosis Skin: Sacral decubitus wound with necrotic skin and some erythema surrounding; foul smelling Psychiatric: Appropriate affect; alert and oriented x3 Lymphatic: No palpable cervical or axillary lymphadenopathy  Results for orders placed or performed during the hospital encounter of 04/01/18 (from the past 48 hour(s))  Comprehensive metabolic panel     Status: Abnormal   Collection Time: 04/01/18  9:32 PM  Result Value Ref Range   Sodium 139 135 - 145 mmol/L   Potassium 3.5 3.5 - 5.1 mmol/L   Chloride 101 98 - 111 mmol/L   CO2 23 22 - 32 mmol/L   Glucose, Bld 113 (H) 70 - 99 mg/dL   BUN 23 8 - 23 mg/dL   Creatinine, Ser 1.61 0.61 - 1.24 mg/dL   Calcium 8.5 (L) 8.9 - 10.3 mg/dL   Total Protein 6.6 6.5 - 8.1 g/dL   Albumin 2.4 (L) 3.5 - 5.0 g/dL   AST 44 (H) 15 - 41 U/L   ALT 9 0 - 44 U/L   Alkaline Phosphatase 53 38 - 126 U/L   Total Bilirubin 0.8 0.3 - 1.2 mg/dL   GFR calc non Af Amer >60 >60 mL/min   GFR calc Af Amer >60 >60 mL/min   Anion gap 15 5 - 15    Comment: Performed at  [facility], [address], [place], [place] [address]  CBC WITH DIFFERENTIAL     Status: Abnormal   Collection Time: 04/01/18  9:32 PM  Result Value Ref Range   WBC 21.0 (H) 4.0 - 10.5 K/uL   RBC 3.60 (L) 4.22 - 5.81 MIL/uL   Hemoglobin 11.3 (L) 13.0 - 17.0 g/dL   HCT 19.1 (L) 47.8 - 29.5 %   MCV 99.7 80.0 - 100.0 fL   MCH 31.4 26.0 - 34.0 pg   MCHC 31.5 30.0 - 36.0 g/dL   RDW 62.1 30.8 - 65.7 %   Platelets 315 150 - 400 K/uL   nRBC 0.0 0.0 - 0.2 %   Neutrophils Relative % 87 %   Neutro Abs 18.3 (H) 1.7 - 7.7 K/uL   Lymphocytes Relative 6 %   Lymphs Abs 1.3 0.7 - 4.0 K/uL   Monocytes Relative 4 %   Monocytes Absolute 0.9 0.1 - 1.0 K/uL   Eosinophils Relative 1 %   Eosinophils Absolute 0.1 0.0 - 0.5 K/uL   Basophils Relative 0 %   Basophils Absolute 0.0 0.0 - 0.1 K/uL   Immature Granulocytes 2 %   Abs Immature Granulocytes 0.33 (H) 0.00 - 0.07 K/uL    Comment: Performed at [facility], [address], [place], [place] [address]  Lactic acid, plasma     Status: Abnormal   Collection Time: 04/01/18 10:18 PM  Result Value Ref Range   Lactic Acid, Venous 2.2 (HH) 0.5 - 1.9 mmol/L    Comment: CRITICAL RESULT CALLED TO, READ BACK BY AND VERIFIED WITH: Barnett Abu T,RN 04/01/18 2248 WAYK Performed at [facility], [address], [place], [place] [address]   Lactic acid, plasma     Status: Abnormal   Collection Time: 04/01/18 11:38 PM  Result Value Ref Range   Lactic Acid, Venous 2.3 (HH) 0.5 - 1.9 mmol/L    Comment: CRITICAL RESULT CALLED TO, READ BACK BY AND VERIFIED WITH: Barnett Abu T,RN 04/02/18 0007 WAYK Performed at [facility], [address] [place], [place] [address]   Lactic acid, plasma     Status: None   Collection Time: 04/02/18  3:35 AM  Result Value Ref Range   Lactic Acid, Venous 1.3 0.5 - 1.9 mmol/L    Comment: Performed at [facility], [address] [place], [place] [address]  CBC     Status: Abnormal   Collection Time: 04/02/18  3:45 AM  Result Value Ref Range     WBC 17.8 (H) 4.0 - 10.5 K/uL   RBC 3.08 (L) 4.22 - 5.81 MIL/uL   Hemoglobin 9.7 (L) 13.0 - 17.0 g/dL   HCT 72.5 (L) 36.6 - 44.0 %   MCV 95.1 80.0 - 100.0 fL   MCH 31.5 26.0 - 34.0 pg   MCHC 33.1 30.0 - 36.0 g/dL   RDW 34.7 42.5 - 95.6 %   Platelets 283 150 - 400 K/uL   nRBC 0.0 0.0 - 0.2 %    Comment: Performed at [facility], [address], [place], [place] [address]    Ct Abdomen Pelvis W Contrast  Result Date: 04/02/2018 CLINICAL DATA:  Sepsis EXAM: CT ABDOMEN AND PELVIS WITH CONTRAST TECHNIQUE: Multidetector CT imaging of the abdomen and pelvis was performed using the standard protocol following bolus administration of intravenous contrast. CONTRAST:  OMNIPAQUE IOHEXOL 300 MG/ML  SOLN COMPARISON:  08/12/2017 FINDINGS: Lower chest: Lung bases demonstrate no acute  consolidation or effusion. Heart size is normal. Motion degraded study Hepatobiliary: No focal liver abnormality is seen. No gallstones, gallbladder wall thickening, or biliary dilatation. Pancreas: Unremarkable. No pancreatic ductal dilatation or surrounding inflammatory changes. Spleen: Normal in size without focal abnormality. Adrenals/Urinary Tract: Adrenal glands are normal. No hydronephrosis. Subcentimeter hypodensity mid left kidney too small to further characterize. Bladder normal Stomach/Bowel: Stomach is nonenlarged. No dilated small bowel. Large volume of stool in the colon. Moderate to large retained feces at the rectum. Vascular/Lymphatic: Moderate aortic atherosclerosis. No aneurysm. No significantly enlarged lymph nodes. Reproductive: Prostate is unremarkable. Other: Negative for free air or free fluid.  Mild presacral edema. Musculoskeletal: Asymmetric skin thickening and subcutaneous edema over the left gluteal region. Asymmetric enlargement of the left gluteal muscles. Gas within the midline and left paramedian soft tissues overlying the sacrococcygeal region and left gluteal region. Scoliosis of the  spine with degenerative changes. Chronic severe compression fracture of L3. IMPRESSION: 1. Motion degraded study which limits evaluation. 2. Skin thickening and moderate subcutaneous edema overlying the midline and left gluteal region, suspect for cellulitis. Gas within the subcutaneous soft tissues overlying the sacrococcygeal region and left gluteal region, presumably corresponding to the history of wound. Possibility of gas-forming infection/necrotizing infection is raised. No gross underlying osseous destruction. Mild asymmetric enlargement of the left gluteal muscles could be secondary to muscle edema/possible myositis. 3. Large volume of stool in the colon with large retained feces at the rectum suggesting constipation Electronically Signed   By: Jasmine Pang M.D.   On: 04/02/2018 02:09   A/P: JAQUELL SEDDON is an 78 y.o. male with hx of Parkinson's disease - now advanced - with necrotic sacral decubitus wound  -His wife has been the person making his decisions given his advancing Parkinson's disease. I had a long discussion with both him and her. -The anatomy and physiology of the sacral region was discussed at length with the patient and his wife. The pathophysiology and etiology of decubitus ulcers and necrotic wounds was discussed at length as well. -We discussed operative debridement of the sacral wound with removal of devitalized tissue for source control. We discussed that without this the infection could progress and ultimately take his life. They have opted to pursue surgical debridement. -The procedure, material risks (including, but not limited to, pain, bleeding, infection, scarring, need for blood transfusion, damage to anal sphincter, incontinence of gas and/or stool, need for additional procedures and/or further debridement, recurrence, pneumonia, heart attack, stroke, death) benefits and alternatives to surgery were discussed at length. We discussed that he would have a large open  wound. The patient and his wife's questions were answered to their satisfaction, they voiced understanding and elected to proceed with surgery. Additionally, we discussed typical postoperative expectations and the recovery process. -Phone consent was obtained with his wife, Brantley Wiley at her home number listed  Stephanie Coup. Cliffton Asters, M.D. [facility]

## 2018-04-02 NOTE — Anesthesia Postprocedure Evaluation (Signed)
Anesthesia Post Note  Patient: Mark Mathews  Procedure(s) Performed: INCISION AND DRAINAGE debridement of sacral decubitis wound (N/A Back)     Patient location during evaluation: PACU Anesthesia Type: General Level of consciousness: awake and alert and confused (mental status unchanged from pre op) Pain management: pain level controlled Vital Signs Assessment: post-procedure vital signs reviewed and stable Respiratory status: spontaneous breathing, nonlabored ventilation and respiratory function stable Cardiovascular status: blood pressure returned to baseline and stable Postop Assessment: no apparent nausea or vomiting Anesthetic complications: no    Last Vitals:  Vitals:   04/02/18 0658 04/02/18 0700  BP: (!) 100/38 (!) 95/40  Pulse: 81 78  Resp: 20 17  Temp: 37.2 C   SpO2: 98% 100%    Last Pain:  Vitals:   04/02/18 0431  TempSrc: Axillary                 Laraina Sulton,E. Cage Gupton

## 2018-04-02 NOTE — Op Note (Signed)
04/01/2018 - 04/02/2018  6:40 AM  PATIENT:  Mark Mathews  78 y.o. male  Patient Care Team: Maurice SmallGriffin, Elaine, MD as PCP - General (Family Medicine)  PRE-OPERATIVE DIAGNOSIS:  Necrotic sacral decubitus ulcer with soft tissue infection  POST-OPERATIVE DIAGNOSIS:  Same  PROCEDURE:  Excision of sacral decubitus ulcer with debdridement  SURGEON:  Stephanie Couphristopher M. Kennadie Brenner, MD  ASSISTANT: OR staff  ANESTHESIA:   general  COUNTS:  Sponge, needle and instrument counts were reported correct x2 at the conclusion of the operation.  EBL: 20 cc  DRAINS: None  SPECIMEN: Sacral decubitus ulcer, stitch marks superior  COMPLICATIONS: None  FINDINGS: 15 (L) x 11 (W) x 3.5 (H) cm sacral decubitus ulcer which was necrotic with wet gangrene.   DISPOSITION: PACU in satisfactory condition  INDICATION: Mark Mathews is a very pleasaant 77yoM with hx of Parkinson's disease, spinal stenosis whom presented to the ED this evening with his wife - complaining of a sacral wound that they first noticed one week ago. Most of the history has been provided by his wife as he has advanced Parkinson's disease. She reports that up until Thanksgiving 2019, he was ambulatory but suffered from multiple falls. Since November, he has been much more restricted in activity and is bed/chair bound. One week ago a quarter sized area of tissue breakdown was noted; this progressed over the last week to an area the size of an apple. She reports he has been more lethargic the past 2-3 days. Denies known fever/chills.   WBC in ED was 21; he was subsequently admitted to medicine and started on broad spec IV abx; medicine service ordered a CT scan which shows moderate subQ edema overlying midline and left gluteal region, gas within subQ soft tissues overlying sacrococcygeal region/L gluteal regiion. Possibility of necrotizing infection is raised.   We were subsequently asked to see. On exam he had wet gangrene overlying the sacrum with some  surrounding erythema as well. Given constellation of findings, febrile, appears to have had hypotension on arrival and altered mental status, decision was made to perform emergency debridement.  I discussed everything with both him and his wife.  They opted to pursue surgical intervention for this.  Please refer to notes dictated elsewhere in the medical record for details regarding this discussion.  DESCRIPTION: The patient was identified in preop holding and taken to the OR where he was placed on the operating room table. SCDs were placed. General endotracheal anesthesia was induced without difficulty.  He was then positioned in the left lateral decubitus position.  A beanbag was used.  Pressure points were padded.  He was then prepped and draped in the usual sterile fashion. A surgical timeout was performed indicating the correct patient, procedure, positioning and need for preoperative antibiotics.   Overlying the sacrum, a wet gangrene necrotic ulcer was identified.  This was incised sharply with a knife - excising all necrotic tissue.  This was carried down and the necrotic tissue extended down through the fascia and into some of the muscle overlying the lateral borders of the sacral bone - particularly the left side.  Some of this muscle was excised sharply as well using a knife.  Once all necrotic tissue been excised, the specimen was passed off after being oriented.  1 suture marked superior.  The wound is measured and found to be 15 (L) x 11 (W) x 4 (H) cm in size and extended down into the muscle.  The wound was then irrigated with  saline.  Hemostasis was achieved.  A moist Kerlix, 4 x 4 gauze, and mesh underwear were then used for dressing.  The patient was rolled back supine, awakened from anesthesia, extubated, and transferred to a stretcher for transport to PACU in satisfactory condition.

## 2018-04-02 NOTE — Progress Notes (Signed)
Patient ID: Mark Mathews, male   DOB: Oct 06, 1940, 78 y.o.   MRN: [id number] Patient was admitted early this morning for infected sacral wound.  He was started on IV antibiotics and general surgery was consulted.  He had surgical debridement today.  Patient seen and examined at bedside.  I have reviewed patient's medical records including this morning's H&P, labs and current medications myself.  Continue current treatment plan and repeat a.m. labs.  Follow cultures.

## 2018-04-02 NOTE — H&P (Signed)
History and Physical    Mark HillBilly J Vandermeulen ONG:[id number] DOB: [birth year] DOA: 04/01/2018  PCP: Maurice SmallGriffin, Elaine, MD Patient coming from: Home  Chief Complaint: Sacral wound  HPI: Mark Mathews is a 78 y.o. male with medical history significant of Parkinson's disease, depression being brought into the hospital by family due to concern for a sacral wound.  Wife at bedside states patient has had a sacral wound for the past 10 days which has been getting progressively worse.  States patient is bedridden at baseline.  He has been more lethargic recently and sleeping all day.  No fevers or chills at home.  No nausea or vomiting.  He has been complaining of pain in the sacral region.  Family has not noticed any change in his mental status from baseline.  Review of Systems: As per HPI otherwise 10 point review of systems negative.  Past Medical History:  Diagnosis Date  . Other and unspecified hyperlipidemia   . Paralysis agitans (HCC)   . Parkinson's disease (HCC) 10/12/2012  . Spinal stenosis     Past Surgical History:  Procedure Laterality Date  . APPENDECTOMY    . CATARACT EXTRACTION  2016   x3  . INGUINAL HERNIA REPAIR Bilateral 03/06/2009  . KIDNEY SURGERY       reports that he has never smoked. He has never used smokeless tobacco. He reports that he does not drink alcohol or use drugs.  No Known Allergies  Family History  Problem Relation Age of Onset  . Heart failure Father     Prior to Admission medications   Medication Sig Start Date End Date Taking? Authorizing Provider  carbidopa-levodopa (SINEMET IR) 25-100 MG tablet TAKE ONE AND ONE-HALF TABLETS FIVE TIMES DAILY Patient taking differently: Take 1.5 tablets by mouth 5 (five) times daily.  03/10/18  Yes Huston FoleyAthar, Saima, MD  entacapone (COMTAN) 200 MG tablet Take 1 tablet (200 mg total) by mouth 5 (five) times daily. In lieu of Stalevo 150 mg 02/17/17  Yes Huston FoleyAthar, Saima, MD  ibuprofen (ADVIL,MOTRIN) 200 MG tablet Take 200 mg by  mouth every 6 (six) hours as needed for moderate pain.    Yes [provider]  trihexyphenidyl (ARTANE) 2 MG tablet Take 0.5 tablets (1 mg total) by mouth 3 (three) times daily with meals. 11/24/17  Yes Huston FoleyAthar, Saima, MD  citalopram (CELEXA) 10 MG tablet Take 1 tablet (10 mg total) by mouth daily. Patient not taking: Reported on 04/01/2018 02/16/18   Huston FoleyAthar, Saima, MD    Physical Exam: Vitals:   04/02/18 0000 04/02/18 0030 04/02/18 0229 04/02/18 0431  BP: (!) 102/51 (!) 100/54 114/60   Pulse: 90 92    Resp: 20 15 12    Temp:   100.3 F (37.9 C) (!) 101.4 F (38.6 C)  TempSrc:   Axillary Axillary  SpO2: 100% 97%    Weight:   63.9 kg   Height:   5\' 6"  (1.676 m)     Physical Exam  Constitutional: He appears distressed.  Moaning  Frail elderly male  HENT:  Head: Normocephalic.  Eyes: Right eye exhibits no discharge. Left eye exhibits no discharge.  Neck: No tracheal deviation present.  Cardiovascular: Normal rate, regular rhythm and intact distal pulses.  Pulmonary/Chest: Effort normal and breath sounds normal. No respiratory distress. He has no wheezes. He has no rales.  Abdominal: Soft. Bowel sounds are normal. He exhibits no distension. There is abdominal tenderness. There is guarding. There is no rebound.  Generalized tenderness to palpation with  guarding  Musculoskeletal:        General: No edema.     Comments: Extremely foul-smelling and necrotic sacral wound.  Please see image.  Neurological:  Somnolent Not following commands  Skin: Skin is warm and dry.       Labs on Admission: I have personally reviewed following labs and imaging studies  CBC: Recent Labs  Lab 04/01/18 2132 04/02/18 0345  WBC 21.0* 17.8*  NEUTROABS 18.3*  --   HGB 11.3* 9.7*  HCT 35.9* 29.3*  MCV 99.7 95.1  PLT 315 283   Basic Metabolic Panel: Recent Labs  Lab 04/01/18 2132  NA 139  K 3.5  CL 101  CO2 23  GLUCOSE 113*  BUN 23  CREATININE 0.93  CALCIUM 8.5*    GFR: Estimated Creatinine Clearance: 59.1 mL/min (by C-G formula based on SCr of 0.93 mg/dL). Liver Function Tests: Recent Labs  Lab 04/01/18 2132  AST 44*  ALT 9  ALKPHOS 53  BILITOT 0.8  PROT 6.6  ALBUMIN 2.4*   No results for input(s): LIPASE, AMYLASE in the last 168 hours. No results for input(s): AMMONIA in the last 168 hours. Coagulation Profile: No results for input(s): INR, PROTIME in the last 168 hours. Cardiac Enzymes: No results for input(s): CKTOTAL, CKMB, CKMBINDEX, TROPONINI in the last 168 hours. BNP (last 3 results) No results for input(s): PROBNP in the last 8760 hours. HbA1C: No results for input(s): HGBA1C in the last 72 hours. CBG: No results for input(s): GLUCAP in the last 168 hours. Lipid Profile: No results for input(s): CHOL, HDL, LDLCALC, TRIG, CHOLHDL, LDLDIRECT in the last 72 hours. Thyroid Function Tests: No results for input(s): TSH, T4TOTAL, FREET4, T3FREE, THYROIDAB in the last 72 hours. Anemia Panel: No results for input(s): VITAMINB12, FOLATE, FERRITIN, TIBC, IRON, RETICCTPCT in the last 72 hours. Urine analysis:    Component Value Date/Time   COLORURINE AMBER (A) 02/10/2018 1242   APPEARANCEUR CLEAR 02/10/2018 1242   LABSPEC 1.021 02/10/2018 1242   PHURINE 6.0 02/10/2018 1242   GLUCOSEU NEGATIVE 02/10/2018 1242   HGBUR NEGATIVE 02/10/2018 1242   BILIRUBINUR NEGATIVE 02/10/2018 1242   KETONESUR 5 (A) 02/10/2018 1242   PROTEINUR NEGATIVE 02/10/2018 1242   NITRITE NEGATIVE 02/10/2018 1242   LEUKOCYTESUR NEGATIVE 02/10/2018 1242    Radiological Exams on Admission: Ct Abdomen Pelvis W Contrast  Result Date: 04/02/2018 CLINICAL DATA:  Sepsis EXAM: CT ABDOMEN AND PELVIS WITH CONTRAST TECHNIQUE: Multidetector CT imaging of the abdomen and pelvis was performed using the standard protocol following bolus administration of intravenous contrast. CONTRAST:  100mL OMNIPAQUE IOHEXOL 300 MG/ML  SOLN COMPARISON:  08/12/2017 FINDINGS: Lower  chest: Lung bases demonstrate no acute consolidation or effusion. Heart size is normal. Motion degraded study Hepatobiliary: No focal liver abnormality is seen. No gallstones, gallbladder wall thickening, or biliary dilatation. Pancreas: Unremarkable. No pancreatic ductal dilatation or surrounding inflammatory changes. Spleen: Normal in size without focal abnormality. Adrenals/Urinary Tract: Adrenal glands are normal. No hydronephrosis. Subcentimeter hypodensity mid left kidney too small to further characterize. Bladder normal Stomach/Bowel: Stomach is nonenlarged. No dilated small bowel. Large volume of stool in the colon. Moderate to large retained feces at the rectum. Vascular/Lymphatic: Moderate aortic atherosclerosis. No aneurysm. No significantly enlarged lymph nodes. Reproductive: Prostate is unremarkable. Other: Negative for free air or free fluid.  Mild presacral edema. Musculoskeletal: Asymmetric skin thickening and subcutaneous edema over the left gluteal region. Asymmetric enlargement of the left gluteal muscles. Gas within the midline and left paramedian soft tissues overlying  the sacrococcygeal region and left gluteal region. Scoliosis of the spine with degenerative changes. Chronic severe compression fracture of L3. IMPRESSION: 1. Motion degraded study which limits evaluation. 2. Skin thickening and moderate subcutaneous edema overlying the midline and left gluteal region, suspect for cellulitis. Gas within the subcutaneous soft tissues overlying the sacrococcygeal region and left gluteal region, presumably corresponding to the history of wound. Possibility of gas-forming infection/necrotizing infection is raised. No gross underlying osseous destruction. Mild asymmetric enlargement of the left gluteal muscles could be secondary to muscle edema/possible myositis. 3. Large volume of stool in the colon with large retained feces at the rectum suggesting constipation Electronically Signed   By: Jasmine Pang M.D.   On: 04/02/2018 02:09    EKG: Independently reviewed.  Sinus rhythm, poor quality EKG with artifact in multiple leads making interpretation difficult.  Repeat EKG has not been done yet.  Assessment/Plan Principal Problem:   Severe sepsis (HCC) Active Problems:   Sacral wound   Abdominal tenderness   Chronic anemia   Severe sepsis secondary to infected sacral wound -Noted to have a large extremely foul-smelling, necrotic sacral wound. -Temperature 101.7 F.  Blood pressure 88/51, improved with IV fluid resuscitation. -CT abdomen pelvis showing skin thickening and moderate subcutaneous edema overlying the midline and left gluteal region, suspicious for cellulitis.  There is gas within the subcutaneous soft tissues overlying the sacrococcygeal region and left gluteal region.  Possibility of gas-forming infection/necrotizing infection is raised.  No gross underlying osseous destruction.  Mild asymmetric enlargement of the left gluteal muscles thought to be secondary to muscle edema/possible myositis. -White count 21 in the ED.  Lactic acid 2.2 >2.3.  -Broad-spectrum antibiotics (vancomycin and Zosyn).  White count now improved to 17.8. -IV fluid resuscitation -Continue to monitor CBC -Continue to trend lactate -Tylenol PRN -Morphine 1 mg every 3 hours prn pain -UA -Blood culture x2 -Wound care consult -I spoke to Dr. Cliffton Asters, general surgery will see the patient. Appreciate recs.   Abdominal tenderness Patient noted to have generalized abdominal tenderness and guarding on exam.  AST mildly elevated at 44, remainder of LFTs normal.  CT abdomen pelvis showing large volume of stool in the colon with large retained feces at the rectum. -Water enema  Chronic anemia -Hemoglobin 11.3 in the ED, was 11.9 a month ago.  Repeat hemoglobin 9.7. Possibly hemodilutional effect after receiving IVF.  No signs of active bleeding. -Continue to monitor CBC  Unable to safely order home  medications at this time as pharmacy medication reconciliation has not been done.  DVT prophylaxis: SCDs Code Status: DNR.  Discussed with wife and son at bedside. Family Communication: Wife and son at bedside. Disposition Plan: Anticipate discharge after clinical improvement. Consults called: General surgery Admission status: It is my clinical opinion that admission to INPATIENT is reasonable and necessary in this 78 y.o. male . presenting with symptoms of fever, concerning for infected sacral wound . with pertinent positives on physical exam including: Foul-smelling, necrotic sacral wound . and pertinent positives on radiographic and laboratory data including: Evidence of infected sacral wound.  Leukocytosis. . Workup and treatment include broad-spectrum antibiotics, IV fluid resuscitation, surgical debridement.  Given the aforementioned, the predictability of an adverse outcome is felt to be significant. I expect that the patient will require at least 2 midnights in the hospital to treat this condition.    John Giovanni MD [facility] Pager [phone]  If 7PM-7AM, please contact night-coverage [email or web address] Password [email or web address]  04/02/2018, 4:40 AM

## 2018-04-02 NOTE — Transfer of Care (Signed)
Immediate Anesthesia Transfer of Care Note  Patient: Mark Mathews  Procedure(s) Performed: INCISION AND DRAINAGE debridement of sacral decubitis wound (N/A Back)  Patient Location: PACU  Anesthesia Type:General  Level of Consciousness: drowsy and patient cooperative  Airway & Oxygen Therapy: Patient Spontanous Breathing  Post-op Assessment: Report given to RN and Post -op Vital signs reviewed and stable  Post vital signs: Reviewed and stable  Last Vitals:  Vitals Value Taken Time  BP    Temp    Pulse    Resp    SpO2      Last Pain:  Vitals:   04/02/18 0431  TempSrc: Axillary         Complications: No apparent anesthesia complications

## 2018-04-03 ENCOUNTER — Encounter (HOSPITAL_COMMUNITY): Payer: Self-pay | Admitting: Surgery

## 2018-04-03 DIAGNOSIS — D72829 Elevated white blood cell count, unspecified: Secondary | ICD-10-CM

## 2018-04-03 DIAGNOSIS — D649 Anemia, unspecified: Secondary | ICD-10-CM

## 2018-04-03 DIAGNOSIS — F0281 Dementia in other diseases classified elsewhere with behavioral disturbance: Secondary | ICD-10-CM

## 2018-04-03 LAB — CBC WITH DIFFERENTIAL/PLATELET
Abs Immature Granulocytes: 0.12 10*3/uL — ABNORMAL HIGH (ref 0.00–0.07)
BASOS ABS: 0 10*3/uL (ref 0.0–0.1)
Basophils Relative: 0 %
Eosinophils Absolute: 0.2 10*3/uL (ref 0.0–0.5)
Eosinophils Relative: 2 %
HCT: 22.5 % — ABNORMAL LOW (ref 39.0–52.0)
Hemoglobin: 7.6 g/dL — ABNORMAL LOW (ref 13.0–17.0)
Immature Granulocytes: 1 %
Lymphocytes Relative: 12 %
Lymphs Abs: 1.3 10*3/uL (ref 0.7–4.0)
MCH: 32.5 pg (ref 26.0–34.0)
MCHC: 33.8 g/dL (ref 30.0–36.0)
MCV: 96.2 fL (ref 80.0–100.0)
Monocytes Absolute: 0.4 10*3/uL (ref 0.1–1.0)
Monocytes Relative: 3 %
Neutro Abs: 9 10*3/uL — ABNORMAL HIGH (ref 1.7–7.7)
Neutrophils Relative %: 82 %
Platelets: 234 10*3/uL (ref 150–400)
RBC: 2.34 MIL/uL — ABNORMAL LOW (ref 4.22–5.81)
RDW: 12.6 % (ref 11.5–15.5)
WBC: 11 10*3/uL — ABNORMAL HIGH (ref 4.0–10.5)
nRBC: 0 % (ref 0.0–0.2)

## 2018-04-03 LAB — BASIC METABOLIC PANEL
Anion gap: 8 (ref 5–15)
BUN: 15 mg/dL (ref 8–23)
CO2: 22 mmol/L (ref 22–32)
Calcium: 7.4 mg/dL — ABNORMAL LOW (ref 8.9–10.3)
Chloride: 113 mmol/L — ABNORMAL HIGH (ref 98–111)
Creatinine, Ser: 0.94 mg/dL (ref 0.61–1.24)
GFR calc Af Amer: >60 mL/min (ref >60)
GFR calc non Af Amer: >60 mL/min (ref >60)
Glucose, Bld: 111 mg/dL — ABNORMAL HIGH (ref 70–99)
Potassium: 2.7 mmol/L — CL (ref 3.5–5.1)
Sodium: 143 mmol/L (ref 135–145)

## 2018-04-03 LAB — MAGNESIUM: Magnesium: 2 mg/dL (ref 1.7–2.4)

## 2018-04-03 MED ORDER — SODIUM CHLORIDE 0.9 % IV BOLUS
500.0000 mL | Freq: Once | INTRAVENOUS | Status: AC
  Administered 2018-04-03: 500 mL via INTRAVENOUS

## 2018-04-03 MED ORDER — POTASSIUM CHLORIDE CRYS ER 20 MEQ PO TBCR
40.0000 meq | EXTENDED_RELEASE_TABLET | ORAL | Status: AC
  Administered 2018-04-03 (×2): 40 meq via ORAL
  Filled 2018-04-03 (×2): qty 2

## 2018-04-03 MED ORDER — OXYCODONE HCL 5 MG PO TABS
5.0000 mg | ORAL_TABLET | ORAL | Status: DC | PRN
  Administered 2018-04-05 – 2018-04-09 (×5): 5 mg via ORAL
  Filled 2018-04-03 (×5): qty 1

## 2018-04-03 NOTE — Progress Notes (Signed)
Patient ID: Mark Mathews, male   DOB: August 23, 1940, 78 y.o.   MRN: [id number]  PROGRESS NOTE    Mark Mathews  [id number] DOB: August 23, 1940 DOA: 04/01/2018 PCP: Maurice SmallGriffin, Elaine, MD   Brief Narrative:  78 year old male with history of Parkinson's disease, depression presented with sacral wound.  He was admitted with infected sacral wound and started on broad-spectrum antibiotics.  General surgery was consulted.  He underwent debridement on 04/02/2018 by general surgery.   Assessment & Plan:   Principal Problem:   Severe sepsis (HCC) Active Problems:   Sacral wound   Abdominal tenderness   Chronic anemia   Goals of care, counseling/discussion   Palliative care encounter   Severe sepsis secondary to infected sacral wound -Continue broad-spectrum antibiotics.  Follow cultures.  Blood pressures on the lower side but stable.  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  Continue broad-spectrum antibiotics for now.  We will follow-up with general surgery regarding duration of antibiotic treatment.  Wound care as per general surgery recommendations  Leukocytosis -From above.  Improving.  Monitor  Hypokalemia -Replace.  Repeat a.m. labs  Chronic anemia -No signs of bleeding.  Monitor.  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  He was currently bedbound at home for 3 to 4 weeks with some behavioral disturbances. -Continue carbidopa-levodopa, entacapone and trihexyphenidyl. -PT eval.  Might need SNF -Monitor mental status.  Fall precautions.  Palliative care following.  Overall prognosis is guarded to poor  DVT prophylaxis: SCDs.  Will start Lovenox Code Status: DNR Family Communication: None at bedside  disposition Plan: Depends on clinical outcome  Consultants:  General surgery/palliative care  Procedures: Sacral wound debridement on 04/02/2018 by general surgery  Antimicrobials: Vancomycin and Zosyn from 04/01/2018 onwards   Subjective: Patient seen and examined at bedside.  He is sleepy, hardly wakes up and continues him.  No overnight fever or vomiting reported by nursing staff.  Objective: Vitals:   04/03/18 0457 04/03/18 0526 04/03/18 0904 04/03/18 0922  BP: (!) 90/35 (!) 86/41 (!) 95/47 (!) 101/56  Pulse: 64 (!) 58    Resp: 20     Temp: 100.3 F (37.9 C)     TempSrc: Oral     SpO2: 96%     Weight:      Height:        Intake/Output Summary (Last 24 hours) at 04/03/2018 1143 Last data filed at 04/03/2018 0630 Gross per 24 hour  Intake 698.47 ml  Output 180 ml  Net 518.47 ml   Filed Weights   04/01/18 2132 04/02/18 0229  Weight: 70.3 kg 63.9 kg    Examination:  General exam: Elderly male, chronically ill looking.  Lying in bed.  Sleepy, hardly wakes up.  No distress Respiratory system: Bilateral decreased breath sounds at bases, no wheezing Cardiovascular system: S1 & S2 heard, intermittent bradycardia  gastrointestinal system: Abdomen is nondistended, soft and nontender. Normal bowel sounds heard. Extremities: No cyanosis, clubbing, edema     Data Reviewed: I have personally reviewed following labs and imaging studies  CBC: Recent Labs  Lab 04/01/18 2132 04/02/18 0345 04/03/18 0900  WBC 21.0* 17.8* 11.0*  NEUTROABS 18.3*  --  9.0*  HGB 11.3* 9.7* 7.6*  HCT 35.9* 29.3* 22.5*  MCV 99.7 95.1 96.2  PLT 315 283 234   Basic Metabolic Panel: Recent  Labs  Lab 04/01/18 2132 04/03/18 0900  NA 139 143  K 3.5 2.7*  CL 101 113*  CO2 23 22  GLUCOSE 113* 111*  BUN 23 15  CREATININE 0.93 0.94  CALCIUM 8.5* 7.4*  MG  --  2.0   GFR: Estimated Creatinine Clearance: 58.4 mL/min (by C-G formula based on SCr of 0.94 mg/dL). Liver Function Tests: Recent Labs  Lab 04/01/18 2132  AST 44*  ALT 9  ALKPHOS 53  BILITOT  0.8  PROT 6.6  ALBUMIN 2.4*   No results for input(s): LIPASE, AMYLASE in the last 168 hours. No results for input(s): AMMONIA in the last 168 hours. Coagulation Profile: No results for input(s): INR, PROTIME in the last 168 hours. Cardiac Enzymes: No results for input(s): CKTOTAL, CKMB, CKMBINDEX, TROPONINI in the last 168 hours. BNP (last 3 results) No results for input(s): PROBNP in the last 8760 hours. HbA1C: No results for input(s): HGBA1C in the last 72 hours. CBG: No results for input(s): GLUCAP in the last 168 hours. Lipid Profile: No results for input(s): CHOL, HDL, LDLCALC, TRIG, CHOLHDL, LDLDIRECT in the last 72 hours. Thyroid Function Tests: No results for input(s): TSH, T4TOTAL, FREET4, T3FREE, THYROIDAB in the last 72 hours. Anemia Panel: No results for input(s): VITAMINB12, FOLATE, FERRITIN, TIBC, IRON, RETICCTPCT in the last 72 hours. Sepsis Labs: Recent Labs  Lab 04/01/18 2218 04/01/18 2338 04/02/18 0335  LATICACIDVEN 2.2* 2.3* 1.3    Recent Results (from the past 240 hour(s))  Blood Culture (routine x 2)     Status: None (Preliminary result)   Collection Time: 04/01/18  9:35 PM  Result Value Ref Range Status   Specimen Description SITE NOT SPECIFIED  Final   Special Requests   Final    BOTTLES DRAWN AEROBIC AND ANAEROBIC Blood Culture adequate volume   Culture   Final    NO GROWTH 2 DAYS Performed at [facility], [address] [place], [place] [address]    Report Status PENDING  Incomplete  Blood Culture (routine x 2)     Status: None (Preliminary result)   Collection Time: 04/01/18 10:16 PM  Result Value Ref Range Status   Specimen Description BLOOD LEFT HAND  Final   Special Requests   Final    BOTTLES DRAWN AEROBIC AND ANAEROBIC Blood Culture adequate volume   Culture   Final    NO GROWTH 2 DAYS Performed at [facility], [address] [place], [place] [address]    Report Status PENDING  Incomplete  MRSA PCR Screening      Status: None   Collection Time: 04/02/18  4:31 AM  Result Value Ref Range Status   MRSA by PCR NEGATIVE NEGATIVE Final    Comment:        The GeneXpert MRSA Assay (FDA approved for NASAL specimens only), is one component of a comprehensive MRSA colonization surveillance program. It is not intended to diagnose MRSA infection nor to guide or monitor treatment for MRSA infections. Performed at [facility], [address] [place], [place] [address]          Radiology Studies: Ct Abdomen Pelvis W Contrast  Result Date: 04/02/2018 CLINICAL DATA:  Sepsis EXAM: CT ABDOMEN AND PELVIS WITH CONTRAST TECHNIQUE: Multidetector CT imaging of the abdomen and pelvis was performed using the standard protocol following bolus administration of intravenous contrast. CONTRAST:  100mL OMNIPAQUE IOHEXOL 300 MG/ML  SOLN COMPARISON:  08/12/2017 FINDINGS: Lower chest: Lung bases demonstrate no acute  consolidation or effusion. Heart size is normal. Motion degraded study Hepatobiliary: No focal liver abnormality is seen. No gallstones, gallbladder wall thickening, or biliary dilatation. Pancreas: Unremarkable. No pancreatic ductal dilatation or surrounding inflammatory changes. Spleen: Normal in size without focal abnormality. Adrenals/Urinary Tract: Adrenal glands are normal. No hydronephrosis. Subcentimeter hypodensity mid left kidney too small to further characterize. Bladder normal Stomach/Bowel: Stomach is nonenlarged. No dilated small bowel. Large volume of stool in the colon. Moderate to large retained feces at the rectum. Vascular/Lymphatic: Moderate aortic atherosclerosis. No aneurysm. No significantly enlarged lymph nodes. Reproductive: Prostate is unremarkable. Other: Negative for free air or free fluid.  Mild presacral edema. Musculoskeletal: Asymmetric skin thickening and subcutaneous edema over the left gluteal region. Asymmetric enlargement of the left gluteal muscles. Gas within the midline and  left paramedian soft tissues overlying the sacrococcygeal region and left gluteal region. Scoliosis of the spine with degenerative changes. Chronic severe compression fracture of L3. IMPRESSION: 1. Motion degraded study which limits evaluation. 2. Skin thickening and moderate subcutaneous edema overlying the midline and left gluteal region, suspect for cellulitis. Gas within the subcutaneous soft tissues overlying the sacrococcygeal region and left gluteal region, presumably corresponding to the history of wound. Possibility of gas-forming infection/necrotizing infection is raised. No gross underlying osseous destruction. Mild asymmetric enlargement of the left gluteal muscles could be secondary to muscle edema/possible myositis. 3. Large volume of stool in the colon with large retained feces at the rectum suggesting constipation Electronically Signed   By: Jasmine Pang M.D.   On: 04/02/2018 02:09        Scheduled Meds: . acetaminophen  1,000 mg Oral TID  . carbidopa-levodopa  1.5 tablet Oral 5 X Daily  . entacapone  200 mg Oral 5 X Daily  . potassium chloride  40 mEq Oral Q4H  . trihexyphenidyl  1 mg Oral TID WC   Continuous Infusions: . piperacillin-tazobactam (ZOSYN)  IV 3.375 g (04/03/18 0419)  . vancomycin 1,500 mg (04/02/18 2041)     LOS: 1 day        Glade Lloyd, MD [facility] Pager [phone]  If 7PM-7AM, please contact night-coverage [email or web address] Password [facility] 04/03/2018, 11:43 AM

## 2018-04-03 NOTE — Progress Notes (Signed)
CRITICAL VALUE ALERT  Critical Value:  k 2.7  Date & Time Notied:  04/03/2018 1003  Provider Notified: DR. Hanley Ben   Orders Received/Actions taken: Orders placed by Dr. Hanley Ben

## 2018-04-03 NOTE — Progress Notes (Signed)
[facility]/Trauma Progress Note  1 Day Post-Op   Assessment/Plan  Principal Problem:   Severe sepsis (HCC) Active Problems:   Sacral wound   Abdominal tenderness   Chronic anemia   Goals of care, counseling/discussion   Palliative care encounter  Necrotic sacral decub - S/P Excision of sacral decubitus ulcer with debdridement, Dr. Cliffton Asters, 01/30 - wet to dry dressing changes q shift or more often as needed if soiled with stool  FEN: reg diet VTE: SCD's, ok for lovenox from a surgical perspective but will defer to medicine ID: Vanc & Zosyn 01/29>> Follow up: TBD    LOS: 1 day    Subjective: CC: sacral wound  No issues overnight or complaints of pain.   Objective: Vital signs in last 24 hours: Temp:  [98.8 F (37.1 C)-100.8 F (38.2 C)] 100.3 F (37.9 C) (01/31 0457) Pulse Rate:  [58-80] 58 (01/31 0526) Resp:  [14-20] 20 (01/31 0457) BP: (86-102)/(35-56) 101/56 (01/31 0922) SpO2:  [96 %-100 %] 96 % (01/31 0457) Last BM Date: (unsure)  Intake/Output from previous day: 01/30 0701 - 01/31 0700 In: 698.5 [IV Piggyback:698.5] Out: 180 [Urine:180] Intake/Output this shift: No intake/output data recorded.  PE: Gen:  Alert, NAD GU: sacral wound looks clean, no purulent drainage, see photo below Skin: warm and dry      Anti-infectives: Anti-infectives (From admission, onward)   Start     Dose/Rate Route Frequency Ordered Stop   04/02/18 2000  vancomycin (VANCOCIN) 1,500 mg in sodium chloride 0.9 % 500 mL IVPB     1,500 mg 250 mL/hr over 120 Minutes Intravenous Every 24 hours 04/02/18 0029     04/02/18 0400  piperacillin-tazobactam (ZOSYN) IVPB 3.375 g     3.375 g 12.5 mL/hr over 240 Minutes Intravenous Every 8 hours 04/02/18 0029     04/01/18 2230  vancomycin (VANCOCIN) 1,500 mg in sodium chloride 0.9 % 500 mL IVPB     1,500 mg 250 mL/hr over 120 Minutes Intravenous  Once 04/01/18 2142 04/02/18 0039   04/01/18 2200  piperacillin-tazobactam  (ZOSYN) IVPB 3.375 g     3.375 g 100 mL/hr over 30 Minutes Intravenous  Once 04/01/18 2142 04/01/18 2323      Lab Results:  Recent Labs    04/01/18 2132 04/02/18 0345  WBC 21.0* 17.8*  HGB 11.3* 9.7*  HCT 35.9* 29.3*  PLT 315 283   BMET Recent Labs    04/01/18 2132  NA 139  K 3.5  CL 101  CO2 23  GLUCOSE 113*  BUN 23  CREATININE 0.93  CALCIUM 8.5*   PT/INR No results for input(s): LABPROT, INR in the last 72 hours. CMP     Component Value Date/Time   NA 139 04/01/2018 2132   K 3.5 04/01/2018 2132   CL 101 04/01/2018 2132   CO2 23 04/01/2018 2132   GLUCOSE 113 (H) 04/01/2018 2132   BUN 23 04/01/2018 2132   CREATININE 0.93 04/01/2018 2132   CALCIUM 8.5 (L) 04/01/2018 2132   PROT 6.6 04/01/2018 2132   ALBUMIN 2.4 (L) 04/01/2018 2132   AST 44 (H) 04/01/2018 2132   ALT 9 04/01/2018 2132   ALKPHOS 53 04/01/2018 2132   BILITOT 0.8 04/01/2018 2132   GFRNONAA >60 04/01/2018 2132   GFRAA >60 04/01/2018 2132   Lipase  No results found for: LIPASE  Studies/Results: Ct Abdomen Pelvis W Contrast  Result Date: 04/02/2018 CLINICAL DATA:  Sepsis EXAM: CT ABDOMEN AND PELVIS WITH CONTRAST TECHNIQUE: Multidetector CT imaging  of the abdomen and pelvis was performed using the standard protocol following bolus administration of intravenous contrast. CONTRAST:  OMNIPAQUE IOHEXOL 300 MG/ML  SOLN COMPARISON:  08/12/2017 FINDINGS: Lower chest: Lung bases demonstrate no acute consolidation or effusion. Heart size is normal. Motion degraded study Hepatobiliary: No focal liver abnormality is seen. No gallstones, gallbladder wall thickening, or biliary dilatation. Pancreas: Unremarkable. No pancreatic ductal dilatation or surrounding inflammatory changes. Spleen: Normal in size without focal abnormality. Adrenals/Urinary Tract: Adrenal glands are normal. No hydronephrosis. Subcentimeter hypodensity mid left kidney too small to further characterize. Bladder normal Stomach/Bowel:  Stomach is nonenlarged. No dilated small bowel. Large volume of stool in the colon. Moderate to large retained feces at the rectum. Vascular/Lymphatic: Moderate aortic atherosclerosis. No aneurysm. No significantly enlarged lymph nodes. Reproductive: Prostate is unremarkable. Other: Negative for free air or free fluid.  Mild presacral edema. Musculoskeletal: Asymmetric skin thickening and subcutaneous edema over the left gluteal region. Asymmetric enlargement of the left gluteal muscles. Gas within the midline and left paramedian soft tissues overlying the sacrococcygeal region and left gluteal region. Scoliosis of the spine with degenerative changes. Chronic severe compression fracture of L3. IMPRESSION: 1. Motion degraded study which limits evaluation. 2. Skin thickening and moderate subcutaneous edema overlying the midline and left gluteal region, suspect for cellulitis. Gas within the subcutaneous soft tissues overlying the sacrococcygeal region and left gluteal region, presumably corresponding to the history of wound. Possibility of gas-forming infection/necrotizing infection is raised. No gross underlying osseous destruction. Mild asymmetric enlargement of the left gluteal muscles could be secondary to muscle edema/possible myositis. 3. Large volume of stool in the colon with large retained feces at the rectum suggesting constipation Electronically Signed   By: Jasmine Pang M.D.   On: 04/02/2018 02:09      Jerre Simon , [facility] Surgery 04/03/2018, 9:27 AM  Pager: [phone] Mon-Wed, Friday 7:00am-4:30pm Thurs 7am-11:30am  Consults: [phone]

## 2018-04-03 NOTE — Progress Notes (Signed)
RN informed by phlebotomy of patient's refusal of labs. RN requested phlebotomist to attempt again x 1 and RN will discuss need with patient.

## 2018-04-03 NOTE — Evaluation (Cosign Needed)
Clinical/Bedside Swallow Evaluation Patient Details  Name: Mark Mathews MRN: [id number] Date of Birth: 08-05-1940  Today's Date: 04/03/2018 Time: SLP Start Time (ACUTE ONLY): 0130 SLP Stop Time (ACUTE ONLY): 0215 SLP Time Calculation (min) (ACUTE ONLY): 45 min  Past Medical History:  Past Medical History:  Diagnosis Date  . Other and unspecified hyperlipidemia   . Paralysis agitans (HCC)   . Parkinson's disease (HCC) 10/12/2012  . Spinal stenosis    Past Surgical History:  Past Surgical History:  Procedure Laterality Date  . APPENDECTOMY    . CATARACT EXTRACTION  2016   x3  . INGUINAL HERNIA REPAIR Bilateral 03/06/2009  . KIDNEY SURGERY     HPI:  Pt is a 78 y.o. male with history significant of advanced Parkinson's disease, spinal stenosis brought to hospital with concern for sacral wound.  Wife states pt has had a sacral wound for 10 days which has been getting progressively worse.  States pt bedridden at baseline and has been increasingly more lethargic (sleeping all day).  Family has not noticed any change in mental status from baseline. Principle problem is severe sepsis secondary to infected wound. Intubated and extubated 1/30. Progression of Parkinson's over past ~1-1.5 month has been c/b bedboundness, increased agitation, spiting out medications, difficulty with eating/drinking at times, few moment of meaningful interactions, awake all night moaning, sleeping in daytime. Wife confirms DNR and would not want artificial feeding.   Assessment / Plan / Recommendation Clinical Impression  Pt, presenting with advanced Parkinson's disease, is reported to have some difficulty with eating and drinking and has been spitting out medications. Wife reports that he has been orally holding and expectorating pills. She also reports decline in mentation, but speech  fairly unaffected. Pt arousal and response improved after oral care. Pt tolerated ice chips, thin liquids (water, ensure), and  regular solid textured POs with no overt s/sx of aspiration, except for a few coughs after a sip of thin liquid via cup. Intake of thin liquids via straw tolerated much better and pt able to take multiple sips at a time without stopping or showing signs of aspiration. Advised wife to be sure pt fully alert before PO intake. She was also educated on assisting pt by following bites with sips of thin liquid to aid in oral transit. Recommend continuation of regular, thin liquid diet. No f/u from SLP warranted.  SLP Visit Diagnosis: Dysphagia, unspecified (R13.10)    Aspiration Risk       Diet Recommendation Regular;Thin liquid   Liquid Administration via: Cup;Straw(Better control of bolus with straw) Medication Administration: Crushed with puree Supervision: Staff to assist with self feeding;Patient able to self feed Compensations: Follow solids with liquid Postural Changes: Seated upright at 90 degrees;Remain upright for at least 30 minutes after po intake    Other  Recommendations Oral Care Recommendations: Oral care BID   Follow up Recommendations        Frequency and Duration            Prognosis        Swallow Study   General HPI: Pt is a 78 y.o. male with history significant of advanced Parkinson's disease, spinal stenosis brought to hospital with concern for sacral wound.  Wife states pt has had a sacral wound for 10 days which has been getting progressively worse.  States pt bedridden at baseline and has been increasingly more lethargic (sleeping all day).  Family has not noticed any change in mental status from baseline. Principle problem is  severe sepsis secondary to infected wound. Intubated and extubated 1/30. Progression of Parkinson's over past ~1-1.5 month has been c/b bedboundness, increased agitation, spiting out medications, difficulty with eating/drinking at times, few moment of meaningful interactions, awake all night moaning, sleeping in daytime. Wife confirms DNR and  would not want artificial feeding. Type of Study: Bedside Swallow Evaluation Diet Prior to this Study: Regular;Thin liquids Temperature Spikes Noted: Yes Respiratory Status: Room air History of Recent Intubation: Yes Length of Intubations (days): 1 days Date extubated: 04/02/18 Behavior/Cognition: Cooperative;Pleasant mood;Confused;Lethargic/Drowsy(Increasingly alert as session went on.) Oral Cavity Assessment: Within Functional Limits Oral Care Completed by SLP: Yes Oral Cavity - Dentition: Adequate natural dentition Vision: Functional for self-feeding Self-Feeding Abilities: Able to feed self;Needs assist;Needs set up Patient Positioning: Upright in bed Baseline Vocal Quality: Normal Volitional Cough: Other (Comment)(NT) Volitional Swallow: (NT)    Oral/Motor/Sensory Function Overall Oral Motor/Sensory Function: Within functional limits(Better with nonvolitional tasks)   Ice Chips Ice chips: Within functional limits Presentation: Spoon   Thin Liquid Thin Liquid: Within functional limits Presentation: Cup;Straw;Self Fed    Nectar Thick Nectar Thick Liquid: Not tested   Honey Thick Honey Thick Liquid: Not tested   Puree Puree: Not tested   Solid     Solid: Within functional limits Presentation: (by hand of SLP)      Gardiner Ramus, SLP Student 04/03/2018,2:44 PM

## 2018-04-03 NOTE — Consult Note (Signed)
WOC Nurse wound consult note Reason for Consult:Stage 4 pressure injury to sacrum  OR debridement 04/02/18.  PA at bedside for assessment and wound care as well.  Wound type: Stage 4 pressure injury Pressure Injury POA: Yes Measurement: 15 cm x 11 cm x 6 cm at deepest point.  Depth changes based on position.  Wound bed: scattered black cauterized tissue with visible tendon and palpable bone.  Drainage (amount, consistency, odor) moderate serosanguinous Periwound: moderate soft stool Dressing procedure/placement/frequency: NS moist kerlix twice daily per MD orders.  WOC team will not follow.    Please re-consult if needed.  Maple Hudson MSN, RN, FNP-BC CWON Wound, Ostomy, Continence Nurse Pager [phone]

## 2018-04-03 NOTE — Evaluation (Signed)
Physical Therapy Evaluation Patient Details Name: Mark Mathews MRN: [id number] DOB: 01/30/1941 Today's Date: 04/03/2018   History of Present Illness  Mark Mathews is a 78 y.o. male with medical history significant of Parkinson's disease, depression being brought into the hospital by family due to concern for a sacral wound.  Wife at bedside states patient has had a sacral wound for the past 10 days which has been getting progressively worse.  States patient is bedridden at baseline.  He has been more lethargic recently and sleeping all day  Clinical Impression  Pt admitted with/for decline in mobility and worsening sacral wound.  Pt is presently dependent in care, mildly contractured and basically bedbound..  Pt currently limited functionally due to the problems listed. ( See problems list.)   Pt will benefit from PT to maximize function and safety in order to get ready for next venue listed below.     Follow Up Recommendations SNF;Supervision/Assistance - 24 hour    Equipment Recommendations  Other (comment)(TBA may need palliative or hospice services)    Recommendations for Other Services       Precautions / Restrictions Precautions Precautions: Fall Precaution Comments: contracture Restrictions Weight Bearing Restrictions: No      Mobility  Bed Mobility Overal bed mobility: Needs Assistance Bed Mobility: Rolling;Sidelying to Sit;Sit to Supine Rolling: Total assist Sidelying to sit: Total assist;+2 for physical assistance   Sit to supine: Total assist;+2 for physical assistance   General bed mobility comments: pt not actively assist initially  Transfers                    Ambulation/Gait                Stairs            Wheelchair Mobility    Modified Rankin (Stroke Patients Only)       Balance Overall balance assessment: Needs assistance Sitting-balance support: Single extremity supported;Bilateral upper extremity supported Sitting  balance-Leahy Scale: Poor Sitting balance - Comments: moderate list left and need of UE's to sit with min guard for 10-15 seconds.                                     Pertinent Vitals/Pain Pain Assessment: Faces Faces Pain Scale: No hurt Pain Intervention(s): Repositioned    Home Living Family/patient expects to be discharged to:: Skilled nursing facility                      Prior Function Level of Independence: Needs assistance         Comments: pt has slept in a recliner for 6 mon, assisted by son to bathe, stand and walk as of three weeks ago.  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  ext lag of -25*   also aduction contracture worsening LLE Coordination: decreased fine motor;decreased gross motor       Communication   Communication: No difficulties;Other (comment)(soft spoken)  Cognition Arousal/Alertness: Lethargic;Awake/alert Behavior During Therapy: Flat  affect;WFL for tasks assessed/performed Overall Cognitive Status: Difficult to assess                                        General Comments General comments (skin integrity, edema, etc.): Spent the most time on contracture management, family ed and repositioning    Exercises     Assessment/Plan    PT Assessment Patient needs continued PT services  PT Problem List Decreased strength;Decreased range of motion;Decreased activity tolerance;Decreased mobility;Decreased coordination;Pain;Decreased skin integrity       PT  Treatment Interventions Functional mobility training;Therapeutic activities;Balance training;Patient/family education    PT Goals (Current goals can be found in the Care Plan section)  Acute Rehab PT Goals Patient Stated Goal: pt unable: wife wants to get rid of infection then move to next step in the rehab process. PT Goal Formulation: With patient/family Time For Goal Achievement: 04/17/18 Potential to Achieve Goals: Fair    Frequency Min 2X/week   Barriers to discharge        Co-evaluation               AM-PAC PT "6 Clicks" Mobility  Outcome Measure Help needed turning from your back to your side while in a flat bed without using bedrails?: Total Help needed moving from lying on your back to sitting on the side of a flat bed without using bedrails?: Total Help needed moving to and from a bed to a chair (including a wheelchair)?: Total Help needed standing up from a chair using your arms (e.g., wheelchair or bedside chair)?: Total Help needed to walk in hospital room?: Total Help needed climbing 3-5 steps with a railing? : Total 6 Click Score: 6    End of Session   Activity Tolerance: Patient tolerated treatment well Patient left: in bed;with call bell/phone within reach;with family/visitor present;with nursing/sitter in room Nurse Communication: Mobility status PT Visit Diagnosis: Other abnormalities of gait and mobility (R26.89);Adult, failure to thrive (R62.7);Pain Pain - part of body: (sacrum)    Time: 1022-1100 PT Time Calculation (min) (ACUTE ONLY): 38 min   Charges:   PT Evaluation $PT Eval High Complexity: 1 High PT Treatments $Self Care/Home Management: 23-37        04/03/2018  Baylor Bing, PT Acute Rehabilitation Services [phone]  (pager) [phone]  (office)  Eliseo Gum Radley Teston 04/03/2018, 11:23 AM

## 2018-04-03 NOTE — Progress Notes (Signed)
Patient's BP is 86/41 and HR 58 this morning. Notified on call physician [place]. Will continue to monitor.

## 2018-04-04 LAB — BASIC METABOLIC PANEL
Anion gap: 7 (ref 5–15)
BUN: 17 mg/dL (ref 8–23)
CO2: 22 mmol/L (ref 22–32)
Calcium: 7.6 mg/dL — ABNORMAL LOW (ref 8.9–10.3)
Chloride: 114 mmol/L — ABNORMAL HIGH (ref 98–111)
Creatinine, Ser: 0.99 mg/dL (ref 0.61–1.24)
GFR calc Af Amer: >60 mL/min (ref >60)
GFR calc non Af Amer: >60 mL/min (ref >60)
Glucose, Bld: 122 mg/dL — ABNORMAL HIGH (ref 70–99)
Potassium: 3.5 mmol/L (ref 3.5–5.1)
Sodium: 143 mmol/L (ref 135–145)

## 2018-04-04 LAB — BLOOD CULTURE ID PANEL (REFLEXED)

## 2018-04-04 LAB — CBC WITH DIFFERENTIAL/PLATELET
Abs Immature Granulocytes: 0.13 10*3/uL — ABNORMAL HIGH (ref 0.00–0.07)
BASOS PCT: 0 %
Basophils Absolute: 0 10*3/uL (ref 0.0–0.1)
Eosinophils Absolute: 0.4 10*3/uL (ref 0.0–0.5)
Eosinophils Relative: 4 %
HCT: 24 % — ABNORMAL LOW (ref 39.0–52.0)
Hemoglobin: 8.1 g/dL — ABNORMAL LOW (ref 13.0–17.0)
Immature Granulocytes: 1 %
Lymphocytes Relative: 15 %
Lymphs Abs: 1.6 10*3/uL (ref 0.7–4.0)
MCH: 32.4 pg (ref 26.0–34.0)
MCHC: 33.8 g/dL (ref 30.0–36.0)
MCV: 96 fL (ref 80.0–100.0)
Monocytes Absolute: 0.4 10*3/uL (ref 0.1–1.0)
Monocytes Relative: 4 %
NRBC: 0 % (ref 0.0–0.2)
Neutro Abs: 7.9 10*3/uL — ABNORMAL HIGH (ref 1.7–7.7)
Neutrophils Relative %: 76 %
Platelets: 270 10*3/uL (ref 150–400)
RBC: 2.5 MIL/uL — ABNORMAL LOW (ref 4.22–5.81)
RDW: 12.6 % (ref 11.5–15.5)
WBC: 10.5 10*3/uL (ref 4.0–10.5)

## 2018-04-04 LAB — VANCOMYCIN, RANDOM: Vancomycin Rm: 14

## 2018-04-04 LAB — MAGNESIUM: Magnesium: 2.1 mg/dL (ref 1.7–2.4)

## 2018-04-04 LAB — GLUCOSE, CAPILLARY: GLUCOSE-CAPILLARY: 118 mg/dL — AB (ref 70–99)

## 2018-04-04 LAB — VANCOMYCIN, PEAK: Vancomycin Pk: 35 ug/mL (ref 30–40)

## 2018-04-04 NOTE — Progress Notes (Signed)
Dressing change to sacrum completed at 1815. Foam sacral dressing applied.   Patient declining food and meds crushed/mixed with applesauce with very poor appetite. Patient's family (wife and son) inquired about patient having a feeding tube placed since patient has poor appetite and declines food. Informed family to follow-up with physician. Patient's family expressed concerns about possible constipation and encouraged to discuss concerns about patient's overall prognosis and plan of care with physician. Informed night nurse of family's concerns about appetite, possible constipation and inquiry about feeding tube.

## 2018-04-04 NOTE — Progress Notes (Signed)
PHARMACY - PHYSICIAN COMMUNICATION CRITICAL VALUE ALERT - BLOOD CULTURE IDENTIFICATION (BCID)  Mark Mathews is an 77 y.o. male who presented to [facility] on 04/01/2018 with a chief complaint of sepsis/sacral wound  Blood culture from 1/29 now with Gram Negative Rods, however, there was no organism detection on rapid culture identification.    Name of physician (or Provider) Contacted: Dr. Hanley Ben  Current antibiotics: Vancomycin/Zosyn  Changes to prescribed antibiotics recommended:  No changes for now, wait for speciation from regular culture set-up   [name] 04/04/2018  7:27 AM

## 2018-04-04 NOTE — Progress Notes (Signed)
Pharmacy Antibiotic Note  Mark Mathews is a 78 y.o. male admitted on 04/01/2018 with concern for sepsis resulting from developing sacral wound.  Pharmacy has been consulted for Vancocin and Zosyn dosing. WBC 10.5, tmax 99.3, LA down to 1.3.   Plan: Calculated AUC based on Vpeak 35 and Vrandom 14: 480.6  --Continue Vancomycin 1500 mg IV Q 24 hrs.  Goal AUC 400-550. Continue Zosyn 3.375g IV Q8H (4-hour infusion).  Height: 5\' 6"  (167.6 cm) Weight: 140 lb 14 oz (63.9 kg) IBW/kg (Calculated) : 63.8  Temp (24hrs), Avg:98.6 F (37 C), Min:97.9 F (36.6 C), Max:99.3 F (37.4 C)  Recent Labs  Lab 04/01/18 2132 04/01/18 2218 04/01/18 2338 04/02/18 0335 04/02/18 0345 04/03/18 0900 04/04/18 0051 04/04/18 0924  WBC 21.0*  --   --   --  17.8* 11.0* 10.5  --   CREATININE 0.93  --   --   --   --  0.94 0.99  --   LATICACIDVEN  --  2.2* 2.3* 1.3  --   --   --   --   VANCOPEAK  --   --   --   --   --   --  35  --   VANCORANDOM  --   --   --   --   --   --   --  14    Estimated Creatinine Clearance: 55.5 mL/min (by C-G formula based on SCr of 0.99 mg/dL).    No Known Allergies  Thank you for involving pharmacy in this patient's care.  Wendelyn Breslow, PharmD PGY1 Pharmacy Resident Phone: [phone] 04/04/2018 10:48 AM

## 2018-04-04 NOTE — Progress Notes (Signed)
[facility]/Trauma Progress Note  2 Days Post-Op   Assessment/Plan Principal Problem:   Severe sepsis (HCC) Active Problems:   Sacral wound   Abdominal tenderness   Chronic anemia   Goals of care, counseling/discussion   Palliative care encounter  Necrotic sacral decub - S/P Excision of sacral decubitus ulcer with debdridement, Dr. Cliffton Asters, 01/30 - wet to dry dressing changes q shift or more often as needed if soiled with stool  FEN: reg diet VTE: SCD's, ok for lovenox from a surgical perspective but will defer to medicine ID: Vanc & Zosyn 01/29>> Follow up: TBD   LOS: 2 days    Subjective: CC: sacral wound  Denies pain.   Objective: Vital signs in last 24 hours: Temp:  [97.9 F (36.6 C)-99 F (37.2 C)] 99 F (37.2 C) (02/01 0518) Pulse Rate:  [53-70] 67 (02/01 0609) Resp:  [20-22] 22 (02/01 0518) BP: (87-176)/(38-166) 125/56 (02/01 0609) SpO2:  [95 %-98 %] 95 % (02/01 0609) Last BM Date: (unsure)  Intake/Output from previous day: 01/31 0701 - 02/01 0700 In: 53.8 [IV Piggyback:53.8] Out: 200 [Urine:200] Intake/Output this shift: No intake/output data recorded.  PE: Gen:  Alert, NAD GU: sacral wound looks clean, no purulent drainage, see photo below Skin: warm and dry      Anti-infectives: Anti-infectives (From admission, onward)   Start     Dose/Rate Route Frequency Ordered Stop   04/02/18 2000  vancomycin (VANCOCIN) 1,500 mg in sodium chloride 0.9 % 500 mL IVPB     1,500 mg 250 mL/hr over 120 Minutes Intravenous Every 24 hours 04/02/18 0029     04/02/18 0400  piperacillin-tazobactam (ZOSYN) IVPB 3.375 g     3.375 g 12.5 mL/hr over 240 Minutes Intravenous Every 8 hours 04/02/18 0029     04/01/18 2230  vancomycin (VANCOCIN) 1,500 mg in sodium chloride 0.9 % 500 mL IVPB     1,500 mg 250 mL/hr over 120 Minutes Intravenous  Once 04/01/18 2142 04/02/18 0039   04/01/18 2200  piperacillin-tazobactam (ZOSYN) IVPB 3.375 g     3.375 g 100  mL/hr over 30 Minutes Intravenous  Once 04/01/18 2142 04/01/18 2323      Lab Results:  Recent Labs    04/03/18 0900 04/04/18 0051  WBC 11.0* 10.5  HGB 7.6* 8.1*  HCT 22.5* 24.0*  PLT 234 270   BMET Recent Labs    04/03/18 0900 04/04/18 0051  NA 143 143  K 2.7* 3.5  CL 113* 114*  CO2 22 22  GLUCOSE 111* 122*  BUN 15 17  CREATININE 0.94 0.99  CALCIUM 7.4* 7.6*   PT/INR No results for input(s): LABPROT, INR in the last 72 hours. CMP     Component Value Date/Time   NA 143 04/04/2018 0051   K 3.5 04/04/2018 0051   CL 114 (H) 04/04/2018 0051   CO2 22 04/04/2018 0051   GLUCOSE 122 (H) 04/04/2018 0051   BUN 17 04/04/2018 0051   CREATININE 0.99 04/04/2018 0051   CALCIUM 7.6 (L) 04/04/2018 0051   PROT 6.6 04/01/2018 2132   ALBUMIN 2.4 (L) 04/01/2018 2132   AST 44 (H) 04/01/2018 2132   ALT 9 04/01/2018 2132   ALKPHOS 53 04/01/2018 2132   BILITOT 0.8 04/01/2018 2132   GFRNONAA >60 04/04/2018 0051   GFRAA >60 04/04/2018 0051   Lipase  No results found for: LIPASE  Studies/Results: No results found.    Jerre Simon , [facility] Surgery 04/04/2018, 8:52 AM  Pager: [phone]  Mon-Wed, Friday 7:00am-4:30pm Thurs 7am-11:30am  Consults: [phone]

## 2018-04-04 NOTE — Progress Notes (Signed)
Patient ID: Mark Mathews, male   DOB: 12-Jul-1940, 78 y.o.   MRN: [id number]  PROGRESS NOTE    REVERE WHEELING  [id number] DOB: 1940/09/26 DOA: 04/01/2018 PCP: Maurice Small, MD   Brief Narrative:  78 year old male with history of Parkinson's disease, depression presented with sacral wound.  He was admitted with infected sacral wound and started on broad-spectrum antibiotics.  General surgery was consulted.  He underwent debridement on 04/02/2018 by general surgery.   Assessment & Plan:   Principal Problem:   Severe sepsis (HCC) Active Problems:   Sacral wound   Abdominal tenderness   Chronic anemia   Goals of care, counseling/discussion   Palliative care encounter   Severe sepsis secondary to infected sacral wound -Continue broad-spectrum antibiotics.  Follow cultures.  Blood pressure improving.  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  Continue broad-spectrum antibiotics for now.  We will follow-up with general surgery regarding duration of antibiotic treatment.  Wound care as per general surgery recommendations  Gram-negative rod bacteremia -Probably from above.  Follow identification and sensitivities.  Continue current antibiotics  Leukocytosis -From above.  Resolved.  Hypokalemia -Improved.  Chronic anemia -No signs of bleeding.  Monitor.  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  He was currently bedbound at home for 3 to 4 weeks with some behavioral disturbances. -Continue carbidopa-levodopa, entacapone and trihexyphenidyl. -PT recommends SNF placement.  Consult social worker -Monitor mental status.  Fall precautions.  Palliative care following.  Overall prognosis is guarded to poor.  If condition does not improve, consider hospice/comfort  measures  DVT prophylaxis: SCDs.  Will start Lovenox Code Status: DNR Family Communication: None at bedside  disposition Plan: Depends on clinical outcome  Consultants: General surgery/palliative care  Procedures: Sacral wound debridement on 04/02/2018 by general surgery  Antimicrobials: Vancomycin and Zosyn from 04/01/2018 onwards   Subjective: Patient seen and examined at bedside.  No overnight fever or vomiting reported by nursing staff.  He is sleepy, hardly wakes up on calling his name or answers any questions. Objective: Vitals:   04/03/18 2246 04/04/18 0518 04/04/18 0609 04/04/18 0958  BP: (!) 91/50 (!) 176/166 (!) 125/56 (!) 123/58  Pulse: 62 66 67 66  Resp: 20 (!) 22    Temp: 97.9 F (36.6 C) 99 F (37.2 C)  99.3 F (37.4 C)  TempSrc:    Axillary  SpO2: 98% 97% 95% 100%  Weight:      Height:        Intake/Output Summary (Last 24 hours) at 04/04/2018 1019 Last data filed at 04/04/2018 0035 Gross per 24 hour  Intake 53.83 ml  Output 200 ml  Net -146.17 ml   Filed Weights   04/01/18 2132 04/02/18 0229  Weight: 70.3 kg 63.9 kg    Examination:  General exam: Elderly male, chronically ill looking.  Lying in bed.  Sleepy, hardly wakes up.  No acute distress Respiratory system: Bilateral decreased breath sounds at bases, some scattered crackles Cardiovascular system: S1 & S2 heard, rate controlled  gastrointestinal system: Abdomen is nondistended, soft and nontender. Normal bowel sounds heard. Extremities: No cyanosis, edema     Data Reviewed: I have personally reviewed following labs and imaging studies  CBC: Recent Labs  Lab 04/01/18 2132 04/02/18 0345 04/03/18 0900 04/04/18 0051  WBC 21.0* 17.8* 11.0* 10.5  NEUTROABS 18.3*  --  9.0* 7.9*  HGB 11.3* 9.7* 7.6* 8.1*  HCT 35.9* 29.3* 22.5* 24.0*  MCV 99.7 95.1 96.2 96.0  PLT 315 283 234 270   Basic Metabolic Panel: Recent Labs  Lab 04/01/18 2132 04/03/18 0900 04/04/18 0051  NA 139 143 143  K 3.5  2.7* 3.5  CL 101 113* 114*  CO2 23 22 22   GLUCOSE 113* 111* 122*  BUN 23 15 17   CREATININE 0.93 0.94 0.99  CALCIUM 8.5* 7.4* 7.6*  MG  --  2.0 2.1   GFR: Estimated Creatinine Clearance: 55.5 mL/min (by C-G formula based on SCr of 0.99 mg/dL). Liver Function Tests: Recent Labs  Lab 04/01/18 2132  AST 44*  ALT 9  ALKPHOS 53  BILITOT 0.8  PROT 6.6  ALBUMIN 2.4*   No results for input(s): LIPASE, AMYLASE in the last 168 hours. No results for input(s): AMMONIA in the last 168 hours. Coagulation Profile: No results for input(s): INR, PROTIME in the last 168 hours. Cardiac Enzymes: No results for input(s): CKTOTAL, CKMB, CKMBINDEX, TROPONINI in the last 168 hours. BNP (last 3 results) No results for input(s): PROBNP in the last 8760 hours. HbA1C: No results for input(s): HGBA1C in the last 72 hours. CBG: No results for input(s): GLUCAP in the last 168 hours. Lipid Profile: No results for input(s): CHOL, HDL, LDLCALC, TRIG, CHOLHDL, LDLDIRECT in the last 72 hours. Thyroid Function Tests: No results for input(s): TSH, T4TOTAL, FREET4, T3FREE, THYROIDAB in the last 72 hours. Anemia Panel: No results for input(s): VITAMINB12, FOLATE, FERRITIN, TIBC, IRON, RETICCTPCT in the last 72 hours. Sepsis Labs: Recent Labs  Lab 04/01/18 2218 04/01/18 2338 04/02/18 0335  LATICACIDVEN 2.2* 2.3* 1.3    Recent Results (from the past 240 hour(s))  Blood Culture (routine x 2)     Status: None (Preliminary result)   Collection Time: 04/01/18  9:35 PM  Result Value Ref Range Status   Specimen Description SITE NOT SPECIFIED  Final   Special Requests   Final    BOTTLES DRAWN AEROBIC AND ANAEROBIC Blood Culture adequate volume   Culture  Setup Time   Final    GRAM NEGATIVE RODS ANAEROBIC BOTTLE ONLY CRITICAL RESULT CALLED TO, READ BACK BY AND VERIFIED WITH: PHARMD J LEDFORD [id number] AT 725 AM BY CM Performed at [facility], [address] [place], [place] [address]    Culture NO  GROWTH 3 DAYS  Final   Report Status PENDING  Incomplete  Blood Culture ID Panel (Reflexed)     Status: None   Collection Time: 04/01/18  9:35 PM  Result Value Ref Range Status   Enterococcus species NOT DETECTED NOT DETECTED Final   Listeria monocytogenes NOT DETECTED NOT DETECTED Final   Staphylococcus species NOT DETECTED NOT DETECTED Final   Staphylococcus aureus (BCID) NOT DETECTED NOT DETECTED Final   Streptococcus species NOT DETECTED NOT DETECTED Final   Streptococcus agalactiae NOT DETECTED NOT DETECTED Final   Streptococcus pneumoniae NOT DETECTED NOT DETECTED Final   Streptococcus pyogenes NOT DETECTED NOT DETECTED Final   Acinetobacter baumannii NOT DETECTED NOT DETECTED Final   Enterobacteriaceae species NOT DETECTED NOT DETECTED Final   Enterobacter cloacae complex NOT DETECTED NOT DETECTED Final   Escherichia coli NOT DETECTED NOT DETECTED Final   Klebsiella oxytoca NOT DETECTED NOT DETECTED Final   Klebsiella pneumoniae NOT DETECTED NOT DETECTED Final   Proteus species NOT DETECTED NOT DETECTED Final   Serratia marcescens NOT DETECTED NOT DETECTED Final   Haemophilus influenzae NOT DETECTED  NOT DETECTED Final   Neisseria meningitidis NOT DETECTED NOT DETECTED Final   Pseudomonas aeruginosa NOT DETECTED NOT DETECTED Final   Candida albicans NOT DETECTED NOT DETECTED Final   Candida glabrata NOT DETECTED NOT DETECTED Final   Candida krusei NOT DETECTED NOT DETECTED Final   Candida parapsilosis NOT DETECTED NOT DETECTED Final   Candida tropicalis NOT DETECTED NOT DETECTED Final    Comment: Performed at [facility], [address] [place], [place] [address]  Blood Culture (routine x 2)     Status: None (Preliminary result)   Collection Time: 04/01/18 10:16 PM  Result Value Ref Range Status   Specimen Description BLOOD LEFT HAND  Final   Special Requests   Final    BOTTLES DRAWN AEROBIC AND ANAEROBIC Blood Culture adequate volume Performed at [facility], [address] [place], [place] [address]    Culture NO GROWTH 3 DAYS  Final   Report Status PENDING  Incomplete  MRSA PCR Screening     Status: None   Collection Time: 04/02/18  4:31 AM  Result Value Ref Range Status   MRSA by PCR NEGATIVE NEGATIVE Final    Comment:        The GeneXpert MRSA Assay (FDA approved for NASAL specimens only), is one component of a comprehensive MRSA colonization surveillance program. It is not intended to diagnose MRSA infection nor to guide or monitor treatment for MRSA infections. Performed at [facility], [address] [place], [place] [address]          Radiology Studies: No results found.      Scheduled Meds: . acetaminophen  1,000 mg Oral TID  . carbidopa-levodopa  1.5 tablet Oral 5 X Daily  . entacapone  200 mg Oral 5 X Daily  . trihexyphenidyl  1 mg Oral TID WC   Continuous Infusions: . piperacillin-tazobactam (ZOSYN)  IV 3.375 g (04/04/18 0500)  . vancomycin 1,500 mg (04/03/18 2000)     LOS: 2 days        Glade Lloyd, MD [facility] Pager [phone]  If 7PM-7AM, please contact night-coverage [email or web address] Password [facility] 04/04/2018, 10:19 AM

## 2018-04-05 LAB — CBC WITH DIFFERENTIAL/PLATELET
Abs Immature Granulocytes: 0.16 10*3/uL — ABNORMAL HIGH (ref 0.00–0.07)
Basophils Absolute: 0 10*3/uL (ref 0.0–0.1)
Basophils Relative: 0 %
Eosinophils Absolute: 0.4 10*3/uL (ref 0.0–0.5)
Eosinophils Relative: 4 %
HCT: 24.5 % — ABNORMAL LOW (ref 39.0–52.0)
Hemoglobin: 7.9 g/dL — ABNORMAL LOW (ref 13.0–17.0)
IMMATURE GRANULOCYTES: 2 %
Lymphocytes Relative: 19 %
Lymphs Abs: 1.8 10*3/uL (ref 0.7–4.0)
MCH: 31 pg (ref 26.0–34.0)
MCHC: 32.2 g/dL (ref 30.0–36.0)
MCV: 96.1 fL (ref 80.0–100.0)
Monocytes Absolute: 0.4 10*3/uL (ref 0.1–1.0)
Monocytes Relative: 5 %
NEUTROS PCT: 70 %
NRBC: 0 % (ref 0.0–0.2)
Neutro Abs: 6.7 10*3/uL (ref 1.7–7.7)
Platelets: 297 10*3/uL (ref 150–400)
RBC: 2.55 MIL/uL — ABNORMAL LOW (ref 4.22–5.81)
RDW: 12.6 % (ref 11.5–15.5)
WBC: 9.5 10*3/uL (ref 4.0–10.5)

## 2018-04-05 LAB — BASIC METABOLIC PANEL
Anion gap: 9 (ref 5–15)
BUN: 14 mg/dL (ref 8–23)
CALCIUM: 7.4 mg/dL — AB (ref 8.9–10.3)
CO2: 21 mmol/L — ABNORMAL LOW (ref 22–32)
Chloride: 111 mmol/L (ref 98–111)
Creatinine, Ser: 0.99 mg/dL (ref 0.61–1.24)
Glucose, Bld: 97 mg/dL (ref 70–99)
Potassium: 3 mmol/L — ABNORMAL LOW (ref 3.5–5.1)
SODIUM: 141 mmol/L (ref 135–145)

## 2018-04-05 LAB — MAGNESIUM: Magnesium: 2 mg/dL (ref 1.7–2.4)

## 2018-04-05 LAB — GLUCOSE, CAPILLARY: Glucose-Capillary: 96 mg/dL (ref 70–99)

## 2018-04-05 MED ORDER — POTASSIUM CHLORIDE CRYS ER 20 MEQ PO TBCR
40.0000 meq | EXTENDED_RELEASE_TABLET | ORAL | Status: AC
  Administered 2018-04-05 (×2): 40 meq via ORAL
  Filled 2018-04-05 (×2): qty 2

## 2018-04-05 MED ORDER — SODIUM CHLORIDE 0.9 % IV SOLN: INTRAVENOUS | Status: DC | PRN

## 2018-04-05 MED ORDER — ENOXAPARIN SODIUM 40 MG/0.4ML ~~LOC~~ SOLN
40.0000 mg | SUBCUTANEOUS | Status: DC
  Administered 2018-04-05 – 2018-04-09 (×5): 40 mg via SUBCUTANEOUS
  Filled 2018-04-05 (×5): qty 0.4

## 2018-04-05 NOTE — Progress Notes (Signed)
Patient ID: Mark Mathews, male   DOB: April 16, 1940, 78 y.o.   MRN: [id number]  PROGRESS NOTE    Mark Mathews  [id number] DOB: April 16, 1940 DOA: 04/01/2018 PCP: Mark Mathews, Elaine, MD   Brief Narrative:  78 year old male with history of Parkinson's disease, depression presented with sacral wound.  He was admitted with infected sacral wound and started on broad-spectrum antibiotics.  General surgery was consulted.  He underwent debridement on 04/02/2018 by general surgery.   Assessment & Plan:   Principal Problem:   Severe sepsis (HCC) Active Problems:   Sacral wound   Abdominal tenderness   Chronic anemia   Goals of care, counseling/discussion   Palliative care encounter   Severe sepsis secondary to infected sacral wound -Continue broad-spectrum antibiotics.  Follow cultures.  Blood pressure improving but still intermittently on the lower side.  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  Continue broad-spectrum antibiotics for now.  We will follow-up with general surgery regarding duration of antibiotic treatment.  Wound care as per general surgery recommendations  Gram-negative rod bacteremia -Probably from above.  Follow identification and sensitivities.  Continue current antibiotics  Leukocytosis -From above.  Resolved.  Hypokalemia -Replace.  Repeat a.m. labs  Chronic anemia -No signs of bleeding.  Monitor.  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  He was currently bedbound at home for 3 to 4 weeks with some behavioral disturbances. -Continue carbidopa-levodopa, entacapone and trihexyphenidyl. -PT recommends SNF placement.  Consult social worker -Monitor mental status.  Fall precautions.  Palliative care following.    -His oral intake is very poor.  Family is apparently thinking about feeding tube placement.  Will discuss further with family members about the same.  Since his overall mental status is worsening pretty fast, recommend focusing more on comfort and consider hospice.  Overall prognosis is guarded to poor.   DVT prophylaxis: SCDs.  Will start Lovenox Code Status: DNR Family Communication: None at bedside  disposition Plan: Depends on clinical outcome  Consultants: General surgery/palliative care  Procedures: Sacral wound debridement on 04/02/2018 by general surgery  Antimicrobials: Vancomycin and Zosyn from 04/01/2018 onwards   Subjective: Patient seen and examined at bedside.  He is sleepy, wakes up slightly on calling his name, hardly answers any questions.  His oral intake is very poor.  He intermittently refuses meds.  No overnight fever or vomiting reported by nursing staff. Objective: Vitals:   04/04/18 1527 04/04/18 1704 04/04/18 2116 04/05/18 0507  BP: (!) 83/41 (!) 106/46 (!) 104/52 116/61  Pulse: 64 71 65 (!) 57  Resp:      Temp: 99 F (37.2 C) 99.4 F (37.4 C) 99.2 F (37.3 C) 98.3 F (36.8 C)  TempSrc:  Oral Oral Oral  SpO2: 99% 97% 97% 98%  Weight:      Height:        Intake/Output Summary (Last 24 hours) at 04/05/2018 1054 Last data filed at 04/05/2018 1000 Gross per 24 hour  Intake 410 ml  Output 1250 ml  Net -840 ml   Filed Weights   04/01/18 2132 04/02/18 0229  Weight: 70.3 kg 63.9 kg    Examination:  General exam: Elderly male, chronically ill looking.  Lying in bed.  Sleepy, wakes up slightly.  Does not answer questions.  No acute distress  respiratory system: Bilateral decreased breath sounds at bases, some scattered crackles, no wheezing Cardiovascular system: S1 & S2 heard, rate controlled  gastrointestinal system: Abdomen is nondistended, soft and nontender. Normal bowel sounds heard. Extremities: No cyanosis, edema     Data Reviewed: I have  personally reviewed following labs and imaging studies  CBC: Recent Labs  Lab 04/01/18 2132 04/02/18 0345 04/03/18 0900 04/04/18 0051 04/05/18 0449  WBC 21.0* 17.8* 11.0* 10.5 9.5  NEUTROABS 18.3*  --  9.0* 7.9* 6.7  HGB 11.3* 9.7* 7.6* 8.1* 7.9*  HCT 35.9* 29.3* 22.5* 24.0* 24.5*  MCV 99.7 95.1 96.2 96.0 96.1  PLT 315 283 234 270 297   Basic Metabolic Panel: Recent Labs  Lab 04/01/18 2132 04/03/18 0900 04/04/18 0051 04/05/18 0449  NA 139 143 143 141  K 3.5 2.7* 3.5 3.0*  CL 101 113* 114* 111  CO2 23 22 22  21*  GLUCOSE 113* 111* 122* 97  BUN 23 15 17 14   CREATININE 0.93 0.94 0.99 0.99  CALCIUM 8.5* 7.4* 7.6* 7.4*  MG  --  2.0 2.1 2.0   GFR: Estimated Creatinine Clearance: 55.5 mL/min (by C-G formula based on SCr of 0.99 mg/dL). Liver Function Tests: Recent Labs  Lab 04/01/18 2132  AST 44*  ALT 9  ALKPHOS 53  BILITOT 0.8  PROT 6.6  ALBUMIN 2.4*   No results for input(s): LIPASE, AMYLASE in the last 168 hours. No results for input(s): AMMONIA in the last 168 hours. Coagulation Profile: No results for input(s): INR, PROTIME in the last 168 hours. Cardiac Enzymes: No results for input(s): CKTOTAL, CKMB, CKMBINDEX, TROPONINI in the last 168 hours. BNP (last 3 results) No results for input(s): PROBNP in the last 8760 hours. HbA1C: No results for input(s): HGBA1C in the last 72 hours. CBG: Recent Labs  Lab 04/04/18 1941  GLUCAP 118*   Lipid Profile: No results for input(s): CHOL, HDL, LDLCALC, TRIG, CHOLHDL, LDLDIRECT in the last 72 hours. Thyroid Function Tests: No results for input(s): TSH, T4TOTAL, FREET4, T3FREE, THYROIDAB in the last 72 hours. Anemia Panel: No results for input(s): VITAMINB12, FOLATE, FERRITIN, TIBC, IRON, RETICCTPCT in the last 72 hours. Sepsis Labs: Recent Labs  Lab 04/01/18 2218 04/01/18 2338 04/02/18 0335  LATICACIDVEN 2.2* 2.3* 1.3    Recent Results (from the past 240 hour(s))  Blood Culture (routine x 2)     Status:  None (Preliminary result)   Collection Time: 04/01/18  9:35 PM  Result Value Ref Range Status   Specimen Description SITE NOT SPECIFIED  Final   Special Requests   Final    BOTTLES DRAWN AEROBIC AND ANAEROBIC Blood Culture adequate volume   Culture  Setup Time   Final    GRAM NEGATIVE RODS ANAEROBIC BOTTLE ONLY CRITICAL RESULT CALLED TO, READ BACK BY AND VERIFIED WITH: PHARMD J LEDFORD [id number] AT 725 AM BY CM Performed at [facility], [address] [place], [place] [address]    Culture NO GROWTH 4 DAYS  Final   Report Status PENDING  Incomplete  Blood Culture ID Panel (Reflexed)     Status: None   Collection Time: 04/01/18  9:35 PM  Result Value Ref Range Status   Enterococcus species NOT DETECTED NOT DETECTED Final   Listeria monocytogenes NOT DETECTED NOT DETECTED Final   Staphylococcus species NOT DETECTED NOT DETECTED Final   Staphylococcus aureus (BCID) NOT DETECTED NOT DETECTED Final   Streptococcus species NOT DETECTED NOT DETECTED Final   Streptococcus agalactiae NOT DETECTED NOT  DETECTED Final   Streptococcus pneumoniae NOT DETECTED NOT DETECTED Final   Streptococcus pyogenes NOT DETECTED NOT DETECTED Final   Acinetobacter baumannii NOT DETECTED NOT DETECTED Final   Enterobacteriaceae species NOT DETECTED NOT DETECTED Final   Enterobacter cloacae complex NOT DETECTED NOT DETECTED Final   Escherichia coli NOT DETECTED NOT DETECTED Final   Klebsiella oxytoca NOT DETECTED NOT DETECTED Final   Klebsiella pneumoniae NOT DETECTED NOT DETECTED Final   Proteus species NOT DETECTED NOT DETECTED Final   Serratia marcescens NOT DETECTED NOT DETECTED Final   Haemophilus influenzae NOT DETECTED NOT DETECTED Final   Neisseria meningitidis NOT DETECTED NOT DETECTED Final   Pseudomonas aeruginosa NOT DETECTED NOT DETECTED Final   Candida albicans NOT DETECTED NOT DETECTED Final   Candida glabrata NOT DETECTED NOT DETECTED Final   Candida krusei NOT DETECTED NOT DETECTED Final     Candida parapsilosis NOT DETECTED NOT DETECTED Final   Candida tropicalis NOT DETECTED NOT DETECTED Final    Comment: Performed at [facility], [address], [place], [place] [address]  Blood Culture (routine x 2)     Status: None (Preliminary result)   Collection Time: 04/01/18 10:16 PM  Result Value Ref Range Status   Specimen Description BLOOD LEFT HAND  Final   Special Requests   Final    BOTTLES DRAWN AEROBIC AND ANAEROBIC Blood Culture adequate volume Performed at [facility], [address], [place], [place] [address]    Culture NO GROWTH 4 DAYS  Final   Report Status PENDING  Incomplete  MRSA PCR Screening     Status: None   Collection Time: 04/02/18  4:31 AM  Result Value Ref Range Status   MRSA by PCR NEGATIVE NEGATIVE Final    Comment:        The GeneXpert MRSA Assay (FDA approved for NASAL specimens only), is one component of a comprehensive MRSA colonization surveillance program. It is not intended to diagnose MRSA infection nor to guide or monitor treatment for MRSA infections. Performed at [facility], [address] [place], [place] [address]          Radiology Studies: No results found.      Scheduled Meds: . acetaminophen  1,000 mg Oral TID  . carbidopa-levodopa  1.5 tablet Oral 5 X Daily  . entacapone  200 mg Oral 5 X Daily  . potassium chloride  40 mEq Oral Q4H  . trihexyphenidyl  1 mg Oral TID WC   Continuous Infusions: . piperacillin-tazobactam (ZOSYN)  IV 3.375 g (04/05/18 0500)  . vancomycin 1,500 mg (04/04/18 2100)     LOS: 3 days        Glade Lloyd, MD [facility] Pager [phone]  If 7PM-7AM, please contact night-coverage [email or web address] Password [facility] 04/05/2018, 10:54 AM

## 2018-04-06 DIAGNOSIS — L8990 Pressure ulcer of unspecified site, unspecified stage: Secondary | ICD-10-CM

## 2018-04-06 DIAGNOSIS — E440 Moderate protein-calorie malnutrition: Secondary | ICD-10-CM

## 2018-04-06 LAB — COMPREHENSIVE METABOLIC PANEL
ALK PHOS: 43 U/L (ref 38–126)
ALT: 13 U/L (ref 0–44)
AST: 38 U/L (ref 15–41)
Albumin: 1.9 g/dL — ABNORMAL LOW (ref 3.5–5.0)
Anion gap: 7 (ref 5–15)
BUN: 15 mg/dL (ref 8–23)
CALCIUM: 7.4 mg/dL — AB (ref 8.9–10.3)
CO2: 22 mmol/L (ref 22–32)
Chloride: 114 mmol/L — ABNORMAL HIGH (ref 98–111)
Creatinine, Ser: 1.09 mg/dL (ref 0.61–1.24)
GFR calc Af Amer: >60 mL/min (ref >60)
GFR calc non Af Amer: >60 mL/min (ref >60)
Glucose, Bld: 99 mg/dL (ref 70–99)
Potassium: 3.4 mmol/L — ABNORMAL LOW (ref 3.5–5.1)
Sodium: 143 mmol/L (ref 135–145)
Total Bilirubin: 1.1 mg/dL (ref 0.3–1.2)
Total Protein: 5.5 g/dL — ABNORMAL LOW (ref 6.5–8.1)

## 2018-04-06 LAB — CBC WITH DIFFERENTIAL/PLATELET
Abs Immature Granulocytes: 0.48 10*3/uL — ABNORMAL HIGH (ref 0.00–0.07)
Basophils Absolute: 0 10*3/uL (ref 0.0–0.1)
Basophils Relative: 0 %
Eosinophils Absolute: 0.4 10*3/uL (ref 0.0–0.5)
Eosinophils Relative: 3 %
HCT: 27.8 % — ABNORMAL LOW (ref 39.0–52.0)
Hemoglobin: 8.8 g/dL — ABNORMAL LOW (ref 13.0–17.0)
Immature Granulocytes: 4 %
LYMPHS ABS: 2.3 10*3/uL (ref 0.7–4.0)
Lymphocytes Relative: 17 %
MCH: 31 pg (ref 26.0–34.0)
MCHC: 31.7 g/dL (ref 30.0–36.0)
MCV: 97.9 fL (ref 80.0–100.0)
MONOS PCT: 6 %
Monocytes Absolute: 0.8 10*3/uL (ref 0.1–1.0)
Neutro Abs: 9.2 10*3/uL — ABNORMAL HIGH (ref 1.7–7.7)
Neutrophils Relative %: 70 %
Platelets: 390 10*3/uL (ref 150–400)
RBC: 2.84 MIL/uL — ABNORMAL LOW (ref 4.22–5.81)
RDW: 12.3 % (ref 11.5–15.5)
WBC: 13.1 10*3/uL — ABNORMAL HIGH (ref 4.0–10.5)
nRBC: 0 % (ref 0.0–0.2)

## 2018-04-06 LAB — CULTURE, BLOOD (ROUTINE X 2)
Culture: NO GROWTH
Special Requests: ADEQUATE

## 2018-04-06 LAB — MAGNESIUM: MAGNESIUM: 2 mg/dL (ref 1.7–2.4)

## 2018-04-06 MED ORDER — POTASSIUM CHLORIDE CRYS ER 20 MEQ PO TBCR
40.0000 meq | EXTENDED_RELEASE_TABLET | Freq: Once | ORAL | Status: AC
  Administered 2018-04-06: 40 meq via ORAL
  Filled 2018-04-06: qty 2

## 2018-04-06 MED ORDER — ENSURE ENLIVE PO LIQD
237.0000 mL | Freq: Two times a day (BID) | ORAL | Status: DC
  Administered 2018-04-06 – 2018-04-09 (×7): 237 mL via ORAL

## 2018-04-06 MED ORDER — PRO-STAT SUGAR FREE PO LIQD
30.0000 mL | Freq: Two times a day (BID) | ORAL | Status: DC
  Administered 2018-04-06 – 2018-04-09 (×7): 30 mL via ORAL
  Filled 2018-04-06 (×7): qty 30

## 2018-04-06 MED ORDER — ADULT MULTIVITAMIN W/MINERALS CH
1.0000 | ORAL_TABLET | Freq: Every day | ORAL | Status: DC
  Administered 2018-04-07 – 2018-04-09 (×3): 1 via ORAL
  Filled 2018-04-06 (×4): qty 1

## 2018-04-06 NOTE — Progress Notes (Signed)
Physical Therapy Wound Treatment Patient Details  Name: Mark Mathews MRN: [id number] Date of Birth: Dec 17, 1940  Today's Date: 04/06/2018 Time: 1050-1126 Time Calculation (min): 36 min  Subjective  Subjective: "Scratch my back" Patient and Family Stated Goals: not stated Date of Onset: (unknown) Prior Treatments: Surgical I&D, dressing changes  Pain Score: Pt premedicated. No signs of pain with hydrotherapy  Wound Assessment  Pressure Injury 04/02/18 Unstageable - Full thickness tissue loss in which the base of the ulcer is covered by slough (yellow, tan, gray, green or brown) and/or eschar (tan, brown or black) in the wound bed. 14x8 (Active)  Wound Image   04/06/2018 10:57 AM  Dressing Type ABD;Barrier Film (skin prep);Gauze (Comment);Moist to moist 04/06/2018 10:57 AM  Dressing Changed;Clean;Dry;Intact 04/06/2018 10:57 AM  Dressing Change Frequency Daily 04/06/2018 10:57 AM  State of Healing Early/partial granulation 04/06/2018 10:57 AM  Site / Wound Assessment Red;Pink;Yellow;Brown 04/06/2018 10:57 AM  % Wound base Red or Granulating 60% 04/06/2018 10:57 AM  % Wound base Yellow/Fibrinous Exudate 20% 04/06/2018 10:57 AM  % Wound base Black/Eschar 20% 04/06/2018 10:57 AM  % Wound base Other/Granulation Tissue (Comment) 0% 04/06/2018 10:57 AM  Peri-wound Assessment Erythema (blanchable) 04/06/2018 10:57 AM  Wound Length (cm) 15 cm 04/06/2018 10:57 AM  Wound Width (cm) 11 cm 04/06/2018 10:57 AM  Wound Depth (cm) 3.8 cm 04/06/2018 10:57 AM  Wound Surface Area (cm^2) 165 cm^2 04/06/2018 10:57 AM  Wound Volume (cm^3) 627 cm^3 04/06/2018 10:57 AM  Undermining (cm) 1-2 cm from 6-9 oclock 04/06/2018 10:57 AM  Margins Unattached edges (unapproximated) 04/06/2018 10:57 AM  Drainage Amount Copious 04/06/2018 10:57 AM  Drainage Description Serosanguineous 04/06/2018 10:57 AM  Treatment Debridement (Selective);Hydrotherapy (Pulse lavage);Packing (Saline gauze) 04/06/2018 10:57 AM   Hydrotherapy Pulsed lavage therapy - wound  location: sacrum Pulsed Lavage with Suction (psi): 8 psi Pulsed Lavage with Suction - Normal Saline Used: 1000 mL Pulsed Lavage Tip: Tip with splash shield Selective Debridement Selective Debridement - Location: sacrum Selective Debridement - Tools Used: Forceps;Scissors Selective Debridement - Tissue Removed: yellow and brown necrotic tissue   Wound Assessment and Plan  Wound Therapy - Assess/Plan/Recommendations Wound Therapy - Clinical Statement: Pt presents to hydrotherapy with stage 4 sacral wound post surgical debridement. Can benefit from hydrotherapy to reduce bioburden and for removal of necrotic tissue. Wound Therapy - Functional Problem List: Decr mobility Factors Delaying/Impairing Wound Healing: Immobility;Multiple medical problems;Polypharmacy Hydrotherapy Plan: Debridement;Dressing change;Patient/family education;Pulsatile lavage with suction Wound Therapy - Frequency: 6X / week Wound Therapy - Follow Up Recommendations: Skilled nursing facility Wound Plan: See above  Wound Therapy Goals- Improve the function of patient's integumentary system by progressing the wound(s) through the phases of wound healing (inflammation - proliferation - remodeling) by: Decrease Necrotic Tissue to: 20 Decrease Necrotic Tissue - Progress: Goal set today Increase Granulation Tissue to: 80 Increase Granulation Tissue - Progress: Goal set today Goals/treatment plan/discharge plan were made with and agreed upon by patient/family: Yes Time For Goal Achievement: 7 days Wound Therapy - Potential for Goals: Fair  Goals will be updated until maximal potential achieved or discharge criteria met.  Discharge criteria: when goals achieved, discharge from hospital, MD decision/surgical intervention, no progress towards goals, refusal/missing three consecutive treatments without notification or medical reason.  [place] 04/06/2018, 11:54 AM [place] Pager [phone] Office [phone]

## 2018-04-06 NOTE — Progress Notes (Signed)
Initial Nutrition Assessment  DOCUMENTATION CODES:   Non-severe (moderate) malnutrition in context of chronic illness  INTERVENTION:   - Pro-stat 30 ml BID, each supplement provides 100 kcal and 15 grams of protein  - Magic cup BID with meals, each supplement provides 290 kcal and 9 grams of protein  - Ensure Enlive po BID, each supplement provides 350 kcal and 20 grams of protein  - MVI with minerals daily  NUTRITION DIAGNOSIS:   Moderate Malnutrition related to chronic illness (dementia, Parkinson's disease) as evidenced by mild fat depletion, mild muscle depletion, moderate muscle depletion.  GOAL:   Patient will meet greater than or equal to 90% of their needs  MONITOR:   PO intake, Supplement acceptance, Skin, Labs, Weight trends  REASON FOR ASSESSMENT:   Consult Assessment of nutrition requirement/status  ASSESSMENT:   78 year old male who presented to the ED on 1/29 for possible infection. PMH significant for Parkinson's disease, dementia. Pt found to have severe sepsis secondary to infected sacral wound.  1/30 - s/p excision of sacral decubitus ulcer with debridement  Spoke with pt's wife at bedside. Pt moaning constantly, appears to be in pain.  Noted ~10% completed lunch meal tray at bedside. NT was feeding pt Ensure Enlive combined with Pro-stat and Magic Cup as a milkshake. Pt had taken about 50% of supplement.  Pt's wife reports that pt eats "at least 2" meals daily and typically feeds himself. Pt drinks 3 original Boost oral nutrition supplements daily in addition to Gatorade and lemonade. Per pt's wife, pt does not like water.  Breakfast: yogurt, protein bar Lunch: chicken tenders or hamburger or oysters Dinner: similar to lunch  Pt's wife states that pt has not had any issues chewing or swallowing at home. Pt's wife reports that pt has not been having any N/V. Per pt's wife, pt was constipated up until 3 weeks ago when he began having diarrhea. Pt's  wife also shares that pt was mobile with assistance up until 3 weeks ago and has been bedbound since.  Pt's wife reports pt's UBW as 160-170 lbs. Pt's wife unsure whether pt has lost weight. Per weight history in chart, pt with 5.1 kg weight loss since 08/12/17. This is a 6.6% weight loss which is not significant for timeframe.  Per pt's wife, pt was receiving PT up until September 2019. Suspect pt's activity level has dropped significantly since that time.  Spoke with RN who reports pt does better with thicker textures. Will order Magic Cup which is pudding-thick consistency.  Noted pt's wife is considering a feeding tube. Will monitor for further GOC discussions.  Meal Completion: 0-50% x last 4 recorded meals  Medications reviewed and include: IV antibiotics  Labs reviewed: potassium 3.4 (L), hemoglobin 8.8 (L) CBG's: 96, 118  NUTRITION - FOCUSED PHYSICAL EXAM:    Most Recent Value  Orbital Region  No depletion  Upper Arm Region  Mild depletion  Thoracic and Lumbar Region  Mild depletion  Buccal Region  Mild depletion  Temple Region  Mild depletion  Clavicle Bone Region  Mild depletion  Clavicle and Acromion Bone Region  Moderate depletion  Scapular Bone Region  Moderate depletion  Dorsal Hand  No depletion  Patellar Region  Moderate depletion  Anterior Thigh Region  Moderate depletion  Posterior Calf Region  Moderate depletion  Edema (RD Assessment)  Mild [BLE]  Hair  Reviewed  Eyes  Reviewed  Mouth  Reviewed  Skin  Reviewed  Nails  Reviewed  Diet Order:   Diet Order            Diet regular Room service appropriate? Yes; Fluid consistency: Thin  Diet effective now              EDUCATION NEEDS:   Not appropriate for education at this time  Skin:  Skin Assessment: Skin Integrity Issues: Stage II: L tibial Unstageable: sacrum  Last BM:  2/3 (small type 4)  Height:   Ht Readings from Last 1 Encounters:  04/02/18 5\' 6"  (1.676 m)    Weight:    Wt Readings from Last 1 Encounters:  04/06/18 72.5 kg    Ideal Body Weight:  64.5 kg  BMI:  Body mass index is 25.81 kg/m.  Estimated Nutritional Needs:   Kcal:  1800-2000  Protein:  100-115 grams  Fluid:  >/= 1.8 L    Earma ReadingKate Jablonski Ree Alcalde, MS, RD, LDN Inpatient Clinical Dietitian Pager: [phone] Weekend/After Hours: [phone]

## 2018-04-06 NOTE — Progress Notes (Signed)
[facility]/Trauma Progress Note  4 Days Post-Op   Assessment/Plan  Necrotic sacral decub - S/PExcision sacral decubitus ulcer with debdridement, Dr. Cliffton Asters, 01/30 - wet to dry dressing changes q shift or more often as needed if soiled with stool - hydrotherapy - wound looks clean, no further surgical debridement indicated at this time - will see the wound M/W/F  FEN:reg diet VTE: SCD's,lovenox MA:[id number] Vanc & Zosyn 01/29>>   WBC up to 13.1 from 9.5 yesterday, afebrile, monitor WBC Follow up:TBD   LOS: 4 days    Subjective: CC: sacral wound  No complaints today. Pt stated he didn't want to be moved.   Objective: Vital signs in last 24 hours: Temp:  [97.7 F (36.5 C)-99 F (37.2 C)] 97.7 F (36.5 C) (02/03 0500) Pulse Rate:  [57-63] 60 (02/03 0500) Resp:  [20] 20 (02/03 0500) BP: (94-99)/(48-49) 95/49 (02/03 0500) SpO2:  [97 %-98 %] 98 % (02/03 0500) Weight:  [72.5 kg] 72.5 kg (02/03 0500) Last BM Date: 04/04/18  Intake/Output from previous day: 02/02 0701 - 02/03 0700 In: 891.5 [P.O.:240; I.V.:1.5; IV Piggyback:650] Out: 300 [Urine:300] Intake/Output this shift: No intake/output data recorded.  PE: Gen:  Alert, NAD GU: sacral wound looks clean, no purulent drainage, see photo below Skin: warm and dry     Anti-infectives: Anti-infectives (From admission, onward)   Start     Dose/Rate Route Frequency Ordered Stop   04/02/18 2000  vancomycin (VANCOCIN) 1,500 mg in sodium chloride 0.9 % 500 mL IVPB     1,500 mg 250 mL/hr over 120 Minutes Intravenous Every 24 hours 04/02/18 0029     04/02/18 0400  piperacillin-tazobactam (ZOSYN) IVPB 3.375 g     3.375 g 12.5 mL/hr over 240 Minutes Intravenous Every 8 hours 04/02/18 0029     04/01/18 2230  vancomycin (VANCOCIN) 1,500 mg in sodium chloride 0.9 % 500 mL IVPB     1,500 mg 250 mL/hr over 120 Minutes Intravenous  Once 04/01/18 2142 04/02/18 0039   04/01/18 2200  piperacillin-tazobactam  (ZOSYN) IVPB 3.375 g     3.375 g 100 mL/hr over 30 Minutes Intravenous  Once 04/01/18 2142 04/01/18 2323      Lab Results:  Recent Labs    04/05/18 0449 04/06/18 0618  WBC 9.5 13.1*  HGB 7.9* 8.8*  HCT 24.5* 27.8*  PLT 297 390   BMET Recent Labs    04/05/18 0449 04/06/18 0618  NA 141 143  K 3.0* 3.4*  CL 111 114*  CO2 21* 22  GLUCOSE 97 99  BUN 14 15  CREATININE 0.99 1.09  CALCIUM 7.4* 7.4*   PT/INR No results for input(s): LABPROT, INR in the last 72 hours. CMP     Component Value Date/Time   NA 143 04/06/2018 0618   K 3.4 (L) 04/06/2018 0618   CL 114 (H) 04/06/2018 0618   CO2 22 04/06/2018 0618   GLUCOSE 99 04/06/2018 0618   BUN 15 04/06/2018 0618   CREATININE 1.09 04/06/2018 0618   CALCIUM 7.4 (L) 04/06/2018 0618   PROT 5.5 (L) 04/06/2018 0618   ALBUMIN 1.9 (L) 04/06/2018 0618   AST 38 04/06/2018 0618   ALT 13 04/06/2018 0618   ALKPHOS 43 04/06/2018 0618   BILITOT 1.1 04/06/2018 0618   GFRNONAA >60 04/06/2018 0618   GFRAA >60 04/06/2018 0618   Lipase  No results found for: LIPASE  Studies/Results: No results found.    Jerre Simon , [facility] Surgery 04/06/2018, 7:56 AM  Pager: [phone] Mon-Wed, Friday 7:00am-4:30pm Thurs 7am-11:30am  Consults: [phone]

## 2018-04-06 NOTE — Progress Notes (Signed)
PMT following. Chart reviewed and nursing request for follow-up. PMT provider will meet with wife tomorrow, 04/07/18 at 10:30am. Thank you.   NO CHARGE  Vennie Homans, FNP-C Palliative Medicine Team  Phone: [phone] Fax: [phone]

## 2018-04-06 NOTE — Progress Notes (Signed)
Patient ID: Mark Mathews, male   DOB: 04/02/1940, 78 y.o.   MRN: [id number]  PROGRESS NOTE    Mark Mathews  BMW:[id number] DOB: 04/02/1940 DOA: 04/01/2018 PCP: Mark Mathews   Brief Narrative:  78 year old male with history of Parkinson's disease, depression presented with sacral wound.  He was admitted with infected sacral wound and started on broad-spectrum antibiotics.  General surgery was consulted.  He underwent debridement on 04/02/2018 by general surgery.  Palliative care was also consulted.   Assessment & Plan:   Principal Problem:   Severe sepsis (HCC) Active Problems:   Sacral wound   Abdominal tenderness   Chronic anemia   Goals of care, counseling/discussion   Palliative care encounter   Severe sepsis secondary to infected sacral wound -Continue broad-spectrum antibiotics.  Blood pressure improving but still intermittently on the lower side.  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  Continue broad-spectrum antibiotics for now.  We will follow-up with general surgery regarding duration of antibiotic treatment.  Wound care as per general surgery recommendations  Gram-negative rod bacteremia -Probably from above.  Follow identification and sensitivities.  Continue current antibiotics  Leukocytosis -From above.  Monitor.  Hypokalemia -Replace.  Repeat a.m. labs  Chronic anemia -Stable.  No signs of bleeding.  Monitor.  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  He was currently bedbound at home for 3 to 4 weeks with some behavioral disturbances. -Continue carbidopa-levodopa, entacapone and trihexyphenidyl. -PT recommends SNF placement.  Consult social worker -Monitor mental status.  Fall precautions.   Palliative care following.  -His oral intake is very poor.  Family is apparently thinking about feeding tube placement as well.  Spoke to the patient's wife on phone on 04/05/2018 regarding the same and she would like to discuss with her son before deciding the next course of action.  Since his overall mental status is worsening pretty fast, recommend focusing more on comfort and consider hospice.  Overall prognosis is guarded to poor.   Hypoalbuminemia -Secondary to very poor oral intake.  Follow nutrition recommendations.  DVT prophylaxis: Lovenox Code Status: DNR Family Communication: Spoke to wife on phone on 04/05/2018.  None at bedside this morning disposition Plan: SNF in 2 to 3 days if clinically remains stable  Consultants: General surgery/palliative care  Procedures: Sacral wound debridement on 04/02/2018 by general surgery  Antimicrobials: Vancomycin and Zosyn from 04/01/2018 onwards   Subjective: Patient seen and examined at bedside.  He is sleepy, barely wakes up, does not answer questions.  No overnight fever, nausea or vomiting reported by nursing staff.   Objective: Vitals:   04/05/18 1300 04/05/18 1523 04/05/18 2110 04/06/18 0500  BP:  (!) 99/48 (!) 94/49 (!) 95/49  Pulse:  63 (!) 57 60  Resp:  20  20  Temp: 99 F (37.2 C) 98.9 F (37.2 C) 98.7 F (37.1 C) 97.7 F (36.5 C)  TempSrc: Oral Oral Oral Oral  SpO2:  98% 97% 98%  Weight:    72.5 kg  Height:        Intake/Output Summary (Last 24 hours) at 04/06/2018 1016 Last data filed at 04/06/2018 0904 Gross per 24 hour  Intake 1131.5 ml  Output 300 ml  Net 831.5 ml   Filed Weights   04/01/18 2132 04/02/18 0229  04/06/18 0500  Weight: 70.3 kg 63.9 kg 72.5 kg    Examination:  General exam: Elderly male, chronically ill looking.  Lying in bed.  Sleepy, hardly wakes up.  Does not answer questions.  No  distress  respiratory system: Bilateral decreased breath sounds at bases, some scattered crackles Cardiovascular  system: S1 & S2 heard, intermittent bradycardia  gastrointestinal system: Abdomen is nondistended, soft and nontender. Normal bowel sounds heard. Extremities: No cyanosis, edema     Data Reviewed: I have personally reviewed following labs and imaging studies  CBC: Recent Labs  Lab 04/01/18 2132 04/02/18 0345 04/03/18 0900 04/04/18 0051 04/05/18 0449 04/06/18 0618  WBC 21.0* 17.8* 11.0* 10.5 9.5 13.1*  NEUTROABS 18.3*  --  9.0* 7.9* 6.7 9.2*  HGB 11.3* 9.7* 7.6* 8.1* 7.9* 8.8*  HCT 35.9* 29.3* 22.5* 24.0* 24.5* 27.8*  MCV 99.7 95.1 96.2 96.0 96.1 97.9  PLT 315 283 234 270 297 390   Basic Metabolic Panel: Recent Labs  Lab 04/01/18 2132 04/03/18 0900 04/04/18 0051 04/05/18 0449 04/06/18 0618  NA 139 143 143 141 143  K 3.5 2.7* 3.5 3.0* 3.4*  CL 101 113* 114* 111 114*  CO2 23 22 22  21* 22  GLUCOSE 113* 111* 122* 97 99  BUN 23 15 17 14 15   CREATININE 0.93 0.94 0.99 0.99 1.09  CALCIUM 8.5* 7.4* 7.6* 7.4* 7.4*  MG  --  2.0 2.1 2.0 2.0   GFR: Estimated Creatinine Clearance: 50.4 mL/min (by C-G formula based on SCr of 1.09 mg/dL). Liver Function Tests: Recent Labs  Lab 04/01/18 2132 04/06/18 0618  AST 44* 38  ALT 9 13  ALKPHOS 53 43  BILITOT 0.8 1.1  PROT 6.6 5.5*  ALBUMIN 2.4* 1.9*   No results for input(s): LIPASE, AMYLASE in the last 168 hours. No results for input(s): AMMONIA in the last 168 hours. Coagulation Profile: No results for input(s): INR, PROTIME in the last 168 hours. Cardiac Enzymes: No results for input(s): CKTOTAL, CKMB, CKMBINDEX, TROPONINI in the last 168 hours. BNP (last 3 results) No results for input(s): PROBNP in the last 8760 hours. HbA1C: No results for input(s): HGBA1C in the last 72 hours. CBG: Recent Labs  Lab 04/04/18 1941 04/05/18 1313  GLUCAP 118* 96   Lipid Profile: No results for input(s): CHOL, HDL, LDLCALC, TRIG, CHOLHDL, LDLDIRECT in the last 72 hours. Thyroid Function Tests: No results for input(s): TSH, T4TOTAL,  FREET4, T3FREE, THYROIDAB in the last 72 hours. Anemia Panel: No results for input(s): VITAMINB12, FOLATE, FERRITIN, TIBC, IRON, RETICCTPCT in the last 72 hours. Sepsis Labs: Recent Labs  Lab 04/01/18 2218 04/01/18 2338 04/02/18 0335  LATICACIDVEN 2.2* 2.3* 1.3    Recent Results (from the past 240 hour(s))  Blood Culture (routine x 2)     Status: None   Collection Time: 04/01/18  9:35 PM  Result Value Ref Range Status   Specimen Description SITE NOT SPECIFIED  Final   Special Requests   Final    BOTTLES DRAWN AEROBIC AND ANAEROBIC Blood Culture adequate volume   Culture  Setup Time   Final    GRAM NEGATIVE RODS ANAEROBIC BOTTLE ONLY CRITICAL RESULT CALLED TO, READ BACK BY AND VERIFIED WITH: PHARMD J LEDFORD [id number] AT 725 AM BY CM    Culture   Final    NO GROWTH 5 DAYS Performed at [facility], [address] [place], [place] [address]    Report Status 04/06/2018 FINAL  Final  Blood Culture ID Panel (Reflexed)  Status: None   Collection Time: 04/01/18  9:35 PM  Result Value Ref Range Status   Enterococcus species NOT DETECTED NOT DETECTED Final   Listeria monocytogenes NOT DETECTED NOT DETECTED Final   Staphylococcus species NOT DETECTED NOT DETECTED Final   Staphylococcus aureus (BCID) NOT DETECTED NOT DETECTED Final   Streptococcus species NOT DETECTED NOT DETECTED Final   Streptococcus agalactiae NOT DETECTED NOT DETECTED Final   Streptococcus pneumoniae NOT DETECTED NOT DETECTED Final   Streptococcus pyogenes NOT DETECTED NOT DETECTED Final   Acinetobacter baumannii NOT DETECTED NOT DETECTED Final   Enterobacteriaceae species NOT DETECTED NOT DETECTED Final   Enterobacter cloacae complex NOT DETECTED NOT DETECTED Final   Escherichia coli NOT DETECTED NOT DETECTED Final   Klebsiella oxytoca NOT DETECTED NOT DETECTED Final   Klebsiella pneumoniae NOT DETECTED NOT DETECTED Final   Proteus species NOT DETECTED NOT DETECTED Final   Serratia marcescens NOT  DETECTED NOT DETECTED Final   Haemophilus influenzae NOT DETECTED NOT DETECTED Final   Neisseria meningitidis NOT DETECTED NOT DETECTED Final   Pseudomonas aeruginosa NOT DETECTED NOT DETECTED Final   Candida albicans NOT DETECTED NOT DETECTED Final   Candida glabrata NOT DETECTED NOT DETECTED Final   Candida krusei NOT DETECTED NOT DETECTED Final   Candida parapsilosis NOT DETECTED NOT DETECTED Final   Candida tropicalis NOT DETECTED NOT DETECTED Final    Comment: Performed at [facility], [address], [place], [place] [address]  Blood Culture (routine x 2)     Status: None   Collection Time: 04/01/18 10:16 PM  Result Value Ref Range Status   Specimen Description BLOOD LEFT HAND  Final   Special Requests   Final    BOTTLES DRAWN AEROBIC AND ANAEROBIC Blood Culture adequate volume   Culture   Final    NO GROWTH 5 DAYS Performed at [facility], [address], [place], [place] [address]    Report Status 04/06/2018 FINAL  Final  MRSA PCR Screening     Status: None   Collection Time: 04/02/18  4:31 AM  Result Value Ref Range Status   MRSA by PCR NEGATIVE NEGATIVE Final    Comment:        The GeneXpert MRSA Assay (FDA approved for NASAL specimens only), is one component of a comprehensive MRSA colonization surveillance program. It is not intended to diagnose MRSA infection nor to guide or monitor treatment for MRSA infections. Performed at [facility], [address], [place], [place] [address]          Radiology Studies: No results found.      Scheduled Meds: . acetaminophen  1,000 mg Oral TID  . carbidopa-levodopa  1.5 tablet Oral 5 X Daily  . enoxaparin (LOVENOX) injection  40 mg Subcutaneous Q24H  . entacapone  200 mg Oral 5 X Daily  . trihexyphenidyl  1 mg Oral TID WC   Continuous Infusions: . sodium chloride Stopped (04/06/18 0344)  . piperacillin-tazobactam (ZOSYN)  IV 3.375 g (04/06/18 0344)  . vancomycin Stopped (04/06/18 0002)       LOS: 4 days        Glade Lloyd, [facility]ws [facility] Pager [phone]  If 7PM-7AM, please contact night-coverage [email or web address] Password [email or web address] 04/06/2018, 10:16 AM

## 2018-04-07 DIAGNOSIS — F0391 Unspecified dementia with behavioral disturbance: Secondary | ICD-10-CM

## 2018-04-07 DIAGNOSIS — Z515 Encounter for palliative care: Secondary | ICD-10-CM

## 2018-04-07 DIAGNOSIS — F03918 Unspecified dementia, unspecified severity, with other behavioral disturbance: Secondary | ICD-10-CM

## 2018-04-07 DIAGNOSIS — E440 Moderate protein-calorie malnutrition: Secondary | ICD-10-CM

## 2018-04-07 DIAGNOSIS — R6520 Severe sepsis without septic shock: Secondary | ICD-10-CM

## 2018-04-07 DIAGNOSIS — A419 Sepsis, unspecified organism: Principal | ICD-10-CM

## 2018-04-07 DIAGNOSIS — T148XXA Other injury of unspecified body region, initial encounter: Secondary | ICD-10-CM

## 2018-04-07 DIAGNOSIS — L089 Local infection of the skin and subcutaneous tissue, unspecified: Secondary | ICD-10-CM

## 2018-04-07 LAB — CBC WITH DIFFERENTIAL/PLATELET
Abs Immature Granulocytes: 0.31 10*3/uL — ABNORMAL HIGH (ref 0.00–0.07)
Basophils Absolute: 0 10*3/uL (ref 0.0–0.1)
Basophils Relative: 0 %
Eosinophils Absolute: 0.5 10*3/uL (ref 0.0–0.5)
Eosinophils Relative: 5 %
HCT: 25.4 % — ABNORMAL LOW (ref 39.0–52.0)
Hemoglobin: 8.5 g/dL — ABNORMAL LOW (ref 13.0–17.0)
Immature Granulocytes: 3 %
LYMPHS ABS: 2.4 10*3/uL (ref 0.7–4.0)
Lymphocytes Relative: 26 %
MCH: 32.6 pg (ref 26.0–34.0)
MCHC: 33.5 g/dL (ref 30.0–36.0)
MCV: 97.3 fL (ref 80.0–100.0)
MONOS PCT: 7 %
Monocytes Absolute: 0.6 10*3/uL (ref 0.1–1.0)
Neutro Abs: 5.2 10*3/uL (ref 1.7–7.7)
Neutrophils Relative %: 59 %
Platelets: 387 10*3/uL (ref 150–400)
RBC: 2.61 MIL/uL — ABNORMAL LOW (ref 4.22–5.81)
RDW: 13 % (ref 11.5–15.5)
WBC: 9.1 10*3/uL (ref 4.0–10.5)
nRBC: 0 % (ref 0.0–0.2)

## 2018-04-07 LAB — MAGNESIUM: Magnesium: 2.1 mg/dL (ref 1.7–2.4)

## 2018-04-07 LAB — CULTURE, BLOOD (ROUTINE X 2): Special Requests: ADEQUATE

## 2018-04-07 LAB — BASIC METABOLIC PANEL
Anion gap: 9 (ref 5–15)
BUN: 13 mg/dL (ref 8–23)
CO2: 18 mmol/L — ABNORMAL LOW (ref 22–32)
Calcium: 7.5 mg/dL — ABNORMAL LOW (ref 8.9–10.3)
Chloride: 114 mmol/L — ABNORMAL HIGH (ref 98–111)
Creatinine, Ser: 1.01 mg/dL (ref 0.61–1.24)
GFR calc Af Amer: >60 mL/min (ref >60)
GFR calc non Af Amer: >60 mL/min (ref >60)
Glucose, Bld: 91 mg/dL (ref 70–99)
Potassium: 4.8 mmol/L (ref 3.5–5.1)
Sodium: 141 mmol/L (ref 135–145)

## 2018-04-07 MED ORDER — SODIUM CHLORIDE 0.9 % IV SOLN
3.0000 g | Freq: Four times a day (QID) | INTRAVENOUS | Status: DC
  Administered 2018-04-07 – 2018-04-09 (×9): 3 g via INTRAVENOUS
  Filled 2018-04-07 (×12): qty 3

## 2018-04-07 NOTE — Care Management Important Message (Signed)
Important Message  Patient Details  Name: Mark Mathews MRN: [id number] Date of Birth: Aug 30, 1940   Medicare Important Message Given:  Yes    Kline Bulthuis Stefan Church 04/07/2018, 8:40 AM

## 2018-04-07 NOTE — Progress Notes (Signed)
[place]: [phone]  Cristobal Goldmannadia Fread Kottke LCSW [phone]

## 2018-04-07 NOTE — Progress Notes (Signed)
Physical Therapy Wound Treatment Patient Details  Name: Mark Mathews MRN: [id number] Date of Birth: 1940-10-16  Today's Date: 04/07/2018 Time: 1013-1038 Time Calculation (min): 25 min  Subjective  Subjective: "All over," pt stated when asked where he hurt Patient and Family Stated Goals: not stated Date of Onset: (unknown) Prior Treatments: Surgical I&D, dressing changes  Pain Score:  Premedicated. Pt reports hurting all over. Repositioned.  Wound Assessment  Pressure Injury 04/02/18 Unstageable - Full thickness tissue loss in which the base of the ulcer is covered by slough (yellow, tan, gray, green or brown) and/or eschar (tan, brown or black) in the wound bed. 14x8 (Active)  Dressing Type ABD;Barrier Film (skin prep);Gauze (Comment);Moist to moist 04/07/2018 10:47 AM  Dressing Changed;Clean;Dry;Intact 04/07/2018 10:47 AM  Dressing Change Frequency Daily 04/07/2018 10:47 AM  State of Healing Early/partial granulation 04/07/2018 10:47 AM  Site / Wound Assessment Red;Pink;Yellow;Brown 04/07/2018 10:47 AM  % Wound base Red or Granulating 65% 04/07/2018 10:47 AM  % Wound base Yellow/Fibrinous Exudate 10% 04/07/2018 10:47 AM  % Wound base Black/Eschar 15% 04/07/2018 10:47 AM  % Wound base Other/Granulation Tissue (Comment) 10% 04/07/2018 10:47 AM  Peri-wound Assessment Erythema (blanchable) 04/07/2018 10:47 AM  Wound Length (cm) 15 cm 04/06/2018 10:57 AM  Wound Width (cm) 11 cm 04/06/2018 10:57 AM  Wound Depth (cm) 3.8 cm 04/06/2018 10:57 AM  Wound Surface Area (cm^2) 165 cm^2 04/06/2018 10:57 AM  Wound Volume (cm^3) 627 cm^3 04/06/2018 10:57 AM  Undermining (cm) 1-2 cm from 6-9 oclock 04/06/2018 10:57 AM  Margins Unattached edges (unapproximated) 04/07/2018 10:47 AM  Drainage Amount Copious 04/07/2018 10:47 AM  Drainage Description Serous 04/07/2018 10:47 AM  Treatment Debridement (Selective);Hydrotherapy (Pulse lavage);Packing (Saline gauze) 04/07/2018 10:47 AM   Hydrotherapy Pulsed lavage therapy - wound location:  sacrum Pulsed Lavage with Suction (psi): 8 psi(8-12) Pulsed Lavage with Suction - Normal Saline Used: 1000 mL Pulsed Lavage Tip: Tip with splash shield Selective Debridement Selective Debridement - Location: sacrum Selective Debridement - Tools Used: Forceps;Scissors Selective Debridement - Tissue Removed: yellow and brown necrotic tissue   Wound Assessment and Plan  Wound Therapy - Assess/Plan/Recommendations Wound Therapy - Clinical Statement: Continue to progress with removal of nonviable tissue.  Wound Therapy - Functional Problem List: Decr mobility Factors Delaying/Impairing Wound Healing: Immobility;Multiple medical problems;Polypharmacy Hydrotherapy Plan: Debridement;Dressing change;Patient/family education;Pulsatile lavage with suction Wound Therapy - Frequency: 6X / week Wound Therapy - Follow Up Recommendations: Skilled nursing facility Wound Plan: See above  Wound Therapy Goals- Improve the function of patient's integumentary system by progressing the wound(s) through the phases of wound healing (inflammation - proliferation - remodeling) by: Decrease Necrotic Tissue to: 20 Decrease Necrotic Tissue - Progress: Progressing toward goal Increase Granulation Tissue to: 80 Increase Granulation Tissue - Progress: Progressing toward goal  Goals will be updated until maximal potential achieved or discharge criteria met.  Discharge criteria: when goals achieved, discharge from hospital, MD decision/surgical intervention, no progress towards goals, refusal/missing three consecutive treatments without notification or medical reason.  GP     [place] 04/07/2018, 10:53 AM [place] Pager [phone] Office [phone]

## 2018-04-07 NOTE — Progress Notes (Signed)
Physical Therapy Treatment Patient Details Name: Mark Mathews MRN: [id number] DOB: [birth year] Today's Date: 04/07/2018    History of Present Illness Mark Mathews is a 78 y.o. male with medical history significant of Parkinson's disease, depression being brought into the hospital by family due to concern for a sacral wound.  Wife at bedside states patient has had a sacral wound for the past 10 days which has been getting progressively worse.  States patient is bedridden at baseline.  He has been more lethargic recently and sleeping all day    PT Comments     Pt performed rolling, supine to sit and sit to supine with total assistance +2.  Pt with increased pain and deferred stretching.  Focus of tx on seated balance at edge of bed.  Plan for SNF remains appropriate to improve strength and function.  Plan next session for continued strengthening of B UE/LEs.    Follow Up Recommendations  SNF;Supervision/Assistance - 24 hour(may need pallative or hospice services)     Equipment Recommendations  Other (comment)    Recommendations for Other Services       Precautions / Restrictions Precautions Precautions: Fall Precaution Comments: contracture Restrictions Weight Bearing Restrictions: No    Mobility  Bed Mobility Overal bed mobility: Needs Assistance Bed Mobility: Rolling;Sidelying to Sit;Sit to Sidelying Rolling: Total assist Sidelying to sit: Total assist;+2 for physical assistance     Sit to sidelying: Total assist;+2 for physical assistance General bed mobility comments: Pt performed rolling from L sidelying to R into midline to adjust pad under patient.  Pt is slow and guarded and griping to hand rail in fear.  He was able to come to sitting and felt supported holding to PTA's hands in sitting.  Once in sitting presents with kyphotic presentation and innability to gaze forward.  Pt performed active assisted reaching with LUE before attempting to return to sidelying.     Transfers                    Ambulation/Gait                 Stairs             Wheelchair Mobility    Modified Rankin (Stroke Patients Only)       Balance     Sitting balance-Leahy Scale: Poor Sitting balance - Comments: moderate list left and need of UE's to sit with min guard for 5 minutes.  Pt required moderate assistance at first as he was leaning posterior and to the L side.                                      Cognition Arousal/Alertness: Lethargic;Awake/alert Behavior During Therapy: Flat affect;WFL for tasks assessed/performed Overall Cognitive Status: Difficult to assess                                 General Comments: Pt reports he is in [place], but does not recall being in the hospital or why he is here.       Exercises      General Comments General comments (skin integrity, edema, etc.): Once returned to sidelying positioned and propped to protect skin and remove pressure.        Pertinent Vitals/Pain Pain Assessment: Faces Faces Pain Scale: Hurts a little bit  Pain Location: Bottom Pain Intervention(s): Monitored during session;Repositioned    Home Living                      Prior Function            PT Goals (current goals can now be found in the care plan section) Acute Rehab PT Goals Patient Stated Goal: pt unable: wife wants to get rid of infection then move to next step in the rehab process. Potential to Achieve Goals: Fair Additional Goals Additional Goal #1: Manage contracture so pt can attain extention of bil LE -5* or less. Progress towards PT goals: Progressing toward goals    Frequency    Min 2X/week      PT Plan Current plan remains appropriate    Co-evaluation              AM-PAC PT "6 Clicks" Mobility   Outcome Measure  Help needed turning from your back to your side while in a flat bed without using bedrails?: Total Help needed moving from  lying on your back to sitting on the side of a flat bed without using bedrails?: Total Help needed moving to and from a bed to a chair (including a wheelchair)?: Total Help needed standing up from a chair using your arms (e.g., wheelchair or bedside chair)?: Total Help needed to walk in hospital room?: Total Help needed climbing 3-5 steps with a railing? : Total 6 Click Score: 6    End of Session Equipment Utilized During Treatment: Gait belt Activity Tolerance: Patient tolerated treatment well Patient left: in bed;with call bell/phone within reach;with family/visitor present;with nursing/sitter in room Nurse Communication: Mobility status PT Visit Diagnosis: Other abnormalities of gait and mobility (R26.89);Adult, failure to thrive (R62.7);Pain Pain - part of body: (sacrum)     Time: 3893-7342 PT Time Calculation (min) (ACUTE ONLY): 20 min  Charges:  $Therapeutic Activity: 8-22 mins                     Joycelyn Rua, PTA Acute Rehabilitation Services Pager [phone] Office [phone]     Ruqayyah Lute Artis Delay 04/07/2018, 12:21 PM

## 2018-04-07 NOTE — Clinical Social Work Note (Signed)
Clinical Social Work Assessment  Patient Details  Name: Mark Mathews MRN: [id number] Date of Birth: Jun 26, 1940  Date of referral:  04/07/18               Reason for consult:  Facility Placement                Permission sought to share information with:  Facility Medical sales representativeContact Representative, Family Supports Permission granted to share information::     Name::     Mark Mathews  Agency::  SNFs  Relationship::  Spouse  Contact Information:  [phone]  Housing/Transportation Living arrangements for the past 2 months:  Single Family Home Source of Information:  Spouse Patient Interpreter Needed:  None Criminal Activity/Legal Involvement Pertinent to Current Situation/Hospitalization:  No - Comment as needed Significant Relationships:  Spouse, Mark Children Lives with:  Spouse Do you feel safe going back to the place where you live?  No Need for family participation in patient care:  Yes (Comment)  Care giving concerns:  CSW received consult for possible SNF placement at time of discharge. CSW spoke with patient's spouse at bedside regarding PT recommendation of SNF placement at time of discharge. Patient's spouse reported that patient's spouse is currently unable to care for patient at their home given patient's current physical needs and fall risk. Patient's spouse expressed understanding of PT recommendation and is agreeable to SNF placement at time of discharge. CSW to continue to follow and assist with discharge planning needs.   Social Worker assessment / plan:  CSW spoke with patient's spouse [facility].  Employment status:  Retired Database administratornsurance information:  Managed Medicare PT Recommendations:  Skilled Nursing Facility Information / Referral to community resources:  Skilled Nursing Facility  Patient/Family's Response to care:  Patient's spouse recognizes need for rehab before returning home and is agree[place]. CSW provided SNF ratings list. She selected [facility] since she has friends with positive experience there ([facility] and [facility] were not able to accept patient). [facility] will start insurance authorization process. Patient's wife aware that if insurance denies patient, it will be an out-of-pocket cost. She requests Palliative to follow at the SNF in case she decides to bring in Hospice.   Patient/Family's Understanding of and Emotional Response to Diagnosis, Current Treatment, and Prognosis:  Patient/family is realistic regarding therapy needs and expressed being hopeful for SNF placement. Patient's spouse expressed understanding of CSW role and discharge process as well as medical condition. No questions/concerns about plan or treatment.    Emotional Assessment Appearance:  Appears stated age Attitude/Demeanor/Rapport:  Unable to Assess Affect (typically observed):  Unable to Assess Orientation:  Oriented to Self, Oriented to Place Alcohol / Substance use:  Not Applicable Psych involvement (Current and /or in the community):  No (Comment)  Discharge Needs  Concerns to be addressed:  Care Coordination Readmission within the last 30 days:  No Current discharge risk:  None Barriers to Discharge:  Continued Medical Work up   Mark [employer] S Mark Beil, LCSW 04/07/2018, 3:34 PM

## 2018-04-07 NOTE — NC FL2 (Signed)
[place] MEDICAID FL2 LEVEL OF CARE SCREENING TOOL     IDENTIFICATION  Patient Name: Mark Mathews Birthdate: [birth year] Sex: male Admission Date (Current Location): 04/01/2018  [facility] and [place] Number:  Producer, television/film/video and Address:  [facility], [address], [place], [place] [address]      Provider Number: [id number]  Attending Physician Name and Address:  Glade Lloyd, MD  Relative Name and Phone Number:  Lanora Manis, spouse, [phone]    Current Level of Care: Hospital Recommended Level of Care: Skilled Nursing Facility Prior Approval Number:    Date Approved/Denied:   PASRR Number: Pending  Discharge Plan: SNF    Current Diagnoses: Patient Active Problem List   Diagnosis Date Noted  . Malnutrition of moderate degree 04/06/2018  . Pressure injury of skin 04/06/2018  . Severe sepsis (HCC) 04/02/2018  . Sacral wound 04/02/2018  . Abdominal tenderness 04/02/2018  . Chronic anemia 04/02/2018  . Goals of care, counseling/discussion   . Palliative care encounter   . Closed compression fracture of L3 lumbar vertebra 04/09/2016  . Parkinson's disease (HCC) 10/12/2012    Orientation RESPIRATION BLADDER Height & Weight     Self, Place  Normal Incontinent, External catheter Weight: 72.5 kg Height:  5\' 6"  (167.6 cm)  BEHAVIORAL SYMPTOMS/MOOD NEUROLOGICAL BOWEL NUTRITION STATUS      Incontinent Diet(Please see DC Summary)  AMBULATORY STATUS COMMUNICATION OF NEEDS Skin   Extensive Assist Verbally PU Stage and Appropriate Care(Unstageable on sacrum; Stage II on tibia)                       Personal Care Assistance Level of Assistance  Bathing, Feeding, Dressing Bathing Assistance: Maximum assistance Feeding assistance: Maximum assistance Dressing Assistance: Maximum assistance     Functional Limitations Info  Sight, Hearing, Speech Sight Info: Adequate Hearing Info: Adequate Speech Info: Adequate    SPECIAL CARE  FACTORS FREQUENCY  PT (By licensed PT), OT (By licensed OT)     PT Frequency: 5x/week OT Frequency: 3x/week            Contractures Contractures Info: Not present    Additional Factors Info  Code Status, Allergies, Psychotropic Code Status Info: DNR Allergies Info: NKA Psychotropic Info: Sinemet          Current Medications (04/07/2018):  This is the current hospital active medication list Current Facility-Administered Medications  Medication Dose Route Frequency Provider Last Rate Last Dose  . 0.9 %  sodium chloride infusion   Intravenous PRN Glade Lloyd, MD   Stopped at 04/06/18 0344  . acetaminophen (TYLENOL) tablet 1,000 mg  1,000 mg Oral TID Ulice Bold, NP   1,000 mg at 04/07/18 1144  . Ampicillin-Sulbactam (UNASYN) 3 g in sodium chloride 0.9 % 100 mL IVPB  3 g Intravenous Q6H Alekh, Kshitiz, MD 200 mL/hr at 04/07/18 1210 3 g at 04/07/18 1210  . carbidopa-levodopa (SINEMET IR) 25-100 MG per tablet immediate release 1.5 tablet  1.5 tablet Oral 5 X Daily Glade Lloyd, MD   1.5 tablet at 04/07/18 1209  . enoxaparin (LOVENOX) injection 40 mg  40 mg Subcutaneous Q24H Alekh, Kshitiz, MD   40 mg at 04/07/18 1145  . entacapone (COMTAN) tablet 200 mg  200 mg Oral 5 X Daily Hanley Ben, Kshitiz, MD   200 mg at 04/07/18 1209  . feeding supplement (ENSURE ENLIVE) (ENSURE ENLIVE) liquid 237 mL  237 mL Oral BID BM Hanley Ben, Kshitiz, MD   237 mL  at 04/07/18 1145  . feeding supplement (PRO-STAT SUGAR FREE 64) liquid 30 mL  30 mL Oral BID Hanley BenAlekh, Kshitiz, MD   30 mL at 04/07/18 1144  . morphine 2 MG/ML injection 1 mg  1 mg Intravenous Q3H PRN Andria MeuseWhite, Christopher M, MD   1 mg at 04/07/18 1145  . multivitamin with minerals tablet 1 tablet  1 tablet Oral Daily Glade LloydAlekh, Kshitiz, MD   1 tablet at 04/07/18 1145  . oxyCODONE (Oxy IR/ROXICODONE) immediate release tablet 5 mg  5 mg Oral Q4H PRN Glade LloydAlekh, Kshitiz, MD   5 mg at 04/07/18 0958  . trihexyphenidyl (ARTANE) tablet 1 mg  1 mg Oral TID WC Glade LloydAlekh,  Kshitiz, MD   1 mg at 04/07/18 0805     Discharge Medications: Please see discharge summary for a list of discharge medications.  Relevant Imaging Results:  Relevant Lab Results:   Additional Information SSN: [id number]  [facility]  Kenni Newton S Mahin Guardia, LCSW

## 2018-04-07 NOTE — Progress Notes (Signed)
Daily Progress Note   Patient Name: Mark Mathews       Date: 04/07/2018 DOB: [birth year]  Age: 78 y.o. MRN#: [id number] Attending Physician: Glade Lloyd, MD Primary Care Physician: Maurice Small, MD Admit Date: 04/01/2018  Reason for Consultation/Follow-up: Establishing goals of care  Subjective:  Patient awake, grabbing at side rail and tells me he feels like he is falling. Also demanding coffee. Hydrotherapy recently completed by PT.   GOC:  Palliative f/u with wife Mark Mathews) in conference room.   I introduced Palliative Medicine as specialized medical care for people living with serious illness. It focuses on providing relief from the symptoms and stress of a serious illness. The goal is to improve quality of life for both the patient and the family.  Discussed baseline prior to admission, including worsening functional, cognitive, and nutritional status in the last 3-4 weeks at home. Wife is primary caregiver. Supportive son who checks in daily after 5pm.    Discussed events leading up to admission and course of hospitalization. Discussed her conversation with palliative NP Helmut Muster) and other members of the care team. Lanora Manis became tearful speaking of her fear that he may not return home. She speaks of interest in hospice services but inability to take him home and care for him with higher level of care.   Discussed progressive, irreversible nature of Parkinson's and now with possible dementia component. Discussed challenges with wound healing with chronic conditions, poor functional status, poor nutritional status, and low albumin. Discussed high risk for decline/re-hospitalization with underlying Parkinson's/dementia and wound, nutrition/hydration status, risk for  aspiration leading to pneumonia, etc.   I attempted to elicit values and goals of care important to the patient. Advanced directives, concepts specific to code status, artifical feeding and hydration, and rehospitalization were considered and discussed. Reviewed patients living will with wife. He has documented HCPOA's but did not initial regarding EOL wishes. She does not know his stance on artificial nutrition. Her and son have been considering feeding tube but she also seems to understand this may not be a good option. Educated on medical recommendation against feeding tube with fear he would remove it with ongoing confusion/agitation and also other risks for infection/aspiration.   Wife prefers to avoid feeding tube if possible, understanding it will likely not improve his quality of life with  progressive Parkinson's. We discussed comfort feeds and that it will be normal for appetite to wax/wane with progressive Parkinson's/dementia. Educated on EOL expectations.   The difference between aggressive medical intervention and comfort care was considered. Mrs. Tiburcio PeaHarris states "I don't want him to die but I don't want him to be in pain either." She shares her hope for discharge to SNF to attempt rehab and possibly get him to a point of ambulating again (in order to return home). She does acknowledge that it would be very difficult to care for him at home even if hospice is involved.    Discussed SNF to attempt rehab with palliative follow-up. Discussed hospice options and philosophy if he does not progress with therapy including hospice facility if he is appropriate. Wife agreeable with this plan.   Introduced and discussed MOST form. Wife wishes to speak with her son about this but considering completing in the near future. She confirms DNR code status.   Questions and concerns were addressed.  Hard Choices booklet left for review. PMT contact information given.   Length of Stay: 5  Current  Medications: Scheduled Meds:  . acetaminophen  1,000 mg Oral TID  . carbidopa-levodopa  1.5 tablet Oral 5 X Daily  . enoxaparin (LOVENOX) injection  40 mg Subcutaneous Q24H  . entacapone  200 mg Oral 5 X Daily  . feeding supplement (ENSURE ENLIVE)  237 mL Oral BID BM  . feeding supplement (PRO-STAT SUGAR FREE 64)  30 mL Oral BID  . multivitamin with minerals  1 tablet Oral Daily  . trihexyphenidyl  1 mg Oral TID WC    Continuous Infusions: . sodium chloride Stopped (04/06/18 0344)  . ampicillin-sulbactam (UNASYN) IV 3 g (04/07/18 1210)    PRN Meds: sodium chloride, morphine injection, oxyCODONE  Physical Exam Vitals signs and nursing note reviewed.  Constitutional:      General: He is awake.     Appearance: He is cachectic. He is ill-appearing.  HENT:     Head: Normocephalic and atraumatic.  Pulmonary:     Effort: No tachypnea, accessory muscle usage or respiratory distress.  Abdominal:     Tenderness: There is no abdominal tenderness.  Skin:    General: Skin is warm and dry.     Coloration: Skin is pale.     Comments: Sacral wound  Neurological:     Mental Status: He is alert. He is disoriented.  Psychiatric:        Attention and Perception: He is inattentive.        Speech: Speech is delayed.        Cognition and Memory: Cognition is impaired.            Vital Signs: BP (!) 100/57 (BP Location: Left Arm)   Pulse 60   Temp (!) 97.5 F (36.4 C) (Oral)   Resp 16   Ht 5\' 6"  (1.676 m)   Wt 72.5 kg   SpO2 100%   BMI 25.81 kg/m  SpO2: SpO2: 100 % O2 Device: O2 Device: Room Air O2 Flow Rate:    Intake/output summary:   Intake/Output Summary (Last 24 hours) at 04/07/2018 1226 Last data filed at 04/07/2018 [id number] Gross per 24 hour  Intake 520 ml  Output 600 ml  Net -80 ml   LBM: Last BM Date: 04/05/18 Baseline Weight: Weight: 70.3 kg Most recent weight: Weight: 72.5 kg       Palliative Assessment/Data: PPS 30%   Flowsheet Rows     Most Recent  Value    Intake Tab  Referral Department  Hospitalist  Unit at Time of Referral  Intermediate Care Unit  Palliative Care Primary Diagnosis  Other (Comment) [infected sacral wound]  Date Notified  04/02/18  Palliative Care Type  New Palliative care  Reason for referral  Clarify Goals of Care  Date of Admission  04/01/18  # of days IP prior to Palliative referral  1  Clinical Assessment  Psychosocial & Spiritual Assessment  Palliative Care Outcomes      Patient Active Problem List   Diagnosis Date Noted  . Malnutrition of moderate degree 04/06/2018  . Pressure injury of skin 04/06/2018  . Severe sepsis (HCC) 04/02/2018  . Sacral wound 04/02/2018  . Abdominal tenderness 04/02/2018  . Chronic anemia 04/02/2018  . Goals of care, counseling/discussion   . Palliative care encounter   . Closed compression fracture of L3 lumbar vertebra 04/09/2016  . Parkinson's disease (HCC) 10/12/2012    Palliative Care Assessment & Plan   Patient Profile: 78 y.o. male  with past medical history of advanced Parkinson's disease, spinal stenosis admitted on 04/01/2018 with sacral wound causing sepsis and requiring surgical debridement 04/02/18.   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 Wife prefers to avoid PEG tube placement at this time, understanding this will likely not impact his quality of life. Encouraged comfort feeds and reviewed recommendations from registered dietician.   After further GOC discussion, wife wishes to pursue SNF placement to attempt rehab. She is agreeable to outpatient palliative follow-up and understands he will be eligible for hospice  services if he does not progress at rehab. SW consulted.   Continue prn medications for pain management.   Code Status: DNR   Code Status Orders  (From admission, onward)         Start     Ordered   04/02/18 0018  Do not attempt resuscitation (DNR)  Continuous    Question Answer Comment  In the event of cardiac or respiratory ARREST Do not call a "code blue"   In the event of cardiac or respiratory ARREST Do not perform Intubation, CPR, defibrillation or ACLS   In the event of cardiac or respiratory ARREST Use medication by any route, position, wound care, and other measures to relive pain and suffering. May use oxygen, suction and manual treatment of airway obstruction as needed for comfort.      04/02/18 0021        Code Status History    This patient has a current code status but no historical code status.       Prognosis:   Poor long-term prognosis with progressive Parkinson's disease, sacral decubitus, declining cognitive/functional/nutritional status.  Discharge Planning:  Skilled Nursing Facility for rehab with Palliative care service follow-up  Care plan was discussed with patient, wife, RN, Dr. Hanley BenAlekh  Thank you for allowing the Palliative Medicine Team to assist in the care of this patient.   Time In: 1010 Time Out: 1120 Total Time 70 Prolonged Time Billed yes       Greater than 50%  of this time was spent counseling and coordinating care related to the above assessment and plan.  Vennie HomansMegan Cornelia Walraven, FNP-C Palliative Medicine Team  Phone: [phone] Fax: [phone]  Please contact Palliative Medicine Team phone at  [phone] for questions and concerns.

## 2018-04-07 NOTE — Progress Notes (Signed)
Patient ID: Mark Mathews, male   DOB: [birth year], 78 y.o.   MRN: [id number]  PROGRESS NOTE    Mark Mathews  NWG:[id number] DOB: 1940-04-17 DOA: 04/01/2018 PCP: Maurice Small, MD   Brief Narrative:  78 year old male with history of Parkinson's disease, depression presented with sacral wound.  He was admitted with infected sacral wound and started on broad-spectrum antibiotics.  General surgery was consulted.  He underwent debridement on 04/02/2018 by general surgery.  Palliative care was also consulted.   Assessment & Plan:   Principal Problem:   Severe sepsis (HCC) Active Problems:   Sacral wound   Abdominal tenderness   Chronic anemia   Goals of care, counseling/discussion   Palliative care encounter   Malnutrition of moderate degree   Pressure injury of skin   Severe sepsis secondary to infected sacral wound -Continue broad-spectrum antibiotics.  Blood pressure improving but still intermittently on the lower side.  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  Currently on broad-spectrum antibiotics for now.  Wound care as per general surgery recommendations -Spoke to general surgery and they are okay for antibiotics to be discontinued.  They do not think that patient needs more antibiotics.  Gram-negative rod bacteremia -Questionable cause; most likely from the infected sacral ulcer.  LFTs normal, no signs of UTI.  Gram-negative rod has still not been identified yet.  We will continue Zosyn for now.  Discontinue vancomycin. -No temperature spikes.  Leukocytosis -From above.  Resolved.  Hypokalemia -Improved.  Chronic anemia -Stable.  No signs of bleeding.  Monitor.  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  He was  currently bedbound at home for 3 to 4 weeks with some behavioral disturbances. -Continue carbidopa-levodopa, entacapone and trihexyphenidyl. -PT recommends SNF placement.  Consult social worker -Monitor mental status.  Fall precautions.  Palliative care following.  -Since his overall mental status is worsening pretty fast, recommend focusing more on comfort and consider hospice.  Overall prognosis is guarded to poor.   Hypoalbuminemia/moderate malnutrition -Secondary to very poor oral intake.  Follow nutrition recommendations.  DVT prophylaxis: Lovenox Code Status: DNR Family Communication: Spoke to wife on phone on 04/05/2018.  None at bedside this morning disposition Plan: SNF versus residential hospice if family agreeable in 1 to 3 days  Consultants: General surgery/palliative care  Procedures: Sacral wound debridement on 04/02/2018 by general surgery  Antimicrobials: Vancomycin and Zosyn from 04/01/2018 onwards   Subjective: Patient seen and examined at bedside.  He is more awake this morning.  Does not answer much questions.  No overnight fever, no vomiting, agitation reported by nursing staff.  Objective: Vitals:   04/06/18 0500 04/06/18 1440 04/06/18 2249 04/07/18 0623  BP: (!) 95/49 (!) 113/52 (!) 100/47 (!) 100/57  Pulse: 60 93 72 60  Resp: 20  20 16   Temp: 97.7 F (36.5 C) 98.9 F (37.2 C) 98.2 F (36.8 C) (!) 97.5 F (36.4 C)  TempSrc: Oral Oral Oral Oral  SpO2: 98%  100% 100%  Weight: 72.5 kg     Height:        Intake/Output Summary (Last 24 hours) at 04/07/2018 0953 Last data filed at 04/07/2018 5784 Gross per 24 hour  Intake 520 ml  Output 600 ml  Net -80 ml   [employer]  04/01/18 2132 04/02/18 0229 04/06/18 0500  Weight: 70.3 kg 63.9 kg 72.5 kg    Examination:  General exam: Elderly male, chronically ill looking.  Lying in bed.  Awake, answers very minimal questions.  No acute distress  respiratory system: Bilateral decreased breath sounds at bases,  some scattered crackles.  No wheezing Cardiovascular system: S1 & S2 heard, rate controlled  gastrointestinal system: Abdomen is nondistended, soft and nontender. Normal bowel sounds heard. Extremities: No cyanosis, edema     Data Reviewed: I have personally reviewed following labs and imaging studies  CBC: Recent Labs  Lab 04/03/18 0900 04/04/18 0051 04/05/18 0449 04/06/18 0618 04/07/18 0351  WBC 11.0* 10.5 9.5 13.1* 9.1  NEUTROABS 9.0* 7.9* 6.7 9.2* 5.2  HGB 7.6* 8.1* 7.9* 8.8* 8.5*  HCT 22.5* 24.0* 24.5* 27.8* 25.4*  MCV 96.2 96.0 96.1 97.9 97.3  PLT 234 270 297 390 387   Basic Metabolic Panel: Recent Labs  Lab 04/03/18 0900 04/04/18 0051 04/05/18 0449 04/06/18 0618 04/07/18 0351  NA 143 143 141 143 141  K 2.7* 3.5 3.0* 3.4* 4.8  CL 113* 114* 111 114* 114*  CO2 22 22 21* 22 18*  GLUCOSE 111* 122* 97 99 91  BUN 15 17 14 15 13   CREATININE 0.94 0.99 0.99 1.09 1.01  CALCIUM 7.4* 7.6* 7.4* 7.4* 7.5*  MG 2.0 2.1 2.0 2.0 2.1   GFR: Estimated Creatinine Clearance: 54.4 mL/min (by C-G formula based on SCr of 1.01 mg/dL). Liver Function Tests: Recent Labs  Lab 04/01/18 2132 04/06/18 0618  AST 44* 38  ALT 9 13  ALKPHOS 53 43  BILITOT 0.8 1.1  PROT 6.6 5.5*  ALBUMIN 2.4* 1.9*   No results for input(s): LIPASE, AMYLASE in the last 168 hours. No results for input(s): AMMONIA in the last 168 hours. Coagulation Profile: No results for input(s): INR, PROTIME in the last 168 hours. Cardiac Enzymes: No results for input(s): CKTOTAL, CKMB, CKMBINDEX, TROPONINI in the last 168 hours. BNP (last 3 results) No results for input(s): PROBNP in the last 8760 hours. HbA1C: No results for input(s): HGBA1C in the last 72 hours. CBG: Recent Labs  Lab 04/04/18 1941 04/05/18 1313  GLUCAP 118* 96   Lipid Profile: No results for input(s): CHOL, HDL, LDLCALC, TRIG, CHOLHDL, LDLDIRECT in the last 72 hours. Thyroid Function Tests: No results for input(s): TSH, T4TOTAL,  FREET4, T3FREE, THYROIDAB in the last 72 hours. Anemia Panel: No results for input(s): VITAMINB12, FOLATE, FERRITIN, TIBC, IRON, RETICCTPCT in the last 72 hours. Sepsis Labs: Recent Labs  Lab 04/01/18 2218 04/01/18 2338 04/02/18 0335  LATICACIDVEN 2.2* 2.3* 1.3    Recent Results (from the past 240 hour(s))  Blood Culture (routine x 2)     Status: None (Preliminary result)   Collection Time: 04/01/18  9:35 PM  Result Value Ref Range Status   Specimen Description SITE NOT SPECIFIED  Final   Special Requests   Final    BOTTLES DRAWN AEROBIC AND ANAEROBIC Blood Culture adequate volume   Culture  Setup Time   Final    GRAM NEGATIVE RODS ANAEROBIC BOTTLE ONLY CRITICAL RESULT CALLED TO, READ BACK BY AND VERIFIED WITH: PHARMD J LEDFORD [phone] AT 725 AM BY CM Performed at [facility], [address] [place], [place] [address]    Culture   Final    CULTURE REINCUBATED FOR BETTER GROWTH CORRECTED ON 02/03 AT 1317: PREVIOUSLY REPORTED AS NO GROWTH 5 DAYS   Report Status PENDING  Incomplete  Blood  Culture ID Panel (Reflexed)     Status: None   Collection Time: 04/01/18  9:35 PM  Result Value Ref Range Status   Enterococcus species NOT DETECTED NOT DETECTED Final   Listeria monocytogenes NOT DETECTED NOT DETECTED Final   Staphylococcus species NOT DETECTED NOT DETECTED Final   Staphylococcus aureus (BCID) NOT DETECTED NOT DETECTED Final   Streptococcus species NOT DETECTED NOT DETECTED Final   Streptococcus agalactiae NOT DETECTED NOT DETECTED Final   Streptococcus pneumoniae NOT DETECTED NOT DETECTED Final   Streptococcus pyogenes NOT DETECTED NOT DETECTED Final   Acinetobacter baumannii NOT DETECTED NOT DETECTED Final   Enterobacteriaceae species NOT DETECTED NOT DETECTED Final   Enterobacter cloacae complex NOT DETECTED NOT DETECTED Final   Escherichia coli NOT DETECTED NOT DETECTED Final   Klebsiella oxytoca NOT DETECTED NOT DETECTED Final   Klebsiella pneumoniae NOT  DETECTED NOT DETECTED Final   Proteus species NOT DETECTED NOT DETECTED Final   Serratia marcescens NOT DETECTED NOT DETECTED Final   Haemophilus influenzae NOT DETECTED NOT DETECTED Final   Neisseria meningitidis NOT DETECTED NOT DETECTED Final   Pseudomonas aeruginosa NOT DETECTED NOT DETECTED Final   Candida albicans NOT DETECTED NOT DETECTED Final   Candida glabrata NOT DETECTED NOT DETECTED Final   Candida krusei NOT DETECTED NOT DETECTED Final   Candida parapsilosis NOT DETECTED NOT DETECTED Final   Candida tropicalis NOT DETECTED NOT DETECTED Final    Comment: Performed at [facility], [address], [place], [place] [address]  Blood Culture (routine x 2)     Status: None   Collection Time: 04/01/18 10:16 PM  Result Value Ref Range Status   Specimen Description BLOOD LEFT HAND  Final   Special Requests   Final    BOTTLES DRAWN AEROBIC AND ANAEROBIC Blood Culture adequate volume   Culture   Final    NO GROWTH 5 DAYS Performed at [facility], [address] [place], [place] [address]    Report Status 04/06/2018 FINAL  Final  MRSA PCR Screening     Status: None   Collection Time: 04/02/18  4:31 AM  Result Value Ref Range Status   MRSA by PCR NEGATIVE NEGATIVE Final    Comment:        The GeneXpert MRSA Assay (FDA approved for NASAL specimens only), is one component of a comprehensive MRSA colonization surveillance program. It is not intended to diagnose MRSA infection nor to guide or monitor treatment for MRSA infections. Performed at [facility], [address] [place], [place] [address]          Radiology Studies: No results found.      Scheduled Meds: . acetaminophen  1,000 mg Oral TID  . carbidopa-levodopa  1.5 tablet Oral 5 X Daily  . enoxaparin (LOVENOX) injection  40 mg Subcutaneous Q24H  . entacapone  200 mg Oral 5 X Daily  . feeding supplement (ENSURE ENLIVE)  237 mL Oral BID BM  . feeding supplement (PRO-STAT SUGAR FREE  64)  30 mL Oral BID  . multivitamin with minerals  1 tablet Oral Daily  . trihexyphenidyl  1 mg Oral TID WC   Continuous Infusions: . sodium chloride Stopped (04/06/18 0344)  . piperacillin-tazobactam (ZOSYN)  IV 3.375 g (04/07/18 0449)  . vancomycin 1,500 mg (04/06/18 2016)     LOS: 5 days        Glade Lloyd, MD [facility] Pager [phone]  If 7PM-7AM, please contact night-coverage [email or web address] Password  [email or web address] 04/07/2018, 9:53 AM

## 2018-04-07 NOTE — Progress Notes (Signed)
Pharmacy Antibiotic Note  Mark Mathews is a 78 y.o. male admitted on 04/01/2018 with concern for sepsis resulting from developing sacral wound.   Lab called with update on blood cultures today. It was positive for bacteriodes fragilis which is an anaerobes. These are almost never a contaminant in the blood. D/w Dr. Hanley Ben, we will optimize abx to Unasyn.   Plan: Unasyn 3g IV q6  Height: 5\' 6"  (167.6 cm) Weight: 159 lb 14.4 oz (72.5 kg) IBW/kg (Calculated) : 63.8  Temp (24hrs), Avg:98.2 F (36.8 C), Min:97.5 F (36.4 C), Max:98.9 F (37.2 C)  Recent Labs  Lab 04/01/18 2218 04/01/18 2338 04/02/18 0335  04/03/18 0900 04/04/18 0051 04/04/18 0924 04/05/18 0449 04/06/18 0618 04/07/18 0351  WBC  --   --   --    < > 11.0* 10.5  --  9.5 13.1* 9.1  CREATININE  --   --   --   --  0.94 0.99  --  0.99 1.09 1.01  LATICACIDVEN 2.2* 2.3* 1.3  --   --   --   --   --   --   --   VANCOPEAK  --   --   --   --   --  35  --   --   --   --   VANCORANDOM  --   --   --   --   --   --  14  --   --   --    < > = values in this interval not displayed.    Estimated Creatinine Clearance: 54.4 mL/min (by C-G formula based on SCr of 1.01 mg/dL).    No Known Allergies  [name], PharmD, BCIDP, AAHIVP, CPP Infectious Disease Pharmacist 04/07/2018 10:50 AM

## 2018-04-08 DIAGNOSIS — R7881 Bacteremia: Secondary | ICD-10-CM

## 2018-04-08 LAB — BASIC METABOLIC PANEL
Anion gap: 4 — ABNORMAL LOW (ref 5–15)
BUN: 13 mg/dL (ref 8–23)
CO2: 22 mmol/L (ref 22–32)
Calcium: 7.4 mg/dL — ABNORMAL LOW (ref 8.9–10.3)
Chloride: 113 mmol/L — ABNORMAL HIGH (ref 98–111)
Creatinine, Ser: 1.05 mg/dL (ref 0.61–1.24)
GFR calc Af Amer: >60 mL/min (ref >60)
Glucose, Bld: 106 mg/dL — ABNORMAL HIGH (ref 70–99)
Potassium: 3.7 mmol/L (ref 3.5–5.1)
Sodium: 139 mmol/L (ref 135–145)

## 2018-04-08 LAB — CBC WITH DIFFERENTIAL/PLATELET
Abs Immature Granulocytes: 0.29 10*3/uL — ABNORMAL HIGH (ref 0.00–0.07)
BASOS PCT: 0 %
Basophils Absolute: 0 10*3/uL (ref 0.0–0.1)
Eosinophils Absolute: 0.3 10*3/uL (ref 0.0–0.5)
Eosinophils Relative: 4 %
HCT: 25 % — ABNORMAL LOW (ref 39.0–52.0)
Hemoglobin: 8.1 g/dL — ABNORMAL LOW (ref 13.0–17.0)
IMMATURE GRANULOCYTES: 4 %
Lymphocytes Relative: 24 %
Lymphs Abs: 2 10*3/uL (ref 0.7–4.0)
MCH: 31.2 pg (ref 26.0–34.0)
MCHC: 32.4 g/dL (ref 30.0–36.0)
MCV: 96.2 fL (ref 80.0–100.0)
MONOS PCT: 8 %
Monocytes Absolute: 0.7 10*3/uL (ref 0.1–1.0)
NEUTROS PCT: 60 %
Neutro Abs: 5 10*3/uL (ref 1.7–7.7)
Platelets: 434 10*3/uL — ABNORMAL HIGH (ref 150–400)
RBC: 2.6 MIL/uL — ABNORMAL LOW (ref 4.22–5.81)
RDW: 12.8 % (ref 11.5–15.5)
WBC: 8.3 10*3/uL (ref 4.0–10.5)
nRBC: 0 % (ref 0.0–0.2)

## 2018-04-08 LAB — MAGNESIUM: Magnesium: 2.1 mg/dL (ref 1.7–2.4)

## 2018-04-08 NOTE — Progress Notes (Signed)
Physical Therapy Wound Treatment Patient Details  Name: Mark Mathews MRN: [id number] Date of Birth: 1940/10/31  Today's Date: 04/08/2018 Time: 1030-1100 Time Calculation (min): 30 min  Subjective  Subjective: Pt stated feeling like he needed to urinate (has Foley) Patient and Family Stated Goals: not stated Date of Onset: (unknown) Prior Treatments: Surgical I&D, dressing changes  Pain Score:  Premedicated; pt did not respond when asked where his pain was located  Wound Assessment  Pressure Injury 04/02/18 Unstageable - Full thickness tissue loss in which the base of the ulcer is covered by slough (yellow, tan, gray, green or brown) and/or eschar (tan, brown or black) in the wound bed. 14x8 (Active)  Dressing Type ABD;Barrier Film (skin prep);Gauze (Comment);Moist to moist 04/08/2018 12:11 PM  Dressing Changed;Clean;Dry;Intact 04/08/2018 12:11 PM  Dressing Change Frequency Daily 04/08/2018 12:11 PM  State of Healing Early/partial granulation 04/08/2018 12:11 PM  Site / Wound Assessment Red;Pink;Yellow;Brown 04/08/2018 12:11 PM  % Wound base Red or Granulating 65% 04/08/2018 12:11 PM  % Wound base Yellow/Fibrinous Exudate 10% 04/08/2018 12:11 PM  % Wound base Black/Eschar 15% 04/08/2018 12:11 PM  % Wound base Other/Granulation Tissue (Comment) 10% 04/08/2018 12:11 PM  Peri-wound Assessment Erythema (blanchable) 04/08/2018 12:11 PM  Wound Length (cm) 15 cm 04/06/2018 10:57 AM  Wound Width (cm) 11 cm 04/06/2018 10:57 AM  Wound Depth (cm) 3.8 cm 04/06/2018 10:57 AM  Wound Surface Area (cm^2) 165 cm^2 04/06/2018 10:57 AM  Wound Volume (cm^3) 627 cm^3 04/06/2018 10:57 AM  Undermining (cm) 1-2 cm from 6-9 oclock 04/06/2018 10:57 AM  Margins Unattached edges (unapproximated) 04/08/2018 12:11 PM  Drainage Amount Moderate 04/08/2018 12:11 PM  Drainage Description Serous 04/08/2018 12:11 PM  Treatment Debridement (Selective);Hydrotherapy (Pulse lavage);Packing (Saline gauze) 04/08/2018 12:11 PM      Hydrotherapy Pulsed lavage  therapy - wound location: sacrum Pulsed Lavage with Suction (psi): 8 psi(8-12) Pulsed Lavage with Suction - Normal Saline Used: 1000 mL Pulsed Lavage Tip: Tip with splash shield Selective Debridement Selective Debridement - Location: sacrum Selective Debridement - Tools Used: Forceps;Scissors Selective Debridement - Tissue Removed: minimal yellow and brown necrotic tissue   Wound Assessment and Plan  Wound Therapy - Assess/Plan/Recommendations Wound Therapy - Clinical Statement: Continue to progress with removal of nonviable tissue.  Wound Therapy - Functional Problem List: Decr mobility Factors Delaying/Impairing Wound Healing: Immobility;Multiple medical problems;Polypharmacy Hydrotherapy Plan: Debridement;Dressing change;Patient/family education;Pulsatile lavage with suction Wound Therapy - Frequency: 6X / week Wound Therapy - Follow Up Recommendations: Skilled nursing facility Wound Plan: See above  Wound Therapy Goals- Improve the function of patient's integumentary system by progressing the wound(s) through the phases of wound healing (inflammation - proliferation - remodeling) by: Decrease Necrotic Tissue to: 20 Decrease Necrotic Tissue - Progress: Progressing toward goal Increase Granulation Tissue to: 80 Increase Granulation Tissue - Progress: Progressing toward goal Goals/treatment plan/discharge plan were made with and agreed upon by patient/family: Yes Time For Goal Achievement: 7 days Wound Therapy - Potential for Goals: Fair  Goals will be updated until maximal potential achieved or discharge criteria met.  Discharge criteria: when goals achieved, discharge from hospital, MD decision/surgical intervention, no progress towards goals, refusal/missing three consecutive treatments without notification or medical reason.  GP     Ellamae Sia, PT, DPT Acute Rehabilitation Services Pager [phone] Office [phone]' Willy Eddy 04/08/2018, 12:16 PM

## 2018-04-08 NOTE — Progress Notes (Signed)
PROGRESS NOTE        PATIENT DETAILS Name: Mark Mathews Age: 78 y.o. Sex: male Date of Birth: 11/06/1940 Admit Date: 04/01/2018 Admitting Physician John Giovanni, MD VQM:[id number], Consuella Lose, MD  Brief Narrative: Patient is a 78 y.o. male with history of Parkinson's disease presented with sepsis secondary to infected sacral decubitus ulcer Bacteroides fragilis bacteremia.  See below for further details  Subjective: Appears comfortable-lying comfortably in bed-no chest pain or shortness of breath.  No family at bedside.  Assessment/Plan: Severe sepsis secondary to infected sacral decubitus ulcer and Bacteroides fragilis bacteremia: Sepsis pathophysiology has resolved-remains on IV Unasyn.  General surgery following-underwent debridement on 1/30.  Recommendations are to do wound care with wet-to-dry dressing changes-social worker following-plans are for SNF on discharge.  We will plan to transition to Augmentin on discharge  Parkinson's disease: Continue Sinemet, entacapone and trihexyphenidyl.  Follow with primary neurologist in the outpatient setting.  Failure to thrive syndrome: Very frail-suspect minimally ambulatory and mostly bedbound-poor overall prognosis-palliative care following-DNR in place, plans are for discharge to SNF.  Suspect if patient decompensates in becomes acutely ill in the future-comfort care would be appropriate.  Moderate malnutrition: Continue supplements.  DVT Prophylaxis: Prophylactic Lovenox   Code Status: Full code   Family Communication: None at bedside  Disposition Plan: Remain inpatient-SNF on discharge-likely on 2/6 if bed available  Antimicrobial agents: Anti-infectives (From admission, onward)   Start     Dose/Rate Route Frequency Ordered Stop   04/07/18 1100  Ampicillin-Sulbactam (UNASYN) 3 g in sodium chloride 0.9 % 100 mL IVPB     3 g 200 mL/hr over 30 Minutes Intravenous Every 6 hours 04/07/18 1047     04/02/18 2000  vancomycin (VANCOCIN) 1,500 mg in sodium chloride 0.9 % 500 mL IVPB  Status:  Discontinued     1,500 mg 250 mL/hr over 120 Minutes Intravenous Every 24 hours 04/02/18 0029 04/07/18 1000   04/02/18 0400  piperacillin-tazobactam (ZOSYN) IVPB 3.375 g  Status:  Discontinued     3.375 g 12.5 mL/hr over 240 Minutes Intravenous Every 8 hours 04/02/18 0029 04/07/18 1047   04/01/18 2230  vancomycin (VANCOCIN) 1,500 mg in sodium chloride 0.9 % 500 mL IVPB     1,500 mg 250 mL/hr over 120 Minutes Intravenous  Once 04/01/18 2142 04/02/18 0039   04/01/18 2200  piperacillin-tazobactam (ZOSYN) IVPB 3.375 g     3.375 g 100 mL/hr over 30 Minutes Intravenous  Once 04/01/18 2142 04/01/18 2323      Procedures: 1/30>> sacral wound debridement  CONSULTS:  general surgery and Palliative  Time spent: 25- minutes-Greater than 50% of this time was spent in counseling, explanation of diagnosis, planning of further management, and coordination of care.  MEDICATIONS: Scheduled Meds: . acetaminophen  1,000 mg Oral TID  . carbidopa-levodopa  1.5 tablet Oral 5 X Daily  . enoxaparin (LOVENOX) injection  40 mg Subcutaneous Q24H  . entacapone  200 mg Oral 5 X Daily  . feeding supplement (ENSURE ENLIVE)  237 mL Oral BID BM  . feeding supplement (PRO-STAT SUGAR FREE 64)  30 mL Oral BID  . multivitamin with minerals  1 tablet Oral Daily  . trihexyphenidyl  1 mg Oral TID WC   Continuous Infusions: . sodium chloride Stopped (04/06/18 1303)  . ampicillin-sulbactam (UNASYN) IV 3 g (04/08/18 1258)   PRN Meds:.sodium chloride, morphine injection,  oxyCODONE   PHYSICAL EXAM: Vital signs: Vitals:   04/07/18 0623 04/07/18 1453 04/07/18 2104 04/08/18 0437  BP: (!) 100/57 (!) 109/55 93/61 (!) 125/95  Pulse: 60 70 90 77  Resp: 16  16 19   Temp: (!) 97.5 F (36.4 C) 98.4 F (36.9 C) 98.5 F (36.9 C) 98.4 F (36.9 C)  TempSrc: Oral Oral Oral Oral  SpO2: 100% 100% 99% 100%  Weight:      Height:         Filed Weights   04/01/18 2132 04/02/18 0229 04/06/18 0500  Weight: 70.3 kg 63.9 kg 72.5 kg   Body mass index is 25.81 kg/m.   General appearance :Awake, alert, not in any distress.  Chronically sick appearing. HEENT: Atraumatic and Normocephalic Neck: supple,  Resp:Good air entry bilaterally, no added sounds  CVS: S1 S2 regular GI: Bowel sounds present, Non tender and not distended with no gaurding, rigidity or rebound.No organomegaly Extremities: B/L Lower Ext shows no edema Musculoskeletal:No digital cyanosis Skin:No Rash, warm and dry Wounds:N/A  I have personally reviewed following labs and imaging studies  LABORATORY DATA: CBC: Recent Labs  Lab 04/04/18 0051 04/05/18 0449 04/06/18 0618 04/07/18 0351 04/08/18 0349  WBC 10.5 9.5 13.1* 9.1 8.3  NEUTROABS 7.9* 6.7 9.2* 5.2 5.0  HGB 8.1* 7.9* 8.8* 8.5* 8.1*  HCT 24.0* 24.5* 27.8* 25.4* 25.0*  MCV 96.0 96.1 97.9 97.3 96.2  PLT 270 297 390 387 434*    Basic Metabolic Panel: Recent Labs  Lab 04/04/18 0051 04/05/18 0449 04/06/18 0618 04/07/18 0351 04/08/18 0349  NA 143 141 143 141 139  K 3.5 3.0* 3.4* 4.8 3.7  CL 114* 111 114* 114* 113*  CO2 22 21* 22 18* 22  GLUCOSE 122* 97 99 91 106*  BUN 17 14 15 13 13   CREATININE 0.99 0.99 1.09 1.01 1.05  CALCIUM 7.6* 7.4* 7.4* 7.5* 7.4*  MG 2.1 2.0 2.0 2.1 2.1    GFR: Estimated Creatinine Clearance: 52.3 mL/min (by C-G formula based on SCr of 1.05 mg/dL).  Liver Function Tests: Recent Labs  Lab 04/01/18 2132 04/06/18 0618  AST 44* 38  ALT 9 13  ALKPHOS 53 43  BILITOT 0.8 1.1  PROT 6.6 5.5*  ALBUMIN 2.4* 1.9*   No results for input(s): LIPASE, AMYLASE in the last 168 hours. No results for input(s): AMMONIA in the last 168 hours.  Coagulation Profile: No results for input(s): INR, PROTIME in the last 168 hours.  Cardiac Enzymes: No results for input(s): CKTOTAL, CKMB, CKMBINDEX, TROPONINI in the last 168 hours.  BNP (last 3 results) No results for  input(s): PROBNP in the last 8760 hours.  HbA1C: No results for input(s): HGBA1C in the last 72 hours.  CBG: Recent Labs  Lab 04/04/18 1941 04/05/18 1313  GLUCAP 118* 96    Lipid Profile: No results for input(s): CHOL, HDL, LDLCALC, TRIG, CHOLHDL, LDLDIRECT in the last 72 hours.  Thyroid Function Tests: No results for input(s): TSH, T4TOTAL, FREET4, T3FREE, THYROIDAB in the last 72 hours.  Anemia Panel: No results for input(s): VITAMINB12, FOLATE, FERRITIN, TIBC, IRON, RETICCTPCT in the last 72 hours.  Urine analysis:    Component Value Date/Time   COLORURINE AMBER (A) 04/02/2018 0525   APPEARANCEUR CLEAR 04/02/2018 0525   LABSPEC >1.046 (H) 04/02/2018 0525   PHURINE 6.0 04/02/2018 0525   GLUCOSEU NEGATIVE 04/02/2018 0525   HGBUR SMALL (A) 04/02/2018 0525   BILIRUBINUR NEGATIVE 04/02/2018 0525   KETONESUR 5 (A) 04/02/2018 0525   PROTEINUR 30 (  A) 04/02/2018 0525   NITRITE NEGATIVE 04/02/2018 0525   LEUKOCYTESUR NEGATIVE 04/02/2018 0525    Sepsis Labs: Lactic Acid, Venous    Component Value Date/Time   LATICACIDVEN 1.3 04/02/2018 0335    MICROBIOLOGY: Recent Results (from the past 240 hour(s))  Blood Culture (routine x 2)     Status: Abnormal   Collection Time: 04/01/18  9:35 PM  Result Value Ref Range Status   Specimen Description SITE NOT SPECIFIED  Final   Special Requests   Final    BOTTLES DRAWN AEROBIC AND ANAEROBIC Blood Culture adequate volume   Culture  Setup Time   Final    GRAM NEGATIVE RODS ANAEROBIC BOTTLE ONLY CRITICAL RESULT CALLED TO, READ BACK BY AND VERIFIED WITH: PHARMD J LEDFORD [id number] AT 725 AM BY CM    Culture (A)  Final    BACTEROIDES FRAGILIS BETA LACTAMASE POSITIVE CRITICAL RESULT CALLED TO, READ BACK BY AND VERIFIED WITH: Eduardo OsierHARMD M [facility] [id number] AT 1024 BY CM Performed at [facility], [address] [place], [place] [address]    Report Status 04/07/2018 FINAL  Final  Blood Culture ID Panel (Reflexed)     Status: None    Collection Time: 04/01/18  9:35 PM  Result Value Ref Range Status   Enterococcus species NOT DETECTED NOT DETECTED Final   Listeria monocytogenes NOT DETECTED NOT DETECTED Final   Staphylococcus species NOT DETECTED NOT DETECTED Final   Staphylococcus aureus (BCID) NOT DETECTED NOT DETECTED Final   Streptococcus species NOT DETECTED NOT DETECTED Final   Streptococcus agalactiae NOT DETECTED NOT DETECTED Final   Streptococcus pneumoniae NOT DETECTED NOT DETECTED Final   Streptococcus pyogenes NOT DETECTED NOT DETECTED Final   Acinetobacter baumannii NOT DETECTED NOT DETECTED Final   Enterobacteriaceae species NOT DETECTED NOT DETECTED Final   Enterobacter cloacae complex NOT DETECTED NOT DETECTED Final   Escherichia coli NOT DETECTED NOT DETECTED Final   Klebsiella oxytoca NOT DETECTED NOT DETECTED Final   Klebsiella pneumoniae NOT DETECTED NOT DETECTED Final   Proteus species NOT DETECTED NOT DETECTED Final   Serratia marcescens NOT DETECTED NOT DETECTED Final   Haemophilus influenzae NOT DETECTED NOT DETECTED Final   Neisseria meningitidis NOT DETECTED NOT DETECTED Final   Pseudomonas aeruginosa NOT DETECTED NOT DETECTED Final   Candida albicans NOT DETECTED NOT DETECTED Final   Candida glabrata NOT DETECTED NOT DETECTED Final   Candida krusei NOT DETECTED NOT DETECTED Final   Candida parapsilosis NOT DETECTED NOT DETECTED Final   Candida tropicalis NOT DETECTED NOT DETECTED Final    Comment: Performed at [facility], [address] [place], [place] [address]  Blood Culture (routine x 2)     Status: None   Collection Time: 04/01/18 10:16 PM  Result Value Ref Range Status   Specimen Description BLOOD LEFT HAND  Final   Special Requests   Final    BOTTLES DRAWN AEROBIC AND ANAEROBIC Blood Culture adequate volume   Culture   Final    NO GROWTH 5 DAYS Performed at [facility], [address] [place], [place] [address]    Report Status 04/06/2018 FINAL  Final  MRSA  PCR Screening     Status: None   Collection Time: 04/02/18  4:31 AM  Result Value Ref Range Status   MRSA by PCR NEGATIVE NEGATIVE Final    Comment:        The GeneXpert MRSA Assay (FDA approved for NASAL specimens only), is one component of  a comprehensive MRSA colonization surveillance program. It is not intended to diagnose MRSA infection nor to guide or monitor treatment for MRSA infections. Performed at [facility], [address] [place], [place] [address]     RADIOLOGY STUDIES/RESULTS: Ct Abdomen Pelvis W Contrast  Result Date: 04/02/2018 CLINICAL DATA:  Sepsis EXAM: CT ABDOMEN AND PELVIS WITH CONTRAST TECHNIQUE: Multidetector CT imaging of the abdomen and pelvis was performed using the standard protocol following bolus administration of intravenous contrast. CONTRAST:  100mL OMNIPAQUE IOHEXOL 300 MG/ML  SOLN COMPARISON:  08/12/2017 FINDINGS: Lower chest: Lung bases demonstrate no acute consolidation or effusion. Heart size is normal. Motion degraded study Hepatobiliary: No focal liver abnormality is seen. No gallstones, gallbladder wall thickening, or biliary dilatation. Pancreas: Unremarkable. No pancreatic ductal dilatation or surrounding inflammatory changes. Spleen: Normal in size without focal abnormality. Adrenals/Urinary Tract: Adrenal glands are normal. No hydronephrosis. Subcentimeter hypodensity mid left kidney too small to further characterize. Bladder normal Stomach/Bowel: Stomach is nonenlarged. No dilated small bowel. Large volume of stool in the colon. Moderate to large retained feces at the rectum. Vascular/Lymphatic: Moderate aortic atherosclerosis. No aneurysm. No significantly enlarged lymph nodes. Reproductive: Prostate is unremarkable. Other: Negative for free air or free fluid.  Mild presacral edema. Musculoskeletal: Asymmetric skin thickening and subcutaneous edema over the left gluteal region. Asymmetric enlargement of the left gluteal muscles. Gas within  the midline and left paramedian soft tissues overlying the sacrococcygeal region and left gluteal region. Scoliosis of the spine with degenerative changes. Chronic severe compression fracture of L3. IMPRESSION: 1. Motion degraded study which limits evaluation. 2. Skin thickening and moderate subcutaneous edema overlying the midline and left gluteal region, suspect for cellulitis. Gas within the subcutaneous soft tissues overlying the sacrococcygeal region and left gluteal region, presumably corresponding to the history of wound. Possibility of gas-forming infection/necrotizing infection is raised. No gross underlying osseous destruction. Mild asymmetric enlargement of the left gluteal muscles could be secondary to muscle edema/possible myositis. 3. Large volume of stool in the colon with large retained feces at the rectum suggesting constipation Electronically Signed   By: Jasmine PangKim  Fujinaga M.D.   On: 04/02/2018 02:09     LOS: 6 days   Jeoffrey MassedShanker Ghimire, MD  [facility]  If 7PM-7AM, please contact night-coverage  Please page via [email or web address] [email or web address]-click on MD name and type text message  04/08/2018, 1:46 PM

## 2018-04-08 NOTE — Progress Notes (Addendum)
NO CHARGE NOTE:   Chart reviewed today 04/08/2018. Upon arrival to room, patient eating lunch while watching TV. No family at bedside. Patient appears to be comfortable.  Patient's mood and mental status improved greatly since yesterday. Attempted to engage in conversation with patient, but he wished to eat his lunch in peace.   We will follow up tomorrow.  Jan Fireman, PA-S 04/08/2018 Palliative Medicine  Vennie Homans, FNP-C Palliative Medicine Team  Phone: [phone] Fax: [phone]

## 2018-04-08 NOTE — Progress Notes (Signed)
Pt refused scheduled med after being crushed in apple sauce only took half spoon and refused to take the rest of med will continue to monitor

## 2018-04-08 NOTE — Progress Notes (Signed)
CSW left voicemail for patient's spouse. [facility] has received insurance approval but cannot accept patient until his wife contacts [employer] to settle a previous car insurance claim.   Osborne Casco Dredyn Gubbels LCSW [phone]

## 2018-04-08 NOTE — Progress Notes (Signed)
[facility]/Trauma Progress Note  6 Days Post-Op   Assessment/Plan Necrotic sacral decub - S/PExcision sacral decubitus ulcer with debdridement, Dr. Cliffton Asters, 01/30 - wet to dry dressing changes q shift or more often as needed if soiled with stool - hydrotherapy - wound looks clean, no further surgical debridement indicated at this time - will see the wound M/W/F  FEN:reg diet VTE: SCD's,lovenox NB:VAPO & Zosyn 01/29-02/04; Unasyn 02/04>>  WBC 8.3, afebrile, monitor WBC Follow up:TBD   LOS: 6 days    Subjective: CC: Big toe hurts  No pain of sacral wound.  Objective: Vital signs in last 24 hours: Temp:  [98.4 F (36.9 C)-98.5 F (36.9 C)] 98.4 F (36.9 C) (02/05 0437) Pulse Rate:  [70-90] 77 (02/05 0437) Resp:  [16-19] 19 (02/05 0437) BP: (93-125)/(55-95) 125/95 (02/05 0437) SpO2:  [99 %-100 %] 100 % (02/05 0437) Last BM Date: 04/07/18  Intake/Output from previous day: 02/04 0701 - 02/05 0700 In: 960 [P.O.:960] Out: 2025 [Urine:2025] Intake/Output this shift: No intake/output data recorded.  PE: Gen: Alert, NAD LI:DCVUDT wound looks clean, no purulent drainage Skin: warm and dry    Anti-infectives: Anti-infectives (From admission, onward)   Start     Dose/Rate Route Frequency Ordered Stop   04/07/18 1100  Ampicillin-Sulbactam (UNASYN) 3 g in sodium chloride 0.9 % 100 mL IVPB     3 g 200 mL/hr over 30 Minutes Intravenous Every 6 hours 04/07/18 1047     04/02/18 2000  vancomycin (VANCOCIN) 1,500 mg in sodium chloride 0.9 % 500 mL IVPB  Status:  Discontinued     1,500 mg 250 mL/hr over 120 Minutes Intravenous Every 24 hours 04/02/18 0029 04/07/18 1000   04/02/18 0400  piperacillin-tazobactam (ZOSYN) IVPB 3.375 g  Status:  Discontinued     3.375 g 12.5 mL/hr over 240 Minutes Intravenous Every 8 hours 04/02/18 0029 04/07/18 1047   04/01/18 2230  vancomycin (VANCOCIN) 1,500 mg in sodium chloride 0.9 % 500 mL IVPB     1,500 mg 250 mL/hr  over 120 Minutes Intravenous  Once 04/01/18 2142 04/02/18 0039   04/01/18 2200  piperacillin-tazobactam (ZOSYN) IVPB 3.375 g     3.375 g 100 mL/hr over 30 Minutes Intravenous  Once 04/01/18 2142 04/01/18 2323      Lab Results:  Recent Labs    04/07/18 0351 04/08/18 0349  WBC 9.1 8.3  HGB 8.5* 8.1*  HCT 25.4* 25.0*  PLT 387 434*   BMET Recent Labs    04/07/18 0351 04/08/18 0349  NA 141 139  K 4.8 3.7  CL 114* 113*  CO2 18* 22  GLUCOSE 91 106*  BUN 13 13  CREATININE 1.01 1.05  CALCIUM 7.5* 7.4*   PT/INR No results for input(s): LABPROT, INR in the last 72 hours. CMP     Component Value Date/Time   NA 139 04/08/2018 0349   K 3.7 04/08/2018 0349   CL 113 (H) 04/08/2018 0349   CO2 22 04/08/2018 0349   GLUCOSE 106 (H) 04/08/2018 0349   BUN 13 04/08/2018 0349   CREATININE 1.05 04/08/2018 0349   CALCIUM 7.4 (L) 04/08/2018 0349   PROT 5.5 (L) 04/06/2018 0618   ALBUMIN 1.9 (L) 04/06/2018 0618   AST 38 04/06/2018 0618   ALT 13 04/06/2018 0618   ALKPHOS 43 04/06/2018 0618   BILITOT 1.1 04/06/2018 0618   GFRNONAA >60 04/08/2018 0349   GFRAA >60 04/08/2018 0349   Lipase  No results found for: LIPASE  Studies/Results: No results found.  Jerre Simon , [facility] 04/08/2018, 8:57 AM  Pager: [phone] Mon-Wed, Friday 7:00am-4:30pm Thurs 7am-11:30am  Consults: [phone]

## 2018-04-09 MED ORDER — AMOXICILLIN-POT CLAVULANATE 875-125 MG PO TABS: 1.0000 | ORAL_TABLET | Freq: Two times a day (BID) | ORAL | 0 refills | Status: AC

## 2018-04-09 MED ORDER — OXYCODONE HCL 5 MG PO TABS: 5.0000 mg | ORAL_TABLET | Freq: Four times a day (QID) | ORAL | 0 refills | Status: DC | PRN

## 2018-04-09 MED ORDER — ACETAMINOPHEN 500 MG PO TABS: 1000.0000 mg | ORAL_TABLET | Freq: Three times a day (TID) | ORAL | 0 refills | Status: AC | PRN

## 2018-04-09 MED ORDER — ENSURE ENLIVE PO LIQD: 237.0000 mL | Freq: Two times a day (BID) | ORAL | 12 refills | Status: DC

## 2018-04-09 MED ORDER — PRO-STAT SUGAR FREE PO LIQD: 30.0000 mL | Freq: Two times a day (BID) | ORAL | 0 refills | Status: DC

## 2018-04-09 NOTE — Discharge Summary (Signed)
PATIENT DETAILS Name: Mark Mathews Age: 78 y.o. Sex: male Date of Birth: 11/15/1940 MRN: [id number]. Admitting Physician: John Giovanni, MD WUJ:[id number], Consuella Lose, MD  Admit Date: 04/01/2018 Discharge date: 04/09/2018  Recommendations for Outpatient Follow-up:  1. Follow up with PCP in 1-2 weeks 2. Please obtain BMP/CBC in one week 3. Please ensure palliative care follow-up at SNF  Admitted From:  Home  Disposition: SNF   Home Health: No  Equipment/Devices: None  Discharge Condition: Stable  CODE STATUS:  DNR  Diet recommendation:  Regular  Brief Summary: See H&P, Labs, Consult and Test reports for all details in brief, Patient is a 78 y.o. male with history of Parkinson's disease presented with sepsis secondary to infected sacral decubitus ulcer Bacteroides fragilis bacteremia.  See below for further details  Brief Hospital Course: Severe sepsis secondary to infected sacral decubitus ulcer and Bacteroides fragilis bacteremia: Sepsis pathophysiology has resolved-remains on IV Unasyn.  General surgery following-underwent debridement on 1/30.  Recommendations are to do wound care with wet-to-dry dressing changes-per prior hospitalist MD note-Per general surgery-no further antibiotics required for the wound.  Since patient has Bacteroides fragilis bacteremia-we will transition to Augmentin-and complete a two-week course of antimicrobial therapy.   Parkinson's disease: Continue Sinemet, entacapone and trihexyphenidyl.  Follow with primary neurologist in the outpatient setting.  Failure to thrive syndrome: Very frail-suspect minimally ambulatory and mostly bedbound-poor overall prognosis-palliative care following-DNR in place, plans are for discharge to SNF.  Suspect if patient decompensates in becomes acutely ill in the future-comfort care would be appropriate.  Please ensure patient continues to follow with [facility] at [facility].  Moderate malnutrition: Continue  supplements.  Procedures/Studies: 1/30>>Excision of sacral decubitus ulcer with debdridement  Discharge Diagnoses:  Principal Problem:   Severe sepsis (HCC) Active Problems:   Sacral wound   Abdominal tenderness   Chronic anemia   Goals of care, counseling/discussion   Palliative care encounter   Malnutrition of moderate degree   Pressure injury of skin   Palliative care by specialist   Wound infection   Dementia with behavioral disturbance [facility])   Discharge Instructions:  Activity:  As tolerated with Full fall precautions use walker/cane & assistance as needed   Discharge Instructions    Call MD for:  redness, tenderness, or signs of infection (pain, swelling, redness, odor or green/yellow discharge around incision site)   Complete by:  As directed    Call MD for:  severe uncontrolled pain   Complete by:  As directed    Call MD for:  temperature >100.4   Complete by:  As directed    Diet - low sodium heart healthy   Complete by:  As directed    Discharge instructions   Complete by:  As directed    Follow with Primary MD  Maurice Small, MD in 1 week  Please get a complete blood count and chemistry panel checked by your Primary MD at your next visit, and again as instructed by your Primary MD.  Get Medicines reviewed and adjusted: Please take all your medications with you for your next visit with your Primary MD  Laboratory/radiological data: Please request your Primary MD to go over all hospital tests and procedure/radiological results at the follow up, please ask your Primary MD to get all Hospital records sent to his/her office.  In some cases, they will be blood work, cultures and biopsy results pending at the time of your discharge. Please request that your primary care M.D. follows up on these results.  Also Note the following: If you experience worsening of your admission symptoms, develop shortness of breath, life threatening emergency, suicidal or  homicidal thoughts you must seek medical attention immediately by calling 911 or calling your MD immediately  if symptoms less severe.  You must read complete instructions/literature along with all the possible adverse reactions/side effects for all the Medicines you take and that have been prescribed to you. Take any new Medicines after you have completely understood and accpet all the possible adverse reactions/side effects.   Do not drive when taking Pain medications or sleeping medications (Benzodaizepines)  Do not take more than prescribed Pain, Sleep and Anxiety Medications. It is not advisable to combine anxiety,sleep and pain medications without talking with your primary care practitioner  Special Instructions: If you have smoked or chewed Tobacco  in the last 2 yrs please stop smoking, stop any regular Alcohol  and or any Recreational drug use.  Wear Seat belts while driving.  Please note: You were cared for by a hospitalist during your hospital stay. Once you are discharged, your primary care physician will handle any further medical issues. Please note that NO REFILLS for any discharge medications will be authorized once you are discharged, as it is imperative that you return to your primary care physician (or establish a relationship with a primary care physician if you do not have one) for your post hospital discharge needs so that they can reassess your need for medications and monitor your lab values.   Discharge wound care:   Complete by:  As directed    wet to dry dressing changes q shift or more often as needed if soiled with stool   Increase activity slowly   Complete by:  As directed      Allergies as of 04/09/2018   No Known Allergies     Medication List    STOP taking these medications   citalopram 10 MG tablet Commonly known as:  CELEXA     TAKE these medications   acetaminophen 500 MG tablet Commonly known as:  TYLENOL Take 2 tablets (1,000 mg total) by mouth  every 8 (eight) hours as needed.   amoxicillin-clavulanate 875-125 MG tablet Commonly known as:  AUGMENTIN Take 1 tablet by mouth 2 (two) times daily for 5 days.   carbidopa-levodopa 25-100 MG tablet Commonly known as:  SINEMET IR TAKE ONE AND ONE-HALF TABLETS FIVE TIMES DAILY What changed:  See the new instructions.   entacapone 200 MG tablet Commonly known as:  COMTAN Take 1 tablet (200 mg total) by mouth 5 (five) times daily. In lieu of Stalevo 150 mg   feeding supplement (ENSURE ENLIVE) Liqd Take 237 mLs by mouth 2 (two) times daily between meals.   feeding supplement (PRO-STAT SUGAR FREE 64) Liqd Take 30 mLs by mouth 2 (two) times daily.   ibuprofen 200 MG tablet Commonly known as:  ADVIL,MOTRIN Take 200 mg by mouth every 6 (six) hours as needed for moderate pain.   oxyCODONE 5 MG immediate release tablet Commonly known as:  Oxy IR/ROXICODONE Take 1 tablet (5 mg total) by mouth every 6 (six) hours as needed for moderate pain.   trihexyphenidyl 2 MG tablet Commonly known as:  ARTANE Take 0.5 tablets (1 mg total) by mouth 3 (three) times daily with meals.            Discharge Care Instructions  (From admission, onward)         Start     Ordered  04/09/18 0000  Discharge wound care:    Comments:  wet to dry dressing changes q shift or more often as needed if soiled with stool   04/09/18 0900          Contact information for follow-up providers    Maurice Small, MD. Schedule an appointment as soon as possible for a visit in 1 week(s).   Specialty:  Family Medicine Contact information: [address] [place] [address] [phone]            Contact information for after-discharge care    Destination    [facility] Preferred SNF .   Service:  Skilled Nursing Contact information: [address] [place] [address] [phone]                 No Known  Allergies  Consultations:   general surgery and Palliative care  Other Procedures/Studies: Ct Abdomen Pelvis W Contrast  Result Date: 04/02/2018 CLINICAL DATA:  Sepsis EXAM: CT ABDOMEN AND PELVIS WITH CONTRAST TECHNIQUE: Multidetector CT imaging of the abdomen and pelvis was performed using the standard protocol following bolus administration of intravenous contrast. CONTRAST:  OMNIPAQUE IOHEXOL 300 MG/ML  SOLN COMPARISON:  08/12/2017 FINDINGS: Lower chest: Lung bases demonstrate no acute consolidation or effusion. Heart size is normal. Motion degraded study Hepatobiliary: No focal liver abnormality is seen. No gallstones, gallbladder wall thickening, or biliary dilatation. Pancreas: Unremarkable. No pancreatic ductal dilatation or surrounding inflammatory changes. Spleen: Normal in size without focal abnormality. Adrenals/Urinary Tract: Adrenal glands are normal. No hydronephrosis. Subcentimeter hypodensity mid left kidney too small to further characterize. Bladder normal Stomach/Bowel: Stomach is nonenlarged. No dilated small bowel. Large volume of stool in the colon. Moderate to large retained feces at the rectum. Vascular/Lymphatic: Moderate aortic atherosclerosis. No aneurysm. No significantly enlarged lymph nodes. Reproductive: Prostate is unremarkable. Other: Negative for free air or free fluid.  Mild presacral edema. Musculoskeletal: Asymmetric skin thickening and subcutaneous edema over the left gluteal region. Asymmetric enlargement of the left gluteal muscles. Gas within the midline and left paramedian soft tissues overlying the sacrococcygeal region and left gluteal region. Scoliosis of the spine with degenerative changes. Chronic severe compression fracture of L3. IMPRESSION: 1. Motion degraded study which limits evaluation. 2. Skin thickening and moderate subcutaneous edema overlying the midline and left gluteal region, suspect for cellulitis. Gas within the subcutaneous soft tissues  overlying the sacrococcygeal region and left gluteal region, presumably corresponding to the history of wound. Possibility of gas-forming infection/necrotizing infection is raised. No gross underlying osseous destruction. Mild asymmetric enlargement of the left gluteal muscles could be secondary to muscle edema/possible myositis. 3. Large volume of stool in the colon with large retained feces at the rectum suggesting constipation Electronically Signed   By: Jasmine Pang M.D.   On: 04/02/2018 02:09      TODAY-DAY OF DISCHARGE:  Subjective:   Mark Mathews today lying comfortably in bed-Per nursing staff-no major issues overnight.  Objective:   Blood pressure (!) 113/52, pulse 63, temperature 98.6 F (37 C), temperature source Oral, resp. rate 18, height 5\' 6"  (1.676 m), weight 72.5 kg, SpO2 95 %.  Intake/Output Summary (Last 24 hours) at 04/09/2018 0900 Last data filed at 04/09/2018 0433 Gross per 24 hour  Intake 364.11 ml  Output 750 ml  Net -385.89 ml   Filed Weights   04/01/18 2132 04/02/18 0229 04/06/18 0500  Weight: 70.3 kg 63.9 kg 72.5 kg    Exam: Awake Alert,  No new F.N deficits, Normal affect [place].AT,PERRAL Supple Neck,No JVD, No cervical lymphadenopathy appriciated.  Symmetrical Chest wall movement, Good air movement bilaterally, CTAB RRR,No Gallops,Rubs or new Murmurs, No Parasternal Heave +ve B.Sounds, Abd Soft, Non tender, No organomegaly appriciated, No rebound -guarding or rigidity. No Cyanosis, Clubbing or edema, No new Rash or bruise   PERTINENT RADIOLOGIC STUDIES: Ct Abdomen Pelvis W Contrast  Result Date: 04/02/2018 CLINICAL DATA:  Sepsis EXAM: CT ABDOMEN AND PELVIS WITH CONTRAST TECHNIQUE: Multidetector CT imaging of the abdomen and pelvis was performed using the standard protocol following bolus administration of intravenous contrast. CONTRAST:  100mL OMNIPAQUE IOHEXOL 300 MG/ML  SOLN COMPARISON:  08/12/2017 FINDINGS: Lower chest: Lung bases demonstrate no acute  consolidation or effusion. Heart size is normal. Motion degraded study Hepatobiliary: No focal liver abnormality is seen. No gallstones, gallbladder wall thickening, or biliary dilatation. Pancreas: Unremarkable. No pancreatic ductal dilatation or surrounding inflammatory changes. Spleen: Normal in size without focal abnormality. Adrenals/Urinary Tract: Adrenal glands are normal. No hydronephrosis. Subcentimeter hypodensity mid left kidney too small to further characterize. Bladder normal Stomach/Bowel: Stomach is nonenlarged. No dilated small bowel. Large volume of stool in the colon. Moderate to large retained feces at the rectum. Vascular/Lymphatic: Moderate aortic atherosclerosis. No aneurysm. No significantly enlarged lymph nodes. Reproductive: Prostate is unremarkable. Other: Negative for free air or free fluid.  Mild presacral edema. Musculoskeletal: Asymmetric skin thickening and subcutaneous edema over the left gluteal region. Asymmetric enlargement of the left gluteal muscles. Gas within the midline and left paramedian soft tissues overlying the sacrococcygeal region and left gluteal region. Scoliosis of the spine with degenerative changes. Chronic severe compression fracture of L3. IMPRESSION: 1. Motion degraded study which limits evaluation. 2. Skin thickening and moderate subcutaneous edema overlying the midline and left gluteal region, suspect for cellulitis. Gas within the subcutaneous soft tissues overlying the sacrococcygeal region and left gluteal region, presumably corresponding to the history of wound. Possibility of gas-forming infection/necrotizing infection is raised. No gross underlying osseous destruction. Mild asymmetric enlargement of the left gluteal muscles could be secondary to muscle edema/possible myositis. 3. Large volume of stool in the colon with large retained feces at the rectum suggesting constipation Electronically Signed   By: Jasmine PangKim  Fujinaga M.D.   On: 04/02/2018 02:09      PERTINENT LAB RESULTS: CBC: Recent Labs    04/07/18 0351 04/08/18 0349  WBC 9.1 8.3  HGB 8.5* 8.1*  HCT 25.4* 25.0*  PLT 387 434*   CMET CMP     Component Value Date/Time   NA 139 04/08/2018 0349   K 3.7 04/08/2018 0349   CL 113 (H) 04/08/2018 0349   CO2 22 04/08/2018 0349   GLUCOSE 106 (H) 04/08/2018 0349   BUN 13 04/08/2018 0349   CREATININE 1.05 04/08/2018 0349   CALCIUM 7.4 (L) 04/08/2018 0349   PROT 5.5 (L) 04/06/2018 0618   ALBUMIN 1.9 (L) 04/06/2018 0618   AST 38 04/06/2018 0618   ALT 13 04/06/2018 0618   ALKPHOS 43 04/06/2018 0618   BILITOT 1.1 04/06/2018 0618   GFRNONAA >60 04/08/2018 0349   GFRAA >60 04/08/2018 0349    GFR Estimated Creatinine Clearance: 52.3 mL/min (by C-G formula based on SCr of 1.05 mg/dL). No results for input(s): LIPASE, AMYLASE in the last 72 hours. No results for input(s): CKTOTAL, CKMB, CKMBINDEX, TROPONINI in the last 72 hours. Invalid input(s): POCBNP No results for input(s): DDIMER in the last 72 hours. No results for input(s): HGBA1C in the last 72 hours. No  results for input(s): CHOL, HDL, LDLCALC, TRIG, CHOLHDL, LDLDIRECT in the last 72 hours. No results for input(s): TSH, T4TOTAL, T3FREE, THYROIDAB in the last 72 hours.  Invalid input(s): FREET3 No results for input(s): VITAMINB12, FOLATE, FERRITIN, TIBC, IRON, RETICCTPCT in the last 72 hours. Coags: No results for input(s): INR in the last 72 hours.  Invalid input(s): PT Microbiology: Recent Results (from the past 240 hour(s))  Blood Culture (routine x 2)     Status: Abnormal   Collection Time: 04/01/18  9:35 PM  Result Value Ref Range Status   Specimen Description SITE NOT SPECIFIED  Final   Special Requests   Final    BOTTLES DRAWN AEROBIC AND ANAEROBIC Blood Culture adequate volume   Culture  Setup Time   Final    GRAM NEGATIVE RODS ANAEROBIC BOTTLE ONLY CRITICAL RESULT CALLED TO, READ BACK BY AND VERIFIED WITH: PHARMD J LEDFORD [id number] AT 725 AM BY CM     Culture (A)  Final    BACTEROIDES FRAGILIS BETA LACTAMASE POSITIVE CRITICAL RESULT CALLED TO, READ BACK BY AND VERIFIED WITH: Eduardo Osier [facility] [id number] AT 1024 BY CM Performed at [facility], [address] [place], [place] [address]    Report Status 04/07/2018 FINAL  Final  Blood Culture ID Panel (Reflexed)     Status: None   Collection Time: 04/01/18  9:35 PM  Result Value Ref Range Status   Enterococcus species NOT DETECTED NOT DETECTED Final   Listeria monocytogenes NOT DETECTED NOT DETECTED Final   Staphylococcus species NOT DETECTED NOT DETECTED Final   Staphylococcus aureus (BCID) NOT DETECTED NOT DETECTED Final   Streptococcus species NOT DETECTED NOT DETECTED Final   Streptococcus agalactiae NOT DETECTED NOT DETECTED Final   Streptococcus pneumoniae NOT DETECTED NOT DETECTED Final   Streptococcus pyogenes NOT DETECTED NOT DETECTED Final   Acinetobacter baumannii NOT DETECTED NOT DETECTED Final   Enterobacteriaceae species NOT DETECTED NOT DETECTED Final   Enterobacter cloacae complex NOT DETECTED NOT DETECTED Final   Escherichia coli NOT DETECTED NOT DETECTED Final   Klebsiella oxytoca NOT DETECTED NOT DETECTED Final   Klebsiella pneumoniae NOT DETECTED NOT DETECTED Final   Proteus species NOT DETECTED NOT DETECTED Final   Serratia marcescens NOT DETECTED NOT DETECTED Final   Haemophilus influenzae NOT DETECTED NOT DETECTED Final   Neisseria meningitidis NOT DETECTED NOT DETECTED Final   Pseudomonas aeruginosa NOT DETECTED NOT DETECTED Final   Candida albicans NOT DETECTED NOT DETECTED Final   Candida glabrata NOT DETECTED NOT DETECTED Final   Candida krusei NOT DETECTED NOT DETECTED Final   Candida parapsilosis NOT DETECTED NOT DETECTED Final   Candida tropicalis NOT DETECTED NOT DETECTED Final    Comment: Performed at [facility], [address], [place], [place] [address]  Blood Culture (routine x 2)     Status: None   Collection Time: 04/01/18 10:16 PM   Result Value Ref Range Status   Specimen Description BLOOD LEFT HAND  Final   Special Requests   Final    BOTTLES DRAWN AEROBIC AND ANAEROBIC Blood Culture adequate volume   Culture   Final    NO GROWTH 5 DAYS Performed at [facility], [address], [place], [place] [address]    Report Status 04/06/2018 FINAL  Final  MRSA PCR Screening     Status: None   Collection Time: 04/02/18  4:31 AM  Result Value Ref Range Status   MRSA by PCR NEGATIVE NEGATIVE Final  Comment:        The GeneXpert MRSA Assay (FDA approved for NASAL specimens only), is one component of a comprehensive MRSA colonization surveillance program. It is not intended to diagnose MRSA infection nor to guide or monitor treatment for MRSA infections. Performed at [facility], [address] [place], [place] [id number]     FURTHER DISCHARGE INSTRUCTIONS:  Get Medicines reviewed and adjusted: Please take all your medications with you for your next visit with your Primary MD  Laboratory/radiological data: Please request your Primary MD to go over all hospital tests and procedure/radiological results at the follow up, please ask your Primary MD to get all Hospital records sent to his/her office.  In some cases, they will be blood work, cultures and biopsy results pending at the time of your discharge. Please request that your primary care M.D. goes through all the records of your hospital data and follows up on these results.  Also Note the following: If you experience worsening of your admission symptoms, develop shortness of breath, life threatening emergency, suicidal or homicidal thoughts you must seek medical attention immediately by calling 911 or calling your MD immediately  if symptoms less severe.  You must read complete instructions/literature along with all the possible adverse reactions/side effects for all the Medicines you take and that have been prescribed to you. Take any new  Medicines after you have completely understood and accpet all the possible adverse reactions/side effects.   Do not drive when taking Pain medications or sleeping medications (Benzodaizepines)  Do not take more than prescribed Pain, Sleep and Anxiety Medications. It is not advisable to combine anxiety,sleep and pain medications without talking with your primary care practitioner  Special Instructions: If you have smoked or chewed Tobacco  in the last 2 yrs please stop smoking, stop any regular Alcohol  and or any Recreational drug use.  Wear Seat belts while driving.  Please note: You were cared for by a hospitalist during your hospital stay. Once you are discharged, your primary care physician will handle any further medical issues. Please note that NO REFILLS for any discharge medications will be authorized once you are discharged, as it is imperative that you return to your primary care physician (or establish a relationship with a primary care physician if you do not have one) for your post hospital discharge needs so that they can reassess your need for medications and monitor your lab values.  Total Time spent coordinating discharge including counseling, education and face to face time equals 35 minutes.  Signed: Abygayle Deltoro 04/09/2018 9:00 AM

## 2018-04-09 NOTE — Progress Notes (Signed)
Physical Therapy Treatment Patient Details Name: Mark Mathews MRN: [id number] DOB: 1941/02/25 Today's Date: 04/09/2018    History of Present Illness Mark Mathews is a 78 y.o. male with medical history significant for Parkinson's disease, depression being brought into the hospital by family due to concern for a sacral wound.  Wife at bedside states patient has had a sacral wound for the past 10 days which has been getting progressively worse.  States patient is bedridden at baseline.  He has been more lethargic recently and sleeping all day    PT Comments    Pt was not as tolerant of the contracture management of his legs or the transition to and sitting on the EOB.  Bil knees  Attained -30* extension without distress from the patient.  Pt unable to tolerate more aggressive stretching today.    Follow Up Recommendations  SNF;Supervision/Assistance - 24 hour     Equipment Recommendations       Recommendations for Other Services       Precautions / Restrictions Precautions Precautions: Fall Precaution Comments: contracture    Mobility  Bed Mobility Overal bed mobility: Needs Assistance Bed Mobility: Rolling;Sidelying to Sit;Sit to Supine Rolling: Total assist Sidelying to sit: Total assist;+2 for physical assistance   Sit to supine: Total assist;+2 for physical assistance   General bed mobility comments: pt was not able to assist to sitting, but did grip hand/covers, possibly in fear of falling or from pain from contracture.  Transfers                    Ambulation/Gait                 Stairs             Wheelchair Mobility    Modified Rankin (Stroke Patients Only)       Balance Overall balance assessment: Needs assistance Sitting-balance support: Single extremity supported;Bilateral upper extremity supported Sitting balance-Leahy Scale: Poor Sitting balance - Comments: pt needed moderate assist today in addition to his UE assist                                    Cognition Arousal/Alertness: Awake/alert Behavior During Therapy: Flat affect;Anxious Overall Cognitive Status: Difficult to assess                                        Exercises      General Comments        Pertinent Vitals/Pain Pain Assessment: Faces Faces Pain Scale: Hurts little more Pain Location: legs, bottom Pain Descriptors / Indicators: Grimacing;Moaning Pain Intervention(s): Monitored during session;Repositioned;Limited activity within patient's tolerance    Home Living                      Prior Function            PT Goals (current goals can now be found in the care plan section) Acute Rehab PT Goals Patient Stated Goal: pt unable: wife wants to get rid of infection then move to next step in the rehab process. PT Goal Formulation: With patient/family Time For Goal Achievement: 04/17/18 Potential to Achieve Goals: Fair Progress towards PT goals: Not progressing toward goals - comment(pt unable to handle contracture management today.)    Frequency  Min 2X/week      PT Plan Current plan remains appropriate    Co-evaluation              AM-PAC PT "6 Clicks" Mobility   Outcome Measure  Help needed turning from your back to your side while in a flat bed without using bedrails?: Total Help needed moving from lying on your back to sitting on the side of a flat bed without using bedrails?: Total Help needed moving to and from a bed to a chair (including a wheelchair)?: Total Help needed standing up from a chair using your arms (e.g., wheelchair or bedside chair)?: Total Help needed to walk in hospital room?: Total Help needed climbing 3-5 steps with a railing? : Total 6 Click Score: 6    End of Session   Activity Tolerance: Patient limited by pain;Other (comment)(or mild agitation to stop.) Patient left: in bed;with call bell/phone within reach;with bed alarm set Nurse  Communication: Mobility status PT Visit Diagnosis: Other abnormalities of gait and mobility (R26.89);Adult, failure to thrive (R62.7);Pain Pain - part of body: Leg(bil)     Time: [id number] PT Time Calculation (min) (ACUTE ONLY): 16 min  Charges:  $Therapeutic Activity: 8-22 mins                     04/09/2018  [place] BingKen Arwen Mathews, PT Acute Rehabilitation Services [phone]  (pager) [phone]  (office)   Mark GumKenneth V Azahel Mathews 04/09/2018, 3:38 PM

## 2018-04-09 NOTE — Progress Notes (Signed)
Physical Therapy Wound Treatment Patient Details  Name: AALIYAH CANCRO MRN: [id number] Date of Birth: 02-Dec-1940  Today's Date: 04/09/2018 Time: 1024-1057 Time Calculation (min): 33 min  Subjective  Subjective: "How are you?" pt asking Patient and Family Stated Goals: not stated Date of Onset: (unknown) Prior Treatments: Surgical I&D, dressing changes  Pain Score:  Premedicated. Pain all over with repositioning  Wound Assessment  Pressure Injury 04/02/18 Unstageable - Full thickness tissue loss in which the base of the ulcer is covered by slough (yellow, tan, gray, green or brown) and/or eschar (tan, brown or black) in the wound bed. 14x8 (Active)  Dressing Type ABD;Barrier Film (skin prep);Gauze (Comment);Moist to moist 04/09/2018 11:12 AM  Dressing Changed;Clean;Dry;Intact 04/09/2018 11:12 AM  Dressing Change Frequency Daily 04/09/2018 11:12 AM  State of Healing Early/partial granulation 04/09/2018 11:12 AM  Site / Wound Assessment Red;Pink;Yellow;Brown 04/09/2018 11:12 AM  % Wound base Red or Granulating 65% 04/09/2018 11:12 AM  % Wound base Yellow/Fibrinous Exudate 10% 04/09/2018 11:12 AM  % Wound base Black/Eschar 15% 04/09/2018 11:12 AM  % Wound base Other/Granulation Tissue (Comment) 10% 04/09/2018 11:12 AM  Peri-wound Assessment Erythema (blanchable) 04/09/2018 11:12 AM  Wound Length (cm) 15 cm 04/06/2018 10:57 AM  Wound Width (cm) 11 cm 04/06/2018 10:57 AM  Wound Depth (cm) 3.8 cm 04/06/2018 10:57 AM  Wound Surface Area (cm^2) 165 cm^2 04/06/2018 10:57 AM  Wound Volume (cm^3) 627 cm^3 04/06/2018 10:57 AM  Undermining (cm) 1-2 cm from 6-9 oclock 04/06/2018 10:57 AM  Margins Unattached edges (unapproximated) 04/09/2018 11:12 AM  Drainage Amount Moderate 04/09/2018 11:12 AM  Drainage Description Serous 04/09/2018 11:12 AM  Treatment Debridement (Selective);Hydrotherapy (Pulse lavage);Packing (Saline gauze) 04/09/2018 11:12 AM   Hydrotherapy Pulsed lavage therapy - wound location: sacrum Pulsed Lavage with  Suction (psi): 8 psi Pulsed Lavage with Suction - Normal Saline Used: 1000 mL Pulsed Lavage Tip: Tip with splash shield Selective Debridement Selective Debridement - Location: sacrum Selective Debridement - Tools Used: Forceps;Scissors Selective Debridement - Tissue Removed: minimal yellow and brown necrotic tissue   Wound Assessment and Plan  Wound Therapy - Assess/Plan/Recommendations Wound Therapy - Clinical Statement: Slow progress with removal of nonviable tissue.  Wound Therapy - Functional Problem List: Decr mobility Factors Delaying/Impairing Wound Healing: Immobility;Multiple medical problems;Polypharmacy Hydrotherapy Plan: Debridement;Dressing change;Patient/family education;Pulsatile lavage with suction Wound Therapy - Frequency: 6X / week Wound Therapy - Follow Up Recommendations: Skilled nursing facility Wound Plan: See above  Wound Therapy Goals- Improve the function of patient's integumentary system by progressing the wound(s) through the phases of wound healing (inflammation - proliferation - remodeling) by: Decrease Necrotic Tissue to: 20 Decrease Necrotic Tissue - Progress: Progressing toward goal Increase Granulation Tissue to: 80 Increase Granulation Tissue - Progress: Progressing toward goal  Goals will be updated until maximal potential achieved or discharge criteria met.  Discharge criteria: when goals achieved, discharge from hospital, MD decision/surgical intervention, no progress towards goals, refusal/missing three consecutive treatments without notification or medical reason.  GP     Matthews 04/09/2018, 11:15 AM [place] Pager [phone] Office [phone]

## 2018-04-09 NOTE — Progress Notes (Signed)
Patient is getting ready to be discharged with EMS.  I attempted to call report to [facility] 7 times.  Will wait for them to call back.

## 2018-04-09 NOTE — Clinical Social Work Placement (Signed)
   CLINICAL SOCIAL WORK PLACEMENT  NOTE  Date:  04/09/2018  Patient Details  Name: Mark Mathews MRN: [id number] Date of Birth: 1940/05/23  Clinical Social Work is seeking post-discharge placement for this patient at the Skilled  Nursing Facility level of care (*CSW will initial, date and re-position this form in  chart as items are completed):  Yes   Patient/family provided with [facility] Clinical Social Work Department's list of facilities offering this level of care within the geographic area requested by the patient (or if unable, by the patient's family).  Yes   Patient/family informed of their freedom to choose among providers that offer the needed level of care, that participate in Medicare, Medicaid or managed care program needed by the patient, have an available bed and are willing to accept the patient.  Yes   Patient/family informed of [facility] ownership interest in [facility] and [facility], as well as of the fact that they are under no obligation to receive care at these facilities.  PASRR submitted to EDS on 04/07/18     PASRR number received on 04/07/18     Existing PASRR number confirmed on       FL2 transmitted to all facilities in geographic area requested by pt/family on 04/07/18     FL2 transmitted to all facilities within larger geographic area on       Patient informed that his/her managed care company has contracts with or will negotiate with certain facilities, including the following:        Yes   Patient/family informed of bed offers received.  [facility]     Physician recommends and patient chooses bed at      [facility].  Patient to be transferred to facility by [facility]     Patient family notified on 04/09/18 of transfer.  Name of family member notified:  Spouse, Elizabeth     PHYSICIAN       Additional Comment:     _______________________________________________ Mearl LatinNadia S Tyiesha Brackney, LCSW 04/09/2018, 9:52 AM

## 2018-04-13 ENCOUNTER — Non-Acute Institutional Stay: Payer: Medicare Other | Admitting: Internal Medicine

## 2018-04-13 ENCOUNTER — Encounter: Payer: Self-pay | Admitting: Internal Medicine

## 2018-04-13 VITALS — BP 116/70 | HR 84 | Resp 16 | Wt 134.0 lb

## 2018-04-13 DIAGNOSIS — Z515 Encounter for palliative care: Secondary | ICD-10-CM

## 2018-04-13 NOTE — Progress Notes (Addendum)
Feb 10th, 2020 [facility] Palliative Care Consult Note Telephone: [phone]  Fax: [phone]  PATIENT NAME: Mark Mathews DOB: 04-22-1940 MRN: [id number]  Mark Mathews RM 214-A    04/09/18  PRIMARY CARE PROVIDER:   Maurice Small, MD  [address] [place], [place] [address] Dr. Huston Mathews (Neurology)  REFERRING PROVIDER:  Coral Ceo PA-C/Dr. Georgann Mathews  RESPONSIBLE PARTY:   RP: (spouse) Mark Mathews   work: [phone]   cell: [phone]. (son) Mark Mathews  [phone]  ASSESSMENT:     1. Functional and cognitive decline r/t dementia / significant Parkinson's disease: PPS 30%, FAST: 7d. Patient is dependent for hygiene, dressing, transfers, and partial to total assist with eating. He is incontinent of bowel; Mathews catheter in place (to promote healing of sacral wound. His oral intake has decreased to 25% of meals. He sometimes attempts to feed himself finger food. He is unable to follow commands. His speech is babbling and nonsensical. He is resistant to personal care and may grab at staff or swing out. Their are no recent weights on the chart. Last recorded weight 02/16/2018 was 158 lbs. At a height of 5'7" his BMI was 24.75 kg/m2. His wife mentions he was around 160lbs in the hospital. He has a very large sacral decub, dressing changes by facility staff.  Addendum (04/14/2018 3:20pm) Weight from 04/10/2018 was 134lbs. This is a decrease of 24 lbs (15.2% of his body weight) over the last 2 months. His BMI is 21 kg/m2  2. Advanced care directives: DNR on chart.  3. Goals of Care: Patient's wife Mark Mathews wishes hospice referral when he is eligible. He is currently receiving short term rehab. Mark Mathews was under the impression that patient could be admitted directly to [facility]. I clarified that [facility] beds are reserved for those with anticipated prognosis of 2 weeks or less. Mark Mathews has a meeting with the facility SW Mark Mathews)  this afternoon. Ms. 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 She does not anticipate being able to take care of the patient in her home. She has 2 sons, one of whom live near [place], and the other in [place].   I spent 90 minutes providing this consultation,  from 11:30am to 1pm. More than 50% of the time in this consultation was spent coordinating communication, interview of staff, and f/u communication with family members.   HISTORY OF PRESENT ILLNESS:  Mark Mathews is a 78 y.o. male with medical h/o Parkinson's disease, dementia with behavioral disturbance (depression, anxiety, anger outbursts, delusions), sacral wound, failure to thrive syndrome, osteoporosis / scoliosis, and recurrent falls and vertigo. Patient is s/p recent hospital admission 1/29-04/09/2018 for sepsis 2/2 infected sacral decubitus ulcer (surgical debridement). Palliative Care was asked to assist with establishing goals of care.  CODE STATUS: DNR  PPS: 30%  HOSPICE ELIGIBILITY/DIAGNOSIS: TBD  PAST MEDICAL HISTORY:  Past Medical History:  Diagnosis Date  . Other and unspecified hyperlipidemia   . Paralysis agitans (HCC)   . Parkinson's disease (HCC) 10/12/2012  . Spinal stenosis     SOCIAL HX:  Social History   Tobacco Use  . Smoking status: Never Smoker  . Smokeless tobacco: Never Used  Substance Use Topics  . Alcohol use: No    Alcohol/week: 0.0 standard drinks    ALLERGIES: No Known Allergies   PERTINENT MEDICATIONS:  Outpatient Encounter Medications as of 04/13/2018  Medication Sig  . acetaminophen (TYLENOL) 500  MG tablet Take 2 tablets (1,000 mg total) by mouth every 8 (eight) hours as needed.  . Amino Acids-Protein Hydrolys (FEEDING SUPPLEMENT, PRO-STAT SUGAR FREE 64,) LIQD Take 30 mLs by mouth 2 (two) times daily.  Marland Kitchen. amoxicillin-clavulanate (AUGMENTIN) 875-125 MG tablet Take 1 tablet by mouth 2 (two) times daily for 5 days.  .  carbidopa-levodopa (SINEMET IR) 25-100 MG tablet TAKE ONE AND ONE-HALF TABLETS FIVE TIMES DAILY (Patient taking differently: Take 1.5 tablets by mouth 5 (five) times daily. )  . entacapone (COMTAN) 200 MG tablet Take 1 tablet (200 mg total) by mouth 5 (five) times daily. In lieu of Stalevo 150 mg  . feeding supplement, ENSURE ENLIVE, (ENSURE ENLIVE) LIQD Take 237 mLs by mouth 2 (two) times daily between meals.  Marland Kitchen. ibuprofen (ADVIL,MOTRIN) 200 MG tablet Take 200 mg by mouth every 6 (six) hours as needed for moderate pain.   Marland Kitchen. oxyCODONE (OXY IR/ROXICODONE) 5 MG immediate release tablet Take 1 tablet (5 mg total) by mouth every 6 (six) hours as needed for moderate pain.  . trihexyphenidyl (ARTANE) 2 MG tablet Take 0.5 tablets (1 mg total) by mouth 3 (three) times daily with meals.   No facility-administered encounter medications on file as of 04/13/2018.     PHYSICAL EXAM:  VS: BP 116 systolic , HR 84, RR 16 Slender, frail appearing elderly Caucasian male lying half on right side, half on his back. Rambling, babbling, and incoherent speech. Lung fields CTA.  Cardiac: RRR without MRG.  Abdomen: soft, non-tender, non-distended.  GU: Mathews catheter intact.  Extremities: Contractures all extremities. Muscular / adipose wasting. No edema.  Mark LisMary P Canary Fister, NP

## 2018-04-14 ENCOUNTER — Encounter: Payer: Self-pay | Admitting: Internal Medicine

## 2018-04-28 ENCOUNTER — Telehealth: Payer: Self-pay | Admitting: Neurology

## 2018-04-28 NOTE — Telephone Encounter (Signed)
I called pt's wife, Mark Mathews, per DPR, and had an extended conversation with her. Pt was recently d/c from hospital s/p wound debridement and sepsis. He is at a SNF. Pt's wife reports that she believes that the SNF is overcharging her and she needs him to be d/c from the SNF. She is requesting a statement of medical necessity from Dr. Frances Furbish to send to insurance stating that it is medically necessary that pt received skilled nursing at home, PT/OT/ST, a hospital bed, wound care, would care supplies, etc. She doesn't know where this statement needs to be sent, other than it should go to insurance and asked that I "figure it out." I advised her that I will speak with Dr. Frances Furbish regarding this.  I spoke with Dr. Frances Furbish. She reports that she cannot write this letter since she has not evaluated pt's pressure wound. This statement should be given by the physicians would discharged him from the hospital or who are involved in pt's wound care.  I called pt's wife again to discuss this, no answer, left a VM asking her to call me back.

## 2018-04-28 NOTE — Telephone Encounter (Signed)
Patients wife is Leretha Pol on [place]

## 2018-04-28 NOTE — Telephone Encounter (Signed)
Patients wife Khayree calling to let Dr Frances Furbish know that patient is in [facility]. Primary diagnosis is parkinsons but he has a severe pressure wound. Requesting home health & DME covered by insurance. Please call to advise [phone]

## 2018-04-28 NOTE — Telephone Encounter (Signed)
I understand her concerns, but unfortunately, I cannot write a letter of medical necessity as requested. She would have to go by the recommendations of his treating physician at the skilled nursing facility and prior to that go by the recommendations from the hospital doctors. I am not an expert in wound care management and would have to defer to the physicians he has seen for this. I did review the hospital discharge summary; unfortunately he did have a severe form of infection and needed a longer course of antibiotic treatment. Due to his overall frail health and poor mobility, he will continue to be at risk for further decline, infection etc. I would favor that she follow all the recommendations that are provided by his current health care team at the skilled nursing facility.  Please explain to her when she calls back.

## 2018-04-29 NOTE — Telephone Encounter (Signed)
I called pt's wife again to discuss. No answer, left a message asking her to call me back. 

## 2018-04-29 NOTE — Telephone Encounter (Signed)
Pt's wife, Mark Mathews, per [facility], returned my call. I advised her of Dr. Teofilo Pod recommendations. Pt's wife expressed disappointment and disbelief that Dr. Frances Furbish will not write the statements she needs. I explained that Dr. Frances Furbish has not evaluated pt's wound nor is a wound care expert so cannot write statements regarding this nor order supplies for pt's wounds. Pt's wife verbalized appreciation for my call.

## 2018-05-21 ENCOUNTER — Ambulatory Visit: Payer: Self-pay | Admitting: Neurology

## 2018-11-19 ENCOUNTER — Telehealth: Payer: Self-pay

## 2018-11-19 NOTE — Telephone Encounter (Signed)
Received call from [place] with [facility] to inquire if Palliative Care was involved with patient after being d/c from Hospice. Family would like Palliative Care to follow patient. PCP office made aware. New referral obtained for Palliative Care.

## 2018-11-24 ENCOUNTER — Telehealth: Payer: Self-pay | Admitting: Internal Medicine

## 2018-11-24 NOTE — Telephone Encounter (Signed)
Called patient's wife's cell to schedule Palliative Consult, no answer.  Left message with reason for call along with my contact information

## 2018-11-26 ENCOUNTER — Emergency Department (HOSPITAL_COMMUNITY)
Admission: EM | Admit: 2018-11-26 | Discharge: 2018-11-27 | Disposition: A | Discharge status: Home / Self Care | Payer: Medicare Other | Attending: Emergency Medicine | Admitting: Emergency Medicine

## 2018-11-26 ENCOUNTER — Telehealth: Payer: Self-pay | Admitting: Neurology

## 2018-11-26 ENCOUNTER — Encounter (HOSPITAL_COMMUNITY): Payer: Self-pay

## 2018-11-26 ENCOUNTER — Other Ambulatory Visit: Payer: Self-pay

## 2018-11-26 DIAGNOSIS — F0391 Unspecified dementia with behavioral disturbance: Secondary | ICD-10-CM | POA: Insufficient documentation

## 2018-11-26 DIAGNOSIS — T83091A Other mechanical complication of indwelling urethral catheter, initial encounter: Secondary | ICD-10-CM | POA: Insufficient documentation

## 2018-11-26 DIAGNOSIS — Z79899 Other long term (current) drug therapy: Secondary | ICD-10-CM | POA: Insufficient documentation

## 2018-11-26 DIAGNOSIS — Y828 Other medical devices associated with adverse incidents: Secondary | ICD-10-CM | POA: Diagnosis not present

## 2018-11-26 DIAGNOSIS — T83011A Breakdown (mechanical) of indwelling urethral catheter, initial encounter: Secondary | ICD-10-CM

## 2018-11-26 DIAGNOSIS — G20 Parkinson's disease: Secondary | ICD-10-CM | POA: Diagnosis not present

## 2018-11-26 DIAGNOSIS — N3090 Cystitis, unspecified without hematuria: Secondary | ICD-10-CM

## 2018-11-26 DIAGNOSIS — R339 Retention of urine, unspecified: Secondary | ICD-10-CM | POA: Diagnosis present

## 2018-11-26 LAB — COMPREHENSIVE METABOLIC PANEL
ALT: 7 U/L (ref 0–44)
AST: 21 U/L (ref 15–41)
Albumin: 3.3 g/dL — ABNORMAL LOW (ref 3.5–5.0)
Alkaline Phosphatase: 65 U/L (ref 38–126)
Anion gap: 8 (ref 5–15)
BUN: 26 mg/dL — ABNORMAL HIGH (ref 8–23)
CO2: 23 mmol/L (ref 22–32)
Calcium: 8.9 mg/dL (ref 8.9–10.3)
Chloride: 106 mmol/L (ref 98–111)
Creatinine, Ser: 0.89 mg/dL (ref 0.61–1.24)
GFR calc Af Amer: >60 mL/min (ref >60)
GFR calc non Af Amer: >60 mL/min (ref >60)
Glucose, Bld: 100 mg/dL — ABNORMAL HIGH (ref 70–99)
Potassium: 3.8 mmol/L (ref 3.5–5.1)
Sodium: 137 mmol/L (ref 135–145)
Total Bilirubin: 0.4 mg/dL (ref 0.3–1.2)
Total Protein: 7.2 g/dL (ref 6.5–8.1)

## 2018-11-26 LAB — CBC WITH DIFFERENTIAL/PLATELET
Abs Immature Granulocytes: 0.02 10*3/uL (ref 0.00–0.07)
Basophils Absolute: 0 10*3/uL (ref 0.0–0.1)
Basophils Relative: 0 %
Eosinophils Absolute: 0.1 10*3/uL (ref 0.0–0.5)
Eosinophils Relative: 2 %
HCT: 35.7 % — ABNORMAL LOW (ref 39.0–52.0)
Hemoglobin: 11.6 g/dL — ABNORMAL LOW (ref 13.0–17.0)
Immature Granulocytes: 0 %
Lymphocytes Relative: 41 %
Lymphs Abs: 3 10*3/uL (ref 0.7–4.0)
MCH: 32 pg (ref 26.0–34.0)
MCHC: 32.5 g/dL (ref 30.0–36.0)
MCV: 98.3 fL (ref 80.0–100.0)
Monocytes Absolute: 0.6 10*3/uL (ref 0.1–1.0)
Monocytes Relative: 9 %
Neutro Abs: 3.4 10*3/uL (ref 1.7–7.7)
Neutrophils Relative %: 48 %
Platelets: 304 10*3/uL (ref 150–400)
RBC: 3.63 MIL/uL — ABNORMAL LOW (ref 4.22–5.81)
RDW: 14.1 % (ref 11.5–15.5)
WBC: 7.3 10*3/uL (ref 4.0–10.5)
nRBC: 0 % (ref 0.0–0.2)

## 2018-11-26 LAB — URINALYSIS, ROUTINE W REFLEX MICROSCOPIC
Bilirubin Urine: NEGATIVE
Glucose, UA: NEGATIVE mg/dL
Hgb urine dipstick: NEGATIVE
Ketones, ur: NEGATIVE mg/dL
Nitrite: NEGATIVE
Protein, ur: 30 mg/dL — AB
RBC / HPF: >50 RBC/hpf — ABNORMAL HIGH (ref 0–5)
Specific Gravity, Urine: 1.015 (ref 1.005–1.030)
pH: 8 (ref 5.0–8.0)

## 2018-11-26 MED ORDER — SODIUM CHLORIDE 0.9 % IV BOLUS
1000.0000 mL | Freq: Once | INTRAVENOUS | Status: AC
  Administered 2018-11-26: 22:00:00 1000 mL via INTRAVENOUS

## 2018-11-26 MED ORDER — MORPHINE SULFATE (PF) 4 MG/ML IV SOLN
4.0000 mg | Freq: Once | INTRAVENOUS | Status: AC
  Administered 2018-11-26: 22:00:00 4 mg via INTRAVENOUS
  Filled 2018-11-26: qty 1

## 2018-11-26 MED ORDER — ONDANSETRON HCL 4 MG/2ML IJ SOLN
4.0000 mg | Freq: Once | INTRAMUSCULAR | Status: AC
  Administered 2018-11-26: 4 mg via INTRAVENOUS
  Filled 2018-11-26: qty 2

## 2018-11-26 MED ORDER — SODIUM CHLORIDE 0.9 % IV SOLN
1.0000 g | Freq: Once | INTRAVENOUS | Status: AC
  Administered 2018-11-26: 1 g via INTRAVENOUS
  Filled 2018-11-26: qty 10

## 2018-11-26 MED ORDER — CEPHALEXIN 500 MG PO CAPS: 500.0000 mg | ORAL_CAPSULE | Freq: Four times a day (QID) | ORAL | 0 refills | Status: DC

## 2018-11-26 NOTE — ED Notes (Signed)
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 MD made aware. MD attempted to place foley but unsuccessful.

## 2018-11-26 NOTE — ED Triage Notes (Signed)
Pt BIB EMS from home. 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

## 2018-11-26 NOTE — ED Provider Notes (Addendum)
[facility]-EMERGENCY DEPT Provider Note   CSN: [id number] Arrival date & time: 11/26/18  1954     History   Chief Complaint Chief Complaint  Patient presents with  . Catheter Problems    HPI Mark Mathews is a 78 y.o. male.     Pt presents to the ED today with a foley catheter problem.  The pt has severe parkinson's and contractures.  The pt has an indwelling foley catheter.  The pt's home health care nurse came to pt's house and took out his foley and tried to change the foley.  The foley was unable to be placed as it went through another hole at the base of his penis.  Pt is wearing a diaper and it looks like there is urine in his diaper.     Past Medical History:  Diagnosis Date  . Other and unspecified hyperlipidemia   . Paralysis agitans (HCC)   . Parkinson's disease (HCC) 10/12/2012  . Spinal stenosis     Patient Active Problem List   Diagnosis Date Noted  . Palliative care by specialist   . Wound infection   . Dementia with behavioral disturbance (HCC)   . Malnutrition of moderate degree 04/06/2018  . Pressure injury of skin 04/06/2018  . Severe sepsis (HCC) 04/02/2018  . Sacral wound 04/02/2018  . Abdominal tenderness 04/02/2018  . Chronic anemia 04/02/2018  . Goals of care, counseling/discussion   . Palliative care encounter   . Closed compression fracture of L3 lumbar vertebra 04/09/2016  . Parkinson's disease (HCC) 10/12/2012    Past Surgical History:  Procedure Laterality Date  . APPENDECTOMY    . CATARACT EXTRACTION  2016   x3  . INCISION AND DRAINAGE ABSCESS N/A 04/02/2018   Procedure: INCISION AND DRAINAGE debridement of sacral decubitis wound;  Surgeon: Andria Meuse, MD;  Location: [facility] OR;  Service: General;  Laterality: N/A;  . INGUINAL HERNIA REPAIR Bilateral 03/06/2009  . KIDNEY SURGERY          Home Medications    Prior to Admission medications   Medication Sig Start Date End Date Taking? Authorizing  Provider  acetaminophen (TYLENOL) 500 MG tablet Take 2 tablets (1,000 mg total) by mouth every 8 (eight) hours as needed. Patient taking differently: Take 1,000 mg by mouth every 8 (eight) hours as needed for moderate pain.  04/09/18  Yes Ghimire, Werner Lean, MD  carbidopa-levodopa (SINEMET IR) 25-100 MG tablet TAKE ONE AND ONE-HALF TABLETS FIVE TIMES DAILY Patient taking differently: Take 1.5 tablets by mouth 5 (five) times daily.  03/10/18  Yes Huston Foley, MD  cholecalciferol (VITAMIN D3) 25 MCG (1000 UT) tablet Take 1,000 Units by mouth daily.   Yes [provider]  feeding supplement, ENSURE ENLIVE, (ENSURE ENLIVE) LIQD Take 237 mLs by mouth 2 (two) times daily between meals. 04/09/18  Yes Ghimire, Werner Lean, MD  oxyCODONE (OXY IR/ROXICODONE) 5 MG immediate release tablet Take 1 tablet (5 mg total) by mouth every 6 (six) hours as needed for moderate pain. 04/09/18  Yes Ghimire, Werner Lean, MD  trihexyphenidyl (ARTANE) 2 MG tablet Take 0.5 tablets (1 mg total) by mouth 3 (three) times daily with meals. 11/24/17  Yes Huston Foley, MD  Amino Acids-Protein Hydrolys (FEEDING SUPPLEMENT, PRO-STAT SUGAR FREE 64,) LIQD Take 30 mLs by mouth 2 (two) times daily. Patient not taking: Reported on 11/26/2018 04/09/18   Maretta Bees, MD  cephALEXin (KEFLEX) 500 MG capsule Take 1 capsule (500 mg total) by mouth  4 (four) times daily. 11/26/18   Isla Pence, MD  entacapone (COMTAN) 200 MG tablet Take 1 tablet (200 mg total) by mouth 5 (five) times daily. In lieu of Stalevo 150 mg Patient not taking: Reported on 11/26/2018 02/17/17   Star Age, MD    Family History Family History  Problem Relation Age of Onset  . Heart failure Father     Social History Social History   Tobacco Use  . Smoking status: Never Smoker  . Smokeless tobacco: Never Used  Substance Use Topics  . Alcohol use: No    Alcohol/week: 0.0 standard drinks  . Drug use: No     Allergies   Patient has no known allergies.    Review of Systems Review of Systems  Genitourinary: Positive for difficulty urinating and penile pain.     Physical Exam Updated Vital Signs BP 119/76   Pulse 68   Temp 97.6 F (36.4 C)   Resp 16   SpO2 99%   Physical Exam Vitals signs and nursing note reviewed.  Constitutional:      Comments: Chronically ill appearing  HENT:     Head: Normocephalic and atraumatic.     Right Ear: External ear normal.     Left Ear: External ear normal.     Nose: Nose normal.     Mouth/Throat:     Mouth: Mucous membranes are moist.     Pharynx: Oropharynx is clear.  Eyes:     Extraocular Movements: Extraocular movements intact.     Conjunctiva/sclera: Conjunctivae normal.     Pupils: Pupils are equal, round, and reactive to light.  Neck:     Musculoskeletal: Normal range of motion and neck supple.  Cardiovascular:     Rate and Rhythm: Normal rate and regular rhythm.     Pulses: Normal pulses.     Heart sounds: Normal heart sounds.  Pulmonary:     Effort: Pulmonary effort is normal.     Breath sounds: Normal breath sounds.  Abdominal:     General: Abdomen is flat. Bowel sounds are normal.     Palpations: Abdomen is soft.  Genitourinary:    Comments: See picture Skin:    General: Skin is warm.     Capillary Refill: Capillary refill takes less than 2 seconds.  Neurological:     Mental Status: He is alert. Mental status is at baseline.     Motor: Tremor present.     Comments: Lower extremity contractures      ED Treatments / Results  Labs (all labs ordered are listed, but only abnormal results are displayed) Labs Reviewed  URINALYSIS, ROUTINE W REFLEX MICROSCOPIC - Abnormal; Notable for the following components:      Result Value   APPearance CLOUDY (*)    Protein, ur 30 (*)    Leukocytes,Ua LARGE (*)    RBC / HPF >50 (*)    Bacteria, UA RARE (*)    All other components within normal limits  COMPREHENSIVE METABOLIC PANEL - Abnormal; Notable for the following components:    Glucose, Bld 100 (*)    BUN 26 (*)    Albumin 3.3 (*)    All other components within normal limits  CBC WITH DIFFERENTIAL/PLATELET - Abnormal; Notable for the following components:   RBC 3.63 (*)    Hemoglobin 11.6 (*)    HCT 35.7 (*)    All other components within normal limits  URINE CULTURE    EKG None  Radiology No results found.  Procedures BLADDER CATHETERIZATION  Date/Time: 11/26/2018 11:12 PM Performed by: Jacalyn LefevreHaviland, Razan Siler, MD Authorized by: Jacalyn LefevreHaviland, Vernis Cabacungan, MD   Consent:    Consent obtained:  Verbal   Consent given by:  Patient   Risks discussed:  False passage, incomplete procedure and pain   Alternatives discussed:  No treatment Anesthesia (see MAR for exact dosages):    Anesthesia method:  None Procedure details:    Provider performed due to:  Complicated insertion and altered anatomy   Altered anatomy:  Trauma   Catheter insertion:  Indwelling   Catheter type:  Foley   Catheter size:  16 Fr   Number of attempts:  1   Urine characteristics:  Cloudy Post-procedure details:    Patient tolerance of procedure:  Tolerated well, no immediate complications   (including critical care time)  Medications Ordered in ED Medications  cefTRIAXone (ROCEPHIN) 1 g in sodium chloride 0.9 % 100 mL IVPB (has no administration in time range)  sodium chloride 0.9 % bolus 1,000 mL (1,000 mLs Intravenous New Bag/Given (Non-Interop) 11/26/18 2213)  morphine 4 MG/ML injection 4 mg (4 mg Intravenous Given 11/26/18 2213)  ondansetron (ZOFRAN) injection 4 mg (4 mg Intravenous Given 11/26/18 2213)     Initial Impression / Assessment and Plan / ED Course  I have reviewed the triage vital signs and the nursing notes.  Pertinent labs & imaging results that were available during my care of the patient were reviewed by me and considered in my medical decision making (see chart for details).      Foley unable to be passed with the hole at the base of his penis.    Pt d/w Dr.  Ronne BinningMcKenzie (urology).  He recommended placing a foley through the base of his penis.  I spoke with his son who agreed with plan and the foley was placed.    Pt does have a UTI.  He is given a dose of rocephin IV and d/c home with keflex.  Return if worse.   Final Clinical Impressions(s) / ED Diagnoses   Final diagnoses:  Malfunction of Foley catheter, initial encounter [facility](HCC)  Cystitis    ED Discharge Orders         Ordered    cephALEXin (KEFLEX) 500 MG capsule  4 times daily     11/26/18 2343           Jacalyn LefevreHaviland, Orie Baxendale, MD 11/26/18 2314    Jacalyn LefevreHaviland, Monserrath Junio, MD 11/26/18 [phone]

## 2018-11-26 NOTE — Telephone Encounter (Signed)
I called pt's home number, left a message asking him to call us back. If pt calls back please schedule him an appt with Dr. Rexene Alberts. This may be a doxy.me visit as well.

## 2018-11-26 NOTE — Telephone Encounter (Signed)
Dr Selinda Flavin Buster(son not on DPR) has called to inform the the wife of pt passed about 3 weeks ago.  Son is asking if he could speak with Dr Rexene Alberts re: the care of pt since the one person on the DPR has passed.  The terms of the DPR were explained to son.  He stated that we have to start somewhere, Son left his cell# to be reached on [phone] or his office # [phone]

## 2018-11-26 NOTE — Telephone Encounter (Signed)
Please call patient regarding a follow-up appointment.  I do not see where one is pending. I would recommend that patient and his son come together.  I am sorry to hear about his wife's passing.

## 2018-11-26 NOTE — ED Notes (Signed)
Bladder scan showed 9mL

## 2018-11-27 NOTE — ED Notes (Signed)
[facility] called  

## 2018-11-28 LAB — URINE CULTURE: Culture: 50000 — AB

## 2018-11-30 ENCOUNTER — Telehealth: Payer: Self-pay | Admitting: *Deleted

## 2018-11-30 NOTE — Telephone Encounter (Signed)
Post ED Visit - Positive Culture Follow-up  Culture report reviewed by antimicrobial stewardship pharmacist: [place] Team []  Elenor Quinones, Pharm.D. []  Heide Guile, Pharm.D., BCPS AQ-ID []  Parks Neptune, Pharm.D., BCPS []  Alycia Rossetti, Pharm.D., BCPS []  Burns, Pharm.D., BCPS, AAHIVP []  Legrand Como, Pharm.D., BCPS, AAHIVP []  Salome Arnt, PharmD, BCPS []  Johnnette Gourd, PharmD, BCPS []  Hughes Better, PharmD, BCPS []  Leeroy Cha, PharmD []  Laqueta Linden, PharmD, BCPS []  Albertina Parr, PharmD  [place] Team []  Leodis Sias, PharmD [x]  Lindell Spar, PharmD []  Royetta Asal, PharmD []  Graylin Shiver, Rph []  Rema Fendt) Glennon Mac, PharmD []  Arlyn Dunning, PharmD []  Netta Cedars, PharmD []  Dia Sitter, PharmD []  Leone Haven, PharmD []  Gretta Arab, PharmD []  Theodis Shove, PharmD []  Peggyann Juba, PharmD []  Reuel Boom, PharmD   Positive urine culture Treated with Cephalexin, organism sensitive to the same and no further patient follow-up is required at this time.  Harlon Flor [facility] 11/30/2018, 4:39 PM

## 2018-12-01 NOTE — Telephone Encounter (Signed)
I called pt again to discuss. No answer, left a message asking him to call us back. If pt calls back please schedule him an appointment with Dr. Rexene Alberts. This may be a doxy.me visit with the pt as well.

## 2018-12-07 NOTE — Telephone Encounter (Signed)
I called pt's son, pt and son are not able to do a VV this afternoon. He will call us back when he is in town again. When pt's son calls back please schedule him for a doxy.me visit with Dr. Rexene Alberts.

## 2018-12-07 NOTE — Telephone Encounter (Signed)
Pt son        Dr Selinda Flavin Easterly(son not on [facility])        Has called and is asking if there is a way Dr Rexene Alberts could do a virtual with pt either this afternoon or tomorrow before he leaves to go back out of town.  Son can be reached at [phone]

## 2018-12-21 ENCOUNTER — Telehealth: Payer: Self-pay

## 2018-12-21 NOTE — Telephone Encounter (Signed)
TC placed to patient's wife to offer to schedule a visit with Palliative Care. Unable to leave message as mail box is full

## 2018-12-28 ENCOUNTER — Telehealth: Payer: Self-pay

## 2018-12-28 NOTE — Telephone Encounter (Signed)
Phone call placed to patient to schedule visit with Palliative Care. Phone rang with no answer and no ability to leave a VM

## 2018-12-30 ENCOUNTER — Telehealth: Payer: Self-pay | Admitting: Internal Medicine

## 2018-12-30 NOTE — Telephone Encounter (Signed)
Placed telephone call to offer palliative care visit. There was no answer and unable to leave voicemail.

## 2019-01-05 ENCOUNTER — Telehealth: Payer: Self-pay

## 2019-01-05 NOTE — Telephone Encounter (Signed)
Attempted to reach patient and wife on numbers provided. Messages received that numbers are not In service. Phone call placed to son. VM left.

## 2019-01-15 ENCOUNTER — Inpatient Hospital Stay (HOSPITAL_COMMUNITY): Payer: Medicare Other

## 2019-01-15 ENCOUNTER — Inpatient Hospital Stay (HOSPITAL_COMMUNITY)
Admission: EM | Admit: 2019-01-15 | Discharge: 2019-01-23 | DRG: 698 | Disposition: A | Discharge status: Home Health | Payer: Medicare Other | Attending: Internal Medicine | Admitting: Internal Medicine

## 2019-01-15 ENCOUNTER — Other Ambulatory Visit: Payer: Self-pay

## 2019-01-15 ENCOUNTER — Encounter (HOSPITAL_COMMUNITY): Payer: Self-pay

## 2019-01-15 ENCOUNTER — Emergency Department (HOSPITAL_COMMUNITY): Payer: Medicare Other

## 2019-01-15 DIAGNOSIS — Z87440 Personal history of urinary (tract) infections: Secondary | ICD-10-CM

## 2019-01-15 DIAGNOSIS — Y846 Urinary catheterization as the cause of abnormal reaction of the patient, or of later complication, without mention of misadventure at the time of the procedure: Secondary | ICD-10-CM | POA: Diagnosis present

## 2019-01-15 DIAGNOSIS — N179 Acute kidney failure, unspecified: Secondary | ICD-10-CM | POA: Diagnosis present

## 2019-01-15 DIAGNOSIS — G9340 Encephalopathy, unspecified: Secondary | ICD-10-CM

## 2019-01-15 DIAGNOSIS — R202 Paresthesia of skin: Secondary | ICD-10-CM | POA: Diagnosis present

## 2019-01-15 DIAGNOSIS — D649 Anemia, unspecified: Secondary | ICD-10-CM | POA: Diagnosis present

## 2019-01-15 DIAGNOSIS — Z7401 Bed confinement status: Secondary | ICD-10-CM

## 2019-01-15 DIAGNOSIS — K5641 Fecal impaction: Secondary | ICD-10-CM | POA: Diagnosis present

## 2019-01-15 DIAGNOSIS — M8580 Other specified disorders of bone density and structure, unspecified site: Secondary | ICD-10-CM | POA: Diagnosis present

## 2019-01-15 DIAGNOSIS — G9341 Metabolic encephalopathy: Secondary | ICD-10-CM | POA: Diagnosis present

## 2019-01-15 DIAGNOSIS — Z978 Presence of other specified devices: Secondary | ICD-10-CM | POA: Diagnosis not present

## 2019-01-15 DIAGNOSIS — Z79899 Other long term (current) drug therapy: Secondary | ICD-10-CM | POA: Diagnosis not present

## 2019-01-15 DIAGNOSIS — K5289 Other specified noninfective gastroenteritis and colitis: Secondary | ICD-10-CM | POA: Diagnosis present

## 2019-01-15 DIAGNOSIS — R509 Fever, unspecified: Secondary | ICD-10-CM

## 2019-01-15 DIAGNOSIS — G20 Parkinson's disease: Secondary | ICD-10-CM | POA: Diagnosis present

## 2019-01-15 DIAGNOSIS — I452 Bifascicular block: Secondary | ICD-10-CM | POA: Diagnosis present

## 2019-01-15 DIAGNOSIS — R7881 Bacteremia: Secondary | ICD-10-CM | POA: Diagnosis not present

## 2019-01-15 DIAGNOSIS — A419 Sepsis, unspecified organism: Secondary | ICD-10-CM | POA: Diagnosis present

## 2019-01-15 DIAGNOSIS — Z66 Do not resuscitate: Secondary | ICD-10-CM | POA: Diagnosis present

## 2019-01-15 DIAGNOSIS — Z8249 Family history of ischemic heart disease and other diseases of the circulatory system: Secondary | ICD-10-CM | POA: Diagnosis not present

## 2019-01-15 DIAGNOSIS — I959 Hypotension, unspecified: Secondary | ICD-10-CM | POA: Diagnosis present

## 2019-01-15 DIAGNOSIS — L8990 Pressure ulcer of unspecified site, unspecified stage: Secondary | ICD-10-CM | POA: Diagnosis present

## 2019-01-15 DIAGNOSIS — N136 Pyonephrosis: Secondary | ICD-10-CM | POA: Diagnosis present

## 2019-01-15 DIAGNOSIS — F0391 Unspecified dementia with behavioral disturbance: Secondary | ICD-10-CM | POA: Diagnosis present

## 2019-01-15 DIAGNOSIS — A4159 Other Gram-negative sepsis: Secondary | ICD-10-CM | POA: Diagnosis present

## 2019-01-15 DIAGNOSIS — A498 Other bacterial infections of unspecified site: Secondary | ICD-10-CM | POA: Diagnosis not present

## 2019-01-15 DIAGNOSIS — T83518A Infection and inflammatory reaction due to other urinary catheter, initial encounter: Principal | ICD-10-CM | POA: Diagnosis present

## 2019-01-15 DIAGNOSIS — R400 Somnolence: Secondary | ICD-10-CM | POA: Diagnosis not present

## 2019-01-15 DIAGNOSIS — E785 Hyperlipidemia, unspecified: Secondary | ICD-10-CM | POA: Diagnosis present

## 2019-01-15 DIAGNOSIS — F03918 Unspecified dementia, unspecified severity, with other behavioral disturbance: Secondary | ICD-10-CM | POA: Diagnosis present

## 2019-01-15 DIAGNOSIS — Z20828 Contact with and (suspected) exposure to other viral communicable diseases: Secondary | ICD-10-CM | POA: Diagnosis present

## 2019-01-15 DIAGNOSIS — R159 Full incontinence of feces: Secondary | ICD-10-CM | POA: Diagnosis present

## 2019-01-15 DIAGNOSIS — F0281 Dementia in other diseases classified elsewhere with behavioral disturbance: Secondary | ICD-10-CM | POA: Diagnosis not present

## 2019-01-15 DIAGNOSIS — R6520 Severe sepsis without septic shock: Secondary | ICD-10-CM | POA: Diagnosis present

## 2019-01-15 DIAGNOSIS — K5900 Constipation, unspecified: Secondary | ICD-10-CM

## 2019-01-15 DIAGNOSIS — T83031A Leakage of indwelling urethral catheter, initial encounter: Secondary | ICD-10-CM | POA: Diagnosis present

## 2019-01-15 DIAGNOSIS — L89153 Pressure ulcer of sacral region, stage 3: Secondary | ICD-10-CM | POA: Diagnosis present

## 2019-01-15 LAB — CBC WITH DIFFERENTIAL/PLATELET
Abs Immature Granulocytes: 0.01 10*3/uL (ref 0.00–0.07)
Basophils Absolute: 0 10*3/uL (ref 0.0–0.1)
Basophils Relative: 0 %
Eosinophils Absolute: 0 10*3/uL (ref 0.0–0.5)
Eosinophils Relative: 0 %
HCT: 37.6 % — ABNORMAL LOW (ref 39.0–52.0)
Hemoglobin: 11.9 g/dL — ABNORMAL LOW (ref 13.0–17.0)
Immature Granulocytes: 0 %
Lymphocytes Relative: 6 %
Lymphs Abs: 0.4 10*3/uL — ABNORMAL LOW (ref 0.7–4.0)
MCH: 31.6 pg (ref 26.0–34.0)
MCHC: 31.6 g/dL (ref 30.0–36.0)
MCV: 100 fL (ref 80.0–100.0)
Monocytes Absolute: 0 10*3/uL — ABNORMAL LOW (ref 0.1–1.0)
Monocytes Relative: 0 %
Neutro Abs: 5.4 10*3/uL (ref 1.7–7.7)
Neutrophils Relative %: 94 %
Platelets: 269 10*3/uL (ref 150–400)
RBC: 3.76 MIL/uL — ABNORMAL LOW (ref 4.22–5.81)
RDW: 12.6 % (ref 11.5–15.5)
WBC: 5.8 10*3/uL (ref 4.0–10.5)
nRBC: 0 % (ref 0.0–0.2)

## 2019-01-15 LAB — COMPREHENSIVE METABOLIC PANEL
ALT: 12 U/L (ref 0–44)
AST: 45 U/L — ABNORMAL HIGH (ref 15–41)
Albumin: 3.2 g/dL — ABNORMAL LOW (ref 3.5–5.0)
Alkaline Phosphatase: 55 U/L (ref 38–126)
Anion gap: 11 (ref 5–15)
BUN: 37 mg/dL — ABNORMAL HIGH (ref 8–23)
CO2: 18 mmol/L — ABNORMAL LOW (ref 22–32)
Calcium: 8.8 mg/dL — ABNORMAL LOW (ref 8.9–10.3)
Chloride: 112 mmol/L — ABNORMAL HIGH (ref 98–111)
Creatinine, Ser: 1.22 mg/dL (ref 0.61–1.24)
GFR calc Af Amer: >60 mL/min (ref >60)
GFR calc non Af Amer: 56 mL/min — ABNORMAL LOW (ref >60)
Glucose, Bld: 94 mg/dL (ref 70–99)
Potassium: 4.3 mmol/L (ref 3.5–5.1)
Sodium: 141 mmol/L (ref 135–145)
Total Bilirubin: 0.9 mg/dL (ref 0.3–1.2)
Total Protein: 7 g/dL (ref 6.5–8.1)

## 2019-01-15 LAB — BLOOD GAS, ARTERIAL
Acid-base deficit: 1.9 mmol/L (ref 0.0–2.0)
Bicarbonate: 19.3 mmol/L — ABNORMAL LOW (ref 20.0–28.0)
O2 Saturation: 96.2 %
Patient temperature: 98.7
pCO2 arterial: 24.6 mmHg — ABNORMAL LOW (ref 32.0–48.0)
pH, Arterial: 7.505 — ABNORMAL HIGH (ref 7.350–7.450)
pO2, Arterial: 82.2 mmHg — ABNORMAL LOW (ref 83.0–108.0)

## 2019-01-15 LAB — CBC
HCT: 33.5 % — ABNORMAL LOW (ref 39.0–52.0)
Hemoglobin: 10.6 g/dL — ABNORMAL LOW (ref 13.0–17.0)
MCH: 32.5 pg (ref 26.0–34.0)
MCHC: 31.6 g/dL (ref 30.0–36.0)
MCV: 102.8 fL — ABNORMAL HIGH (ref 80.0–100.0)
Platelets: 205 10*3/uL (ref 150–400)
RBC: 3.26 MIL/uL — ABNORMAL LOW (ref 4.22–5.81)
RDW: 12.9 % (ref 11.5–15.5)
WBC: 30.5 10*3/uL — ABNORMAL HIGH (ref 4.0–10.5)
nRBC: 0 % (ref 0.0–0.2)

## 2019-01-15 LAB — SARS CORONAVIRUS 2 (TAT 6-24 HRS): SARS Coronavirus 2: NEGATIVE

## 2019-01-15 LAB — LACTIC ACID, PLASMA
Lactic Acid, Venous: 4.3 mmol/L (ref 0.5–1.9)
Lactic Acid, Venous: 2.5 mmol/L (ref 0.5–1.9)
Lactic Acid, Venous: 3.1 mmol/L (ref 0.5–1.9)
Lactic Acid, Venous: 1.8 mmol/L (ref 0.5–1.9)

## 2019-01-15 LAB — CREATININE, SERUM
Creatinine, Ser: 1.22 mg/dL (ref 0.61–1.24)
GFR calc Af Amer: >60 mL/min (ref >60)
GFR calc non Af Amer: 56 mL/min — ABNORMAL LOW (ref >60)

## 2019-01-15 LAB — BRAIN NATRIURETIC PEPTIDE: B Natriuretic Peptide: 229.9 pg/mL — ABNORMAL HIGH (ref 0.0–100.0)

## 2019-01-15 LAB — PROTIME-INR
INR: 1.2 (ref 0.8–1.2)
Prothrombin Time: 15.1 seconds (ref 11.4–15.2)

## 2019-01-15 LAB — APTT: aPTT: 29 seconds (ref 24–36)

## 2019-01-15 MED ORDER — IOHEXOL 350 MG/ML SOLN: 100.0000 mL | Freq: Once | INTRAVENOUS | Status: DC | PRN

## 2019-01-15 MED ORDER — SODIUM CHLORIDE 0.9 % IV SOLN
2.0000 g | Freq: Once | INTRAVENOUS | Status: AC
  Administered 2019-01-15: 13:00:00 2 g via INTRAVENOUS
  Filled 2019-01-15: qty 2

## 2019-01-15 MED ORDER — SODIUM CHLORIDE 0.9 % IV SOLN
INTRAVENOUS | Status: AC
  Administered 2019-01-15: 20:00:00 via INTRAVENOUS

## 2019-01-15 MED ORDER — ENOXAPARIN SODIUM 40 MG/0.4ML ~~LOC~~ SOLN
40.0000 mg | SUBCUTANEOUS | Status: DC
  Administered 2019-01-16 – 2019-01-22 (×7): 40 mg via SUBCUTANEOUS
  Filled 2019-01-15 (×8): qty 0.4

## 2019-01-15 MED ORDER — MORPHINE SULFATE (PF) 2 MG/ML IV SOLN
2.0000 mg | Freq: Once | INTRAVENOUS | Status: AC
  Administered 2019-01-15: 2 mg via INTRAVENOUS
  Filled 2019-01-15: qty 1

## 2019-01-15 MED ORDER — ACETAMINOPHEN 325 MG PO TABS
650.0000 mg | ORAL_TABLET | Freq: Four times a day (QID) | ORAL | Status: DC | PRN
  Administered 2019-01-17 – 2019-01-22 (×6): 650 mg via ORAL
  Filled 2019-01-15 (×6): qty 2

## 2019-01-15 MED ORDER — CARBIDOPA-LEVODOPA 25-100 MG PO TABS
1.5000 | ORAL_TABLET | Freq: Every day | ORAL | Status: DC
  Administered 2019-01-16 – 2019-01-21 (×26): 1.5 via ORAL
  Filled 2019-01-15: qty 2
  Filled 2019-01-15: qty 1.5
  Filled 2019-01-15: qty 2
  Filled 2019-01-15: qty 1.5
  Filled 2019-01-15: qty 2
  Filled 2019-01-15 (×2): qty 1.5
  Filled 2019-01-15: qty 2
  Filled 2019-01-15: qty 1.5
  Filled 2019-01-15 (×3): qty 2
  Filled 2019-01-15 (×2): qty 1.5
  Filled 2019-01-15: qty 2
  Filled 2019-01-15: qty 1.5
  Filled 2019-01-15 (×2): qty 2
  Filled 2019-01-15: qty 1.5
  Filled 2019-01-15 (×5): qty 2
  Filled 2019-01-15: qty 1.5
  Filled 2019-01-15 (×3): qty 2
  Filled 2019-01-15: qty 1.5
  Filled 2019-01-15: qty 2

## 2019-01-15 MED ORDER — IOHEXOL 300 MG/ML  SOLN
100.0000 mL | Freq: Once | INTRAMUSCULAR | Status: AC | PRN
  Administered 2019-01-15: 20:00:00 100 mL via INTRAVENOUS

## 2019-01-15 MED ORDER — LACTATED RINGERS IV BOLUS (SEPSIS)
1000.0000 mL | Freq: Once | INTRAVENOUS | Status: AC
  Administered 2019-01-15: 14:00:00 1000 mL via INTRAVENOUS

## 2019-01-15 MED ORDER — LACTATED RINGERS IV BOLUS (SEPSIS)
1000.0000 mL | Freq: Once | INTRAVENOUS | Status: AC
  Administered 2019-01-15: 13:00:00 1000 mL via INTRAVENOUS

## 2019-01-15 MED ORDER — VANCOMYCIN HCL IN DEXTROSE 750-5 MG/150ML-% IV SOLN: 750.0000 mg | INTRAVENOUS | Status: DC

## 2019-01-15 MED ORDER — ONDANSETRON HCL 4 MG PO TABS: 4.0000 mg | ORAL_TABLET | Freq: Four times a day (QID) | ORAL | Status: DC | PRN

## 2019-01-15 MED ORDER — METRONIDAZOLE IN NACL 5-0.79 MG/ML-% IV SOLN
500.0000 mg | Freq: Three times a day (TID) | INTRAVENOUS | Status: DC
  Administered 2019-01-15 – 2019-01-17 (×6): 500 mg via INTRAVENOUS
  Filled 2019-01-15 (×6): qty 100

## 2019-01-15 MED ORDER — SODIUM CHLORIDE 0.9 % IV SOLN: 2.0000 g | Freq: Once | INTRAVENOUS | Status: DC

## 2019-01-15 MED ORDER — VANCOMYCIN HCL IN DEXTROSE 1-5 GM/200ML-% IV SOLN
1000.0000 mg | Freq: Once | INTRAVENOUS | Status: AC
  Administered 2019-01-15: 1000 mg via INTRAVENOUS
  Filled 2019-01-15: qty 200

## 2019-01-15 MED ORDER — SODIUM CHLORIDE 0.9 % IV SOLN
2.0000 g | Freq: Two times a day (BID) | INTRAVENOUS | Status: DC
  Administered 2019-01-15: 2 g via INTRAVENOUS
  Filled 2019-01-15: qty 2

## 2019-01-15 MED ORDER — ONDANSETRON HCL 4 MG/2ML IJ SOLN: 4.0000 mg | Freq: Four times a day (QID) | INTRAMUSCULAR | Status: DC | PRN

## 2019-01-15 MED ORDER — VANCOMYCIN HCL IN DEXTROSE 1-5 GM/200ML-% IV SOLN: 1000.0000 mg | Freq: Once | INTRAVENOUS | Status: DC

## 2019-01-15 MED ORDER — ACETAMINOPHEN 650 MG RE SUPP: 650.0000 mg | Freq: Four times a day (QID) | RECTAL | Status: DC | PRN

## 2019-01-15 NOTE — ED Provider Notes (Signed)
[facility]-EMERGENCY DEPT Provider Note   CSN: [id number] Arrival date & time: 01/15/19  1140     History   Chief Complaint Chief Complaint  Patient presents with  . Fever    HPI Mark Mathews is a 78 y.o. male.     HPI Level 5 caveat due to altered mental status.  Brought in for fever and mental status change.  Reportedly alert and oriented and at baseline until today.  Found to have fever.  Has chronic Foley catheter.  Has chronic sacral ulcer.  Patient really cannot provide much history.  Has wound care nurse at times.  Reportedly has been on antibiotics that ended 2 weeks ago for infection of his pressure ulcer. Past Medical History:  Diagnosis Date  . Other and unspecified hyperlipidemia   . Paralysis agitans (HCC)   . Parkinson's disease (HCC) 10/12/2012  . Spinal stenosis     Patient Active Problem List   Diagnosis Date Noted  . Palliative care by specialist   . Wound infection   . Dementia with behavioral disturbance (HCC)   . Malnutrition of moderate degree 04/06/2018  . Pressure injury of skin 04/06/2018  . Severe sepsis (HCC) 04/02/2018  . Sacral wound 04/02/2018  . Abdominal tenderness 04/02/2018  . Chronic anemia 04/02/2018  . Goals of care, counseling/discussion   . Palliative care encounter   . Closed compression fracture of L3 lumbar vertebra 04/09/2016  . Parkinson's disease (HCC) 10/12/2012    Past Surgical History:  Procedure Laterality Date  . APPENDECTOMY    . CATARACT EXTRACTION  2016   x3  . INCISION AND DRAINAGE ABSCESS N/A 04/02/2018   Procedure: INCISION AND DRAINAGE debridement of sacral decubitis wound;  Surgeon: Andria MeuseWhite, Christopher M, MD;  Location: [facility] OR;  Service: General;  Laterality: N/A;  . INGUINAL HERNIA REPAIR Bilateral 03/06/2009  . KIDNEY SURGERY          Home Medications    Prior to Admission medications   Medication Sig Start Date End Date Taking? Authorizing Provider  acetaminophen (TYLENOL)  500 MG tablet Take 2 tablets (1,000 mg total) by mouth every 8 (eight) hours as needed. Patient taking differently: Take 1,000 mg by mouth every 8 (eight) hours as needed for moderate pain.  04/09/18  Yes Ghimire, Werner LeanShanker M, MD  carbidopa-levodopa (SINEMET IR) 25-100 MG tablet TAKE ONE AND ONE-HALF TABLETS FIVE TIMES DAILY Patient taking differently: Take 1.5 tablets by mouth 5 (five) times daily.  03/10/18  Yes Huston FoleyAthar, Saima, MD  feeding supplement (BOOST HIGH PROTEIN) LIQD Take 1 Container by mouth daily as needed (at patient request). Chocolate   Yes [provider]  oxyCODONE (OXY IR/ROXICODONE) 5 MG immediate release tablet Take 1 tablet (5 mg total) by mouth every 6 (six) hours as needed for moderate pain. 04/09/18  Yes Ghimire, Werner LeanShanker M, MD  polyvinyl alcohol (LIQUIFILM TEARS) 1.4 % ophthalmic solution Place 1 drop into both eyes as needed for dry eyes.   Yes [provider]  trihexyphenidyl (ARTANE) 2 MG tablet Take 0.5 tablets (1 mg total) by mouth 3 (three) times daily with meals. 11/24/17  Yes Huston FoleyAthar, Saima, MD  Amino Acids-Protein Hydrolys (FEEDING SUPPLEMENT, PRO-STAT SUGAR FREE 64,) LIQD Take 30 mLs by mouth 2 (two) times daily. Patient not taking: Reported on 11/26/2018 04/09/18   Maretta BeesGhimire, Shanker M, MD  cephALEXin (KEFLEX) 500 MG capsule Take 1 capsule (500 mg total) by mouth 4 (four) times daily. Patient not taking: Reported on 01/15/2019 11/26/18  Isla Pence, MD  entacapone (COMTAN) 200 MG tablet Take 1 tablet (200 mg total) by mouth 5 (five) times daily. In lieu of Stalevo 150 mg Patient not taking: Reported on 11/26/2018 02/17/17   Star Age, MD  feeding supplement, ENSURE ENLIVE, (ENSURE ENLIVE) LIQD Take 237 mLs by mouth 2 (two) times daily between meals. Patient not taking: Reported on 01/15/2019 04/09/18   Jonetta Osgood, MD    Family History Family History  Problem Relation Age of Onset  . Heart failure Father     Social History Social History    Tobacco Use  . Smoking status: Never Smoker  . Smokeless tobacco: Never Used  Substance Use Topics  . Alcohol use: No    Alcohol/week: 0.0 standard drinks  . Drug use: No     Allergies   Patient has no known allergies.   Review of Systems Review of Systems  Unable to perform ROS: Mental status change     Physical Exam Updated Vital Signs BP 103/66   Pulse 100   Temp (!) 103 F (39.4 C) (Rectal)   Resp 17   Ht 5\' 8"  (1.727 m)   Wt 61.2 kg   SpO2 100%   BMI 20.53 kg/m   Physical Exam Vitals signs and nursing note reviewed.  HENT:     Head: Atraumatic.  Eyes:     Pupils: Pupils are equal, round, and reactive to light.  Cardiovascular:     Rate and Rhythm: Tachycardia present.  Pulmonary:     Breath sounds: Rhonchi present.     Comments: Diffuse harsh breath sounds with some tachypnea. Abdominal:     Tenderness: There is no abdominal tenderness.  Genitourinary:    Comments: Sacral pressure ulcer with mild surrounding erythema.  Reportedly had been coated with stool.  Also has Foley catheter that inserts at the base of the penis. Skin:    General: Skin is warm.  Neurological:     Comments: Patient difficult to get history from.  Some confusion.  States that he feels bad but really cannot provide much other history.      ED Treatments / Results  Labs (all labs ordered are listed, but only abnormal results are displayed) Labs Reviewed  COMPREHENSIVE METABOLIC PANEL - Abnormal; Notable for the following components:      Result Value   Chloride 112 (*)    CO2 18 (*)    BUN 37 (*)    Calcium 8.8 (*)    Albumin 3.2 (*)    AST 45 (*)    GFR calc non Af Amer 56 (*)    All other components within normal limits  LACTIC ACID, PLASMA - Abnormal; Notable for the following components:   Lactic Acid, Venous 4.3 (*)    All other components within normal limits  LACTIC ACID, PLASMA - Abnormal; Notable for the following components:   Lactic Acid, Venous 2.5 (*)     All other components within normal limits  CBC WITH DIFFERENTIAL/PLATELET - Abnormal; Notable for the following components:   RBC 3.76 (*)    Hemoglobin 11.9 (*)    HCT 37.6 (*)    Lymphs Abs 0.4 (*)    Monocytes Absolute 0.0 (*)    All other components within normal limits  CULTURE, BLOOD (ROUTINE X 2)  CULTURE, BLOOD (ROUTINE X 2)  SARS CORONAVIRUS 2 (TAT 6-24 HRS)  URINE CULTURE  PROTIME-INR  APTT  URINALYSIS, ROUTINE W REFLEX MICROSCOPIC    EKG EKG Interpretation  Date/Time:  Friday January 15 2019 13:11:27 EST Ventricular Rate:  96 PR Interval:    QRS Duration: 116 QT Interval:  363 QTC Calculation: 459 R Axis:   -45 Text Interpretation: Sinus rhythm Incomplete RBBB and LAFB Low voltage, precordial leads Confirmed by Benjiman Core [phone]) on 01/15/2019 1:21:51 PM   Radiology Dg Chest Portable 1 View  Result Date: 01/15/2019 CLINICAL DATA:  Fever, altered mental status EXAM: PORTABLE CHEST 1 VIEW COMPARISON:  August 12, 2017 FINDINGS: Rotated. The extreme left costophrenic angle is excluded. No consolidation or edema. No pleural effusion. Likely normal heart size for portable technique. IMPRESSION: No acute process in the chest. Electronically Signed   By: Guadlupe Spanish M.D.   On: 01/15/2019 13:57    Procedures Procedures (including critical care time)  Medications Ordered in ED Medications  ceFEPIme (MAXIPIME) 2 g in sodium chloride 0.9 % 100 mL IVPB (0 g Intravenous Stopped 01/15/19 1348)  lactated ringers bolus 1,000 mL (0 mLs Intravenous Stopped 01/15/19 1419)    And  lactated ringers bolus 1,000 mL (1,000 mLs Intravenous Bolus from Bag 01/15/19 1420)  vancomycin (VANCOCIN) IVPB 1000 mg/200 mL premix (0 mg Intravenous Stopped 01/15/19 1505)     Initial Impression / Assessment and Plan / ED Course  I have reviewed the triage vital signs and the nursing notes.  Pertinent labs & imaging results that were available during my care of the patient were  reviewed by me and considered in my medical decision making (see chart for details).        Patient presents with fever and altered mental status.  Harsh breath sounds but x-ray reassuring.  Has chronic Foley catheter with some difficulty draining.  Also has decubitus ulcers that were reportedly covered in stool.  Initial lactic acid elevated.  Has come down with IV fluids.  Code sepsis had been called and given fluid bolus and given vancomycin and cefepime to cover both pulmonary and catheter associated UTI.  Urinalysis still pending.  Will admit to hospitalist.  CRITICAL CARE Performed by: Benjiman Core Total critical care time: 30 minutes Critical care time was exclusive of separately billable procedures and treating other patients. Critical care was necessary to treat or prevent imminent or life-threatening deterioration. Critical care was time spent personally by me on the following activities: development of treatment plan with patient and/or surrogate as well as nursing, discussions with consultants, evaluation of patient's response to treatment, examination of patient, obtaining history from patient or surrogate, ordering and performing treatments and interventions, ordering and review of laboratory studies, ordering and review of radiographic studies, pulse oximetry and re-evaluation of patient's condition.   Final Clinical Impressions(s) / ED Diagnoses   Final diagnoses:  Fever, unspecified fever cause  Encephalopathy acute    ED Discharge Orders    None       Benjiman Core, MD 01/15/19 1542

## 2019-01-15 NOTE — ED Notes (Signed)
Patients bed saturated with urine, catheter bag loosened from catheter. RN tightened bag on catheter, patient being cleaned up currently.

## 2019-01-15 NOTE — ED Triage Notes (Signed)
Per [facility], patient comes from home, patient's daughter called and stated patients extremities were purple in color. EMS states patient is febrile at 107. History of parkinsons and dementia along with chronic UTI. Patient ended antibiotic 2 weeks ago for pressure wound infection. Patient has indwelling catheter, family stated it was leaking and urine was dark.

## 2019-01-15 NOTE — Progress Notes (Signed)
ABG obtained on 2 liters oxygen. Sample sent to lab for analysis.

## 2019-01-15 NOTE — H&P (Signed)
History and Physical        Hospital Admission Note Date: 01/15/2019  Patient name: Mark Mathews Medical record number: [id number] Date of birth: 1941-01-15 Age: 78 y.o. Gender: male  PCP: Maurice Small, MD  Patient coming from: Home, lives with son and daughter-in-law  Chief Complaint    Altered mental status   HPI:   This is a 78 year old male with past medical history of Parkinson's, bedbound, chronic indwelling catheter UTI, sacral decubitus ulcer who presented to the ED today with altered mental status x1 day.  Patient unable to provide history, history obtained from son via phone.  Apparently patient has had a stable but decline over the past 1 year which significantly worsened over the past 12 hours.  Roughly 1 year ago patient was noted to be increasingly bedbound and began to have a sacral decubitus ulcer.  Patient son states that this was eventually operated on however did not completely resolve.  He stated the patient was eventually admitted to a facility and placed on hospice however several months ago was taken off hospice care and began living at home with son and daughter-in-law who take care of the patient's chronic conditions.   Additionally, on November 26, 2018, patient was noted to have a Foley catheter problem and was evaluated by urology.  At this time Dr. Ronne Binning, urology, recommended placing a Foley catheter through the base of the penis and was given Rocephin at that time for a UTI and discharged home with Keflex.  Bacteria at the time grew Proteus.  Also, patient's son stated the patient had a repeat UTI recently and was started on nitrofurantoin which she finished at the end of October.  Regarding patient's current admission, daughter-in-law noticed this morning that the patient was in the usual state of health however later in the morning the patient  began to have altered mental status as well as discoloration of his hands and legs described as purple.  Also noted the patient to be having increasing amounts of urine from around the catheter.  911 was called and EMS arrived and noted patient to have a fever of 107 F.  Patient was transferred to the hospital.  Upon presentation, patient was also noted to have stooled himself which was around his decubitus ulcer and social work was consulted for concern of neglect (please refer to case management note from today).     Review of Systems:  Review of Systems  Unable to perform ROS: Mental status change    ED Workup/Course   In the ED: A code sepsis was called.  He received 2 L lactated Ringer's bolus in the ED and started on cefepime as well as vancomycin.  Chest x-ray negative.  [facility] hospitalist was consulted.  Pertinent labs: Lactic acid 4.3-> 2.5-> 3.1, WBC 5.8, hemoglobin 11.9, initially febrile to 103 F, tachycardic up to 105, normotensive at 109/63, SPO2 100% on 2 L nasal cannula   Medical/Social/Family History   Past Medical History: Past Medical History:  Diagnosis Date  . Other and unspecified hyperlipidemia   . Paralysis agitans (HCC)   . Parkinson's disease (HCC) 10/12/2012  . Spinal stenosis     Past Surgical History:  Procedure Laterality Date  .  APPENDECTOMY    . CATARACT EXTRACTION  2016   x3  . INCISION AND DRAINAGE ABSCESS N/A 04/02/2018   Procedure: INCISION AND DRAINAGE debridement of sacral decubitis wound;  Surgeon: Andria MeuseWhite, Christopher M, MD;  Location: [facility] OR;  Service: General;  Laterality: N/A;  . INGUINAL HERNIA REPAIR Bilateral 03/06/2009  . KIDNEY SURGERY      Medications: Prior to Admission medications   Medication Sig Start Date End Date Taking? Authorizing Provider  acetaminophen (TYLENOL) 500 MG tablet Take 2 tablets (1,000 mg total) by mouth every 8 (eight) hours as needed. Patient taking differently: Take 1,000 mg by mouth every 8 (eight) hours as  needed for moderate pain.  04/09/18  Yes Ghimire, Werner LeanShanker M, MD  carbidopa-levodopa (SINEMET IR) 25-100 MG tablet TAKE ONE AND ONE-HALF TABLETS FIVE TIMES DAILY Patient taking differently: Take 1.5 tablets by mouth 5 (five) times daily.  03/10/18  Yes Huston FoleyAthar, Saima, MD  feeding supplement (BOOST HIGH PROTEIN) LIQD Take 1 Container by mouth daily as needed (at patient request). Chocolate   Yes [provider]  oxyCODONE (OXY IR/ROXICODONE) 5 MG immediate release tablet Take 1 tablet (5 mg total) by mouth every 6 (six) hours as needed for moderate pain. 04/09/18  Yes Ghimire, Werner LeanShanker M, MD  polyvinyl alcohol (LIQUIFILM TEARS) 1.4 % ophthalmic solution Place 1 drop into both eyes as needed for dry eyes.   Yes [provider]  trihexyphenidyl (ARTANE) 2 MG tablet Take 0.5 tablets (1 mg total) by mouth 3 (three) times daily with meals. 11/24/17  Yes Huston FoleyAthar, Saima, MD  Amino Acids-Protein Hydrolys (FEEDING SUPPLEMENT, PRO-STAT SUGAR FREE 64,) LIQD Take 30 mLs by mouth 2 (two) times daily. Patient not taking: Reported on 11/26/2018 04/09/18   Maretta BeesGhimire, Shanker M, MD  cephALEXin (KEFLEX) 500 MG capsule Take 1 capsule (500 mg total) by mouth 4 (four) times daily. Patient not taking: Reported on 01/15/2019 11/26/18   Jacalyn LefevreHaviland, Julie, MD  entacapone (COMTAN) 200 MG tablet Take 1 tablet (200 mg total) by mouth 5 (five) times daily. In lieu of Stalevo 150 mg Patient not taking: Reported on 11/26/2018 02/17/17   Huston FoleyAthar, Saima, MD  feeding supplement, ENSURE ENLIVE, (ENSURE ENLIVE) LIQD Take 237 mLs by mouth 2 (two) times daily between meals. Patient not taking: Reported on 01/15/2019 04/09/18   Maretta BeesGhimire, Shanker M, MD    Allergies:  No Known Allergies  Social History:  reports that he has never smoked. He has never used smokeless tobacco. He reports that he does not drink alcohol or use drugs.  Family History: Family History  Problem Relation Age of Onset  . Heart failure Father      Objective    Physical Exam: Blood pressure (!) 105/53, pulse 99, temperature (!) 101 F (38.3 C), temperature source Rectal, resp. rate 18, height 5\' 8"  (1.727 m), weight 61.2 kg, SpO2 99 %.  Physical Exam Vitals signs and nursing note reviewed.  Constitutional:      Appearance: He is toxic-appearing and diaphoretic.     Comments: Awake, diaphoretic, nonverbal  HENT:     Head: Normocephalic.     Mouth/Throat:     Mouth: Mucous membranes are moist.  Eyes:     Conjunctiva/sclera: Conjunctivae normal.  Cardiovascular:     Rate and Rhythm: Regular rhythm. Tachycardia present.     Heart sounds: No murmur.  Pulmonary:     Effort: No respiratory distress.     Breath sounds: No wheezing.     Comments: Tachypneic Abdominal:  General: Abdomen is flat. There is no distension.  Genitourinary:    Comments: Indwelling catheter noted below penis Musculoskeletal:     Right lower leg: No edema.     Left lower leg: No edema.     Comments: Not following commands Decubitus ulcer with dressing over top Stool noted around dressing as well as on hands  Skin:    General: Skin is warm.     Findings: No rash.  Neurological:     Mental Status: He is disoriented.     LABS on Admission: I have personally reviewed all the labs and imagings below    Basic Metabolic Panel: Recent Labs  Lab 01/15/19 1208  NA 141  K 4.3  CL 112*  CO2 18*  GLUCOSE 94  BUN 37*  CREATININE 1.22  CALCIUM 8.8*   Liver Function Tests: Recent Labs  Lab 01/15/19 1208  AST 45*  ALT 12  ALKPHOS 55  BILITOT 0.9  PROT 7.0  ALBUMIN 3.2*   No results for input(s): LIPASE, AMYLASE in the last 168 hours. No results for input(s): AMMONIA in the last 168 hours. CBC: Recent Labs  Lab 01/15/19 1208  WBC 5.8  NEUTROABS 5.4  HGB 11.9*  HCT 37.6*  MCV 100.0  PLT 269   Cardiac Enzymes: No results for input(s): CKTOTAL, CKMB, CKMBINDEX, TROPONINI in the last 168 hours. BNP: Invalid input(s): POCBNP CBG: No results  for input(s): GLUCAP in the last 168 hours.  Radiological Exams on Admission:  Ct Abdomen Pelvis W Contrast  Result Date: 01/15/2019 CLINICAL DATA:  Sepsis. Indwelling catheter and prior urinary tract infection. Diarrhea. EXAM: CT ABDOMEN AND PELVIS WITH CONTRAST TECHNIQUE: Multidetector CT imaging of the abdomen and pelvis was performed using the standard protocol following bolus administration of intravenous contrast. CONTRAST:  135mL OMNIPAQUE IOHEXOL 300 MG/ML  SOLN COMPARISON:  CT 04/02/2018 FINDINGS: Lower chest: Mild bilateral pleural thickening with trace left pleural effusion. Mild basilar atelectasis. Coronary artery calcifications. Partially motion obscured. Hepatobiliary: No focal liver abnormality is seen. No gallstones, gallbladder wall thickening, or biliary dilatation. Pancreas: Motion obscured.  No ductal dilatation or inflammation. Spleen: Normal in size without focal abnormality. Adrenals/Urinary Tract: No adrenal nodule. Bilateral hydroureteronephrosis, new from prior exam. There is right periureteric stranding and enhancement of the right renal collecting system. Both ureters are dilated to the bladder insertion. Bilateral perinephric edema. Diminished excretion on delayed phase imaging from both kidneys. Foley catheter within the urinary bladder. Diffuse urinary bladder wall thickening, enhancement, and perivesicular edema. Few scattered small bladder diverticula. Stomach/Bowel: Large volume of stool distends the rectum, rectal distention of 8.6 cm. Mild anorectal wall thickening and perirectal edema. No definite pneumatosis. Large volume of stool in the sigmoid colon. Distal descending colon is tortuous and decompressed. Small to moderate volume of stool throughout the remainder the colon. Appendix not visualized, surgically absent per history. Few fluid-filled small bowel loops without obstruction. Inflammatory changes in the right pericolic gutter, favored to be reactive secondary to  adjacent ureteral inflammation. Fluid-filled duodenum. Moderate hiatal hernia with small volume of fluid in the stomach. No obvious source of perforation. Vascular/Lymphatic: Moderate aorto bi-iliac atherosclerosis. Diffuse vascular tortuosity. Retroaortic left renal vein. Patent portal vein. Limited assessment for adenopathy given patient motion. No bulky abdominopelvic lymph nodes. Reproductive: Prostate is unremarkable. Other: Tiny focus of air adjacent to the gallbladder and left lobe of the liver, this abuts the hepatic flexure of the colon is felt to represent a colonic fold, series 2, image 28. Generalized retroperitoneal  stranding suggesting urinary tract infection. Prior lower abdominal wall hernia repair with tacks. No evidence of intra-abdominal abscess. Musculoskeletal: Unchanged moderately severe L3 compression fracture. Multilevel degenerative change in the spine. Bones diffusely under mineralized. Improve subcutaneous edema of the posterior soft tissues. Previous soft tissue gas in the retrosacral soft tissues has resolved, there is residual soft tissue thickening. IMPRESSION: 1. Findings consistent with urinary tract infection with cystitis and ascending urinary tract infection on the right and likely left. Inflammatory changes about the right renal collecting system. No renal abscess. 2. Large volume of stool in the distal sigmoid colon and rectum with mild anorectal wall thickening and perirectal/pericolonic edema, suspicious for fecal impaction or stercoral colitis. 3. Tiny focus of air adjacent to the gallbladder is felt to be contiguous with the hepatic flexure of the colon rather than a focus of free air, as there is no evidence of bowel perforation. 4. Mild bilateral pleural thickening with trace left pleural effusion. Mild basilar atelectasis. Aortic Atherosclerosis (ICD10-I70.0). Electronically Signed   By: Narda Rutherford M.D.   On: 01/15/2019 20:09   Dg Chest Portable 1 View  Result  Date: 01/15/2019 CLINICAL DATA:  Fever, altered mental status EXAM: PORTABLE CHEST 1 VIEW COMPARISON:  August 12, 2017 FINDINGS: Rotated. The extreme left costophrenic angle is excluded. No consolidation or edema. No pleural effusion. Likely normal heart size for portable technique. IMPRESSION: No acute process in the chest. Electronically Signed   By: Guadlupe Spanish M.D.   On: 01/15/2019 13:57      EKG: Independently reviewed.  Incomplete RBBB   A & P   Active Problems:   Severe sepsis (HCC)   Severe sepsis of unclear etiology, concern for C. difficile given recent antibiotics versus urinary source versus bacteremia versus sacral decubitus infection febrile, tachycardic, tachypneic, altered mental status, lactic acid 4.3-> 2.5-> 3.1, requiring supplemental oxygen.  Lactic acid may have increased after receiving lactated Ringer's.  Received vancomycin/cefepime in ED.  Received 2 L LR bolus.  Covid negative -Continue broad-spectrum antibiotics with cefepime/vancomycin/Flagyl -Normal saline maintenance fluid -Follow-up blood cultures x2 -UA -Check for C. Difficile: Diarrhea in setting of multiple recent antibiotics for UTI -Repeat lactic acid level -CT abdomen pelvis with contrast to look for infectious source -Consider urology consult in a.m. for catheter exchange pending further work-up -Wound care consult -N.p.o. -Consider palliative care consult for goals of care discussion.  Patient was previously on hospice.  DNR per son  Parkinson's continue Sinemet IR to prevent withdrawal.  Hold remaining home meds -Monitor  Recurrent UTI in setting of chronic indwelling catheter completed course of Keflex at the end of September and course of Macrobid at the end of October.  Proteus mirabilis UTI 11/26/2018 -Plan as above  AKI secondary to severe sepsis received 2 L LR bolus -NS maintenance fluid -Monitor for hyperchloremic acidosis  [place] metabolic acidosis secondary to sepsis, AKI and  hyperchloremia -Monitor  DVT prophylaxis: Lovenox   Code Status: DNR  Diet: N.p.o. Family Communication: Admission, patients condition and plan of care including tests being ordered have been discussed with the patient who indicates understanding and agrees with the plan and Code Status. Patient's family was updated by phone Disposition Plan: The appropriate patient status for this patient is INPATIENT. Inpatient status is judged to be reasonable and necessary in order to provide the required intensity of service to ensure the patient's safety. The patient's presenting symptoms, physical exam findings, and initial radiographic and laboratory data in the context of their chronic comorbidities  is felt to place them at high risk for further clinical deterioration. Furthermore, it is not anticipated that the patient will be medically stable for discharge from the hospital within 2 midnights of admission. The following factors support the patient status of inpatient.   " The patient's presenting symptoms include altered mental status, severe sepsis. " The worrisome physical exam findings include altered mental status, fever. " The initial radiographic and laboratory data are worrisome because of lactic acid 4.3. " The chronic co-morbidities include Parkinson's and chronic indwelling Foley catheter with recurrent UTI.   * I certify that at the point of admission it is my clinical judgment that the patient will require inpatient hospital care spanning beyond 2 midnights from the point of admission due to high intensity of service, high risk for further deterioration and high frequency of surveillance required.*    The medical decision making on this patient was of high complexity and the patient is at high risk for clinical deterioration, therefore this is a level 3  admission.  Consultants  . None  Procedures  . None  Time Spent on Admission: 65 minutes with greater than 50% time coordinating  patient care   Jae Dire, DO [facility] 01/15/2019, 8:40 PM

## 2019-01-15 NOTE — ED Notes (Signed)
ED TO INPATIENT HANDOFF REPORT  ED Nurse Name and Phone #:   S Name/Age/Gender Mark Mathews 78 y.o. male Room/Bed: WA23/WA23  Code Status   Code Status: Prior  Home/SNF/Other Home Patient nonverbal Is this baseline? No   Triage Complete: Triage complete  Chief Complaint fever possible sepsis  Triage Note Per Ems, patient comes from home, patient's daughter called and stated patients extremities were purple in color. EMS states patient is febrile at 107. History of parkinsons and dementia along with chronic UTI. Patient ended antibiotic 2 weeks ago for pressure wound infection. Patient has indwelling catheter, family stated it was leaking and urine was dark.    Allergies No Known Allergies  Level of Care/Admitting Diagnosis ED Disposition    ED Disposition Condition Comment   Admit  [facility] [[id number]]  Level of Care: Stepdown [14]  Admit to SDU based on following criteria: Other see comments  Comments: Severe Sepsis  Covid Evaluation: N/A  Diagnosis: Severe sepsis [facility](HCC) [[id number]]) [[id number]]  Admitting Physician: Jae DireSEGAL, JARED E [[id number]]  Attending Physician: Jae DireSEGAL, JARED E [[id number]]  Estimated length of stay: 3 - 4 days  Certification:: I certify this patient will need inpatient services for at least 2 midnights  PT Class (Do Not Modify): Inpatient [101]  PT Acc Code (Do Not Modify): Private [1]       B Medical/Surgery History Past Medical History:  Diagnosis Date  . Other and unspecified hyperlipidemia   . Paralysis agitans (HCC)   . Parkinson's disease (HCC) 10/12/2012  . Spinal stenosis    Past Surgical History:  Procedure Laterality Date  . APPENDECTOMY    . CATARACT EXTRACTION  2016   x3  . INCISION AND DRAINAGE ABSCESS N/A 04/02/2018   Procedure: INCISION AND DRAINAGE debridement of sacral decubitis wound;  Surgeon: Andria MeuseWhite, Christopher M, MD;  Location: [facility] OR;  Service: General;  Laterality: N/A;  . INGUINAL HERNIA REPAIR  Bilateral 03/06/2009  . KIDNEY SURGERY       A IV Location/Drains/Wounds Patient Lines/Drains/Airways Status   Active Line/Drains/Airways    Name:   Placement date:   Placement time:   Site:   Days:   Peripheral IV 01/15/19 Right Forearm   01/15/19    1156    Forearm   less than 1   Urethral Catheter [place]and MD Straight-tip 16 Fr.   11/26/18    2113    Straight-tip   50   External Urinary Catheter   04/06/18    0820    -   284   Pressure Injury 04/02/18 Unstageable - Full thickness tissue loss in which the base of the ulcer is covered by slough (yellow, tan, gray, green or brown) and/or eschar (tan, brown or black) in the wound bed. 14x8   04/02/18    0229     288   Pressure Injury 04/02/18 Stage II -  Partial thickness loss of dermis presenting as a shallow open ulcer with a red, pink wound bed without slough. 2x3   04/02/18    0229     288          Intake/Output Last 24 hours No intake or output data in the 24 hours ending 01/15/19 1911  Labs/Imaging Results for orders placed or performed during the hospital encounter of 01/15/19 (from the past 48 hour(s))  Lactic acid, plasma     Status: Abnormal   Collection Time: 01/15/19 12:02 PM  Result Value Ref Range   Lactic Acid,  Venous 4.3 (HH) 0.5 - 1.9 mmol/L    Comment: CRITICAL RESULT CALLED TO, READ BACK BY AND VERIFIED WITH: CALLED TO BINGHAM,RN ON 01/15/19 @ 1250 BY LE  Performed at [facility], [address] [place], [place] [address]   Comprehensive metabolic panel     Status: Abnormal   Collection Time: 01/15/19 12:08 PM  Result Value Ref Range   Sodium 141 135 - 145 mmol/L   Potassium 4.3 3.5 - 5.1 mmol/L   Chloride 112 (H) 98 - 111 mmol/L   CO2 18 (L) 22 - 32 mmol/L   Glucose, Bld 94 70 - 99 mg/dL   BUN 37 (H) 8 - 23 mg/dL   Creatinine, Ser 6.01 0.61 - 1.24 mg/dL   Calcium 8.8 (L) 8.9 - 10.3 mg/dL   Total Protein 7.0 6.5 - 8.1 g/dL   Albumin 3.2 (L) 3.5 - 5.0 g/dL   AST 45 (H) 15 - 41 U/L    ALT 12 0 - 44 U/L   Alkaline Phosphatase 55 38 - 126 U/L   Total Bilirubin 0.9 0.3 - 1.2 mg/dL   GFR calc non Af Amer 56 (L) >60 mL/min   GFR calc Af Amer >60 >60 mL/min   Anion gap 11 5 - 15    Comment: Performed at [facility], [address], [place], [place] [address]  CBC with Differential     Status: Abnormal   Collection Time: 01/15/19 12:08 PM  Result Value Ref Range   WBC 5.8 4.0 - 10.5 K/uL   RBC 3.76 (L) 4.22 - 5.81 MIL/uL   Hemoglobin 11.9 (L) 13.0 - 17.0 g/dL   HCT 55.7 (L) 32.2 - 02.5 %   MCV 100.0 80.0 - 100.0 fL   MCH 31.6 26.0 - 34.0 pg   MCHC 31.6 30.0 - 36.0 g/dL   RDW 42.7 06.2 - 37.6 %   Platelets 269 150 - 400 K/uL   nRBC 0.0 0.0 - 0.2 %   Neutrophils Relative % 94 %   Neutro Abs 5.4 1.7 - 7.7 K/uL   Lymphocytes Relative 6 %   Lymphs Abs 0.4 (L) 0.7 - 4.0 K/uL   Monocytes Relative 0 %   Monocytes Absolute 0.0 (L) 0.1 - 1.0 K/uL   Eosinophils Relative 0 %   Eosinophils Absolute 0.0 0.0 - 0.5 K/uL   Basophils Relative 0 %   Basophils Absolute 0.0 0.0 - 0.1 K/uL   Immature Granulocytes 0 %   Abs Immature Granulocytes 0.01 0.00 - 0.07 K/uL    Comment: Performed at [facility], [address], [place], [place] [address]  Protime-INR     Status: None   Collection Time: 01/15/19 12:08 PM  Result Value Ref Range   Prothrombin Time 15.1 11.4 - 15.2 seconds   INR 1.2 0.8 - 1.2    Comment: (NOTE) INR goal varies based on device and disease states. Performed at [facility], [address], [place], [place] [address]   APTT     Status: None   Collection Time: 01/15/19 12:08 PM  Result Value Ref Range   aPTT 29 24 - 36 seconds    Comment: Performed at [facility], [address], [place], [place] [address]  SARS CORONAVIRUS 2 (TAT 6-24 HRS) Nasopharyngeal Nasopharyngeal Swab     Status: None   Collection Time: 01/15/19 12:31 PM   Specimen: Nasopharyngeal Swab  Result Value Ref  Range   SARS Coronavirus 2  NEGATIVE NEGATIVE    Comment: (NOTE) SARS-CoV-2 target nucleic acids are NOT DETECTED. The SARS-CoV-2 RNA is generally detectable in upper and lower respiratory specimens during the acute phase of infection. Negative results do not preclude SARS-CoV-2 infection, do not rule out co-infections with other pathogens, and should not be used as the sole basis for treatment or other patient management decisions. Negative results must be combined with clinical observations, patient history, and epidemiological information. The expected result is Negative. Fact Sheet for Patients: HairSlick.no Fact Sheet for Healthcare Providers: quierodirigir.com This test is not yet approved or cleared by the [place] FDA and  has been authorized for detection and/or diagnosis of SARS-CoV-2 by FDA under an Emergency Use Authorization (EUA). This EUA will remain  in effect (meaning this test can be used) for the duration of the COVID-19 declaration under Section 56 4(b)(1) of the Act, 21 U.S.C. section 360bbb-3(b)(1), unless the authorization is terminated or revoked sooner. Performed at [facility], [address], [place], [place] [address]   Lactic acid, plasma     Status: Abnormal   Collection Time: 01/15/19  2:08 PM  Result Value Ref Range   Lactic Acid, Venous 2.5 (HH) 0.5 - 1.9 mmol/L    Comment: CRITICAL VALUE NOTED.  VALUE IS CONSISTENT WITH PREVIOUSLY REPORTED AND CALLED VALUE. Performed at [facility], [address] [place], [place] [address]   Lactic acid, plasma     Status: Abnormal   Collection Time: 01/15/19  5:33 PM  Result Value Ref Range   Lactic Acid, Venous 3.1 (HH) 0.5 - 1.9 mmol/L    Comment: CRITICAL VALUE NOTED.  VALUE IS CONSISTENT WITH PREVIOUSLY REPORTED AND CALLED VALUE. Performed at [facility], [address] [place], [place] [address]     Dg Chest Portable 1 View  Result Date: 01/15/2019 CLINICAL DATA:  Fever, altered mental status EXAM: PORTABLE CHEST 1 VIEW COMPARISON:  August 12, 2017 FINDINGS: Rotated. The extreme left costophrenic angle is excluded. No consolidation or edema. No pleural effusion. Likely normal heart size for portable technique. IMPRESSION: No acute process in the chest. Electronically Signed   By: Guadlupe Spanish M.D.   On: 01/15/2019 13:57    Pending Labs Unresulted Labs (From admission, onward)    Start     Ordered   01/15/19 1714  Lactic acid, plasma  STAT Now then every 3 hours,   R (with STAT occurrences)     01/15/19 1713   01/15/19 1302  Urine culture  ONCE - STAT,   STAT     01/15/19 1302   01/15/19 1208  Culture, blood (Routine x 2)  BLOOD CULTURE X 2,   STAT     01/15/19 1208   01/15/19 1208  Urinalysis, Routine w reflex microscopic  ONCE - STAT,   STAT     01/15/19 1208   Signed and Held  [employer] morning,   R     Signed and Held   Signed and Held  Cortisol-am, blood  Tomorrow morning,   R     Signed and Held   Signed and Held  Procalcitonin  Tomorrow morning,   R     Signed and Held   Signed and Held  CBC  (enoxaparin (LOVENOX)    CrCl >/= 30 ml/min)  Once,   R    Comments: Baseline for enoxaparin therapy IF NOT ALREADY DRAWN.  Notify MD if PLT < 100 K.    Signed  and Held   Signed and Held  Creatinine, serum  (enoxaparin (LOVENOX)    CrCl >/= 30 ml/min)  Once,   R    Comments: Baseline for enoxaparin therapy IF NOT ALREADY DRAWN.    Signed and Held   Signed and Held  Creatinine, serum  (enoxaparin (LOVENOX)    CrCl >/= 30 ml/min)  Weekly,   R    Comments: while on enoxaparin therapy    Signed and Held   Signed and Held  Brain natriuretic peptide  Once,   R     Signed and Held   Signed and Held  Urinalysis, Complete w Microscopic  Once,   R     Signed and Held   Signed and Held  Comprehensive metabolic panel  Tomorrow morning,   R     Signed and Held   Signed and  Held  CBC  Tomorrow morning,   R     Signed and Held   Signed and Held  Blood gas, arterial  Once,   R     Signed and Held   Signed and Held  C difficile quick scan w PCR reflex  (C Difficile quick screen w PCR reflex panel)  Once, for 24 hours,   R     Signed and Held          Vitals/Pain Today's Vitals   01/15/19 1700 01/15/19 1730 01/15/19 1800 01/15/19 1830  BP: 103/64 105/66 (!) 102/51 97/60  Pulse: (!) 102 (!) 102 97 96  Resp: 18 19 13 12   Temp: (!) 101 F (38.3 C)     TempSrc: Rectal     SpO2: 100% 100% 100% 100%  Weight:      Height:        Isolation Precautions No active isolations  Medications Medications  ceFEPIme (MAXIPIME) 2 g in sodium chloride 0.9 % 100 mL IVPB (has no administration in time range)  vancomycin (VANCOCIN) IVPB 750 mg/150 ml premix (has no administration in time range)  ceFEPIme (MAXIPIME) 2 g in sodium chloride 0.9 % 100 mL IVPB (0 g Intravenous Stopped 01/15/19 1348)  lactated ringers bolus 1,000 mL (0 mLs Intravenous Stopped 01/15/19 1419)    And  lactated ringers bolus 1,000 mL (0 mLs Intravenous Stopped 01/15/19 1615)  vancomycin (VANCOCIN) IVPB 1000 mg/200 mL premix (0 mg Intravenous Stopped 01/15/19 1505)    Mobility non-ambulatory High fall risk   Focused Assessments    R Recommendations: See Admitting Provider Note  Report given to:   Additional Notes:

## 2019-01-15 NOTE — Progress Notes (Signed)
A consult was received from an ED physician for vancomycin and cefepime per pharmacy dosing.  The patient's profile has been reviewed for ht/wt/allergies/indication/available labs.    A one time order has been placed for vancomycin and cefepime.  Further antibiotics/pharmacy consults should be ordered by admitting physician if indicated.                       Thank you, Lynelle Doctor 01/15/2019  1:08 PM
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
## 2019-01-15 NOTE — ED Notes (Signed)
MD notified of probable code sepsis.

## 2019-01-15 NOTE — Progress Notes (Signed)
Pharmacy Antibiotic Note  Mark Mathews is a 78 y.o. 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 Pharmacy has been consulted for vancomycin and cefepime for broad coverage empiric coverage.  - Tmax 103, wbc 5.8 -  scr 1.22 (crcl~43)   Plan: - cefepime 2gm IV q12h - vancomycin 1000mg  IV x1 given in the ED, then 750 mg IV q24h for est AUC 425 ________________________________  Height: 5\' 8"  (172.7 cm) Weight: 135 lb (61.2 kg) IBW/kg (Calculated) : 68.4  Temp (24hrs), Avg:103 F (39.4 C), Min:103 F (39.4 C), Max:103 F (39.4 C)  Recent Labs  Lab 01/15/19 1202 01/15/19 1208 01/15/19 1408  WBC  --  5.8  --   CREATININE  --  1.22  --   LATICACIDVEN 4.3*  --  2.5*    Estimated Creatinine Clearance: 43.2 mL/min (by C-G formula based on SCr of 1.22 mg/dL).    No Known Allergies  Antimicrobials this admission:  11/13 vanc>> 11/13 cefepime>>  Microbiology results:  11/13 BCx x2:   Thank you for allowing pharmacy to be a part of this patient's care.  Lynelle Doctor 01/15/2019 5:35 PM

## 2019-01-15 NOTE — ED Notes (Signed)
Writer called patient's son Lawerence Dery. [name] states patient has lived with him since February 2020. [name] states patient is normally A&O x4, and was at baseline until this morning. Denies any temperature until today. Per son, patient has been eating up until today when patient became confused and stated his left hip hurt. Son states patient has home health who "comes every few weeks" and a wound nurse who checks pressure wounds "ever so often"

## 2019-01-15 NOTE — ED Notes (Signed)
BLADDER SCAN RESULTED 51mL's

## 2019-01-16 ENCOUNTER — Other Ambulatory Visit: Payer: Self-pay

## 2019-01-16 DIAGNOSIS — R7881 Bacteremia: Secondary | ICD-10-CM

## 2019-01-16 LAB — COMPREHENSIVE METABOLIC PANEL
ALT: 61 U/L — ABNORMAL HIGH (ref 0–44)
AST: 88 U/L — ABNORMAL HIGH (ref 15–41)
Albumin: 2.7 g/dL — ABNORMAL LOW (ref 3.5–5.0)
Alkaline Phosphatase: 56 U/L (ref 38–126)
Anion gap: 9 (ref 5–15)
BUN: 34 mg/dL — ABNORMAL HIGH (ref 8–23)
CO2: 19 mmol/L — ABNORMAL LOW (ref 22–32)
Calcium: 8 mg/dL — ABNORMAL LOW (ref 8.9–10.3)
Chloride: 112 mmol/L — ABNORMAL HIGH (ref 98–111)
Creatinine, Ser: 1.11 mg/dL (ref 0.61–1.24)
GFR calc Af Amer: >60 mL/min (ref >60)
GFR calc non Af Amer: >60 mL/min (ref >60)
Glucose, Bld: 90 mg/dL (ref 70–99)
Potassium: 3.6 mmol/L (ref 3.5–5.1)
Sodium: 140 mmol/L (ref 135–145)
Total Bilirubin: 0.7 mg/dL (ref 0.3–1.2)
Total Protein: 6.2 g/dL — ABNORMAL LOW (ref 6.5–8.1)

## 2019-01-16 LAB — BLOOD CULTURE ID PANEL (REFLEXED)

## 2019-01-16 LAB — CBC
HCT: 33.5 % — ABNORMAL LOW (ref 39.0–52.0)
Hemoglobin: 10.6 g/dL — ABNORMAL LOW (ref 13.0–17.0)
MCH: 31.6 pg (ref 26.0–34.0)
MCHC: 31.6 g/dL (ref 30.0–36.0)
MCV: 100 fL (ref 80.0–100.0)
Platelets: 231 10*3/uL (ref 150–400)
RBC: 3.35 MIL/uL — ABNORMAL LOW (ref 4.22–5.81)
RDW: 12.8 % (ref 11.5–15.5)
WBC: 28.8 10*3/uL — ABNORMAL HIGH (ref 4.0–10.5)
nRBC: 0 % (ref 0.0–0.2)

## 2019-01-16 LAB — PROTIME-INR
INR: 1.4 — ABNORMAL HIGH (ref 0.8–1.2)
Prothrombin Time: 17.2 seconds — ABNORMAL HIGH (ref 11.4–15.2)

## 2019-01-16 LAB — CORTISOL-AM, BLOOD: Cortisol - AM: 38.1 ug/dL — ABNORMAL HIGH (ref 6.7–22.6)

## 2019-01-16 LAB — PROCALCITONIN: Procalcitonin: 63.62 ng/mL

## 2019-01-16 MED ORDER — POLYETHYLENE GLYCOL 3350 17 G PO PACK
17.0000 g | PACK | Freq: Every day | ORAL | Status: DC
  Administered 2019-01-16 – 2019-01-19 (×4): 17 g via ORAL
  Filled 2019-01-16 (×4): qty 1

## 2019-01-16 MED ORDER — CHLORHEXIDINE GLUCONATE CLOTH 2 % EX PADS
6.0000 | MEDICATED_PAD | Freq: Every day | CUTANEOUS | Status: DC
  Administered 2019-01-16 – 2019-01-17 (×2): 6 via TOPICAL

## 2019-01-16 MED ORDER — SODIUM CHLORIDE 0.9 % IV SOLN
2.0000 g | Freq: Every day | INTRAVENOUS | Status: DC
  Administered 2019-01-16 – 2019-01-18 (×3): 2 g via INTRAVENOUS
  Filled 2019-01-16 (×3): qty 2

## 2019-01-16 MED ORDER — SODIUM CHLORIDE 0.9 % IV BOLUS
1000.0000 mL | Freq: Once | INTRAVENOUS | Status: AC
  Administered 2019-01-16: 14:00:00 500 mL via INTRAVENOUS

## 2019-01-16 MED ORDER — ORAL CARE MOUTH RINSE
15.0000 mL | Freq: Two times a day (BID) | OROMUCOSAL | Status: DC
  Administered 2019-01-16 – 2019-01-21 (×8): 15 mL via OROMUCOSAL

## 2019-01-16 MED ORDER — LIP MEDEX EX OINT: 1.0000 "application " | TOPICAL_OINTMENT | CUTANEOUS | Status: DC | PRN

## 2019-01-16 NOTE — Progress Notes (Addendum)
PROGRESS NOTE    Mark Mathews    Code Status: DNR  UJW:[id number] DOB: [birth year] DOA: 01/15/2019  PCP: Maurice Small, MD    Hospital Summary  This is a 78 year old male with past medical history of Parkinson's, bedbound, chronic indwelling catheter with traumatic hypospadias and recurrent UTI with recent antibiotic use with noted loose stools, sacral decubitus ulcer who presented to the ED 11/13 with altered mental status x1 day.  Patient was admitted to stepdown unit for severe sepsis with tachycardia, tachypnea, lactic acidosis 4.3 ->>>1.8, altered mental status, WBC 30.  Initially with concern for urinary source versus GI source.  Started on broad-spectrum antibiotics with cefepime/vancomycin/Flagyl as well as IV fluid resuscitation. CT abdomen pelvis in ED showed new onset bilateral hydroureteronephrosis down to bladder as well as stercoral colitis and large volume fecal impaction.  Cultures positive for Proteus and antibiotics changed to ceftriaxone monotherapy.  Urology was consulted who recommended bilateral PCN if patient is viable long-term and did not recommend Foley catheter change.  Manual disimpaction was attempted by nursing but unsuccessful on 11/14.  He was started on MiraLAX.  Later in the day 11/14 patient noted to have persistently low BP in 80s/30s and given IV fluid bolus.   A & P   Principal Problem:   Severe sepsis (HCC) Active Problems:   Pressure injury of skin   Dementia with behavioral disturbance (HCC)   Bacteremia   Acute metabolic encephalopathy secondary to Proteus bacteremia and severe sepsis, likely urinary source febrile, tachycardic, tachypneic, altered mental status, lactic acid 4.3 admission-> 2.5-> 3.1->1.8, early on room air.  LR in ED and NS maintenance. Covid negative. Received vancomycin/cefepime in ED.  Antibiotics broadened to cefepime/vancomycin/Flagyl and changed to ceftriaxone monotherapy with resulted blood cultures. Hypotensive to 80s/30s  today -IVF bolus -Continue Ceftriaxone -Repeat Blood cultures -Consider palliative care consult for goals of care discussion.  Patient was previously on hospice.  DNR per son  Bilateral hydronephrosis -Urology consulted: recommended bilateral PCN if patient is viable long-term which would allow for catheter removal. did not recommend Foley catheter change.  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  No indication for surgery -Miralax and soap suds enemas -Discontinue C diff screen as loose stools likely from large volume constipation. Has not had diarrhea inpatient. -If no improvement, consult GI for disimpaction  Recurrent UTI in setting of chronic indwelling catheter completed course of Keflex at the end of September and course of Macrobid at the end of October.  Proteus mirabilis UTI 11/26/2018 -Plan as above  AKI secondary to severe sepsis received 2 L LR bolus and maintenance fluid.  M proving, creatinine 1.22-> 1.11, baseline 0.9 -IV fluids  Parkinson's continue Sinemet IR to prevent withdrawal.  Hold remaining home meds - SLP swallow eval  Nongap metabolic acidosis secondary to sepsis, AKI and hyperchloremia.  Improving -Monitor  DVT prophylaxis: Lovenox Diet: N.p.o. pending SLP eval Family Communication: Patient's son has been updated by phone Disposition Plan: Continue medical management as above in step down unit  Consultants  Urology  Procedures  Manual disimpaction-unsuccessful  Antibiotics  Cefepime/vancomycin/Flagyl 11/13-> 11/14 Ceftriaxone 11/14->      Subjective   Patient is a poor historian and unable to provide much history today however was able to answer yes/no questions and denied complaints.  Objective   Vitals:   01/16/19 1100 01/16/19 1200 01/16/19 1300 01/16/19 1400  BP: (!) 128/93  109/66  (!)  88/30  Pulse: 71 77 77 70  Resp: 14 16 19 17   Temp:  98.9 F (37.2 C)    TempSrc:  Axillary    SpO2: 100% 100% 100% 100%  Weight:      Height:        Intake/Output Summary (Last 24 hours) at 01/16/2019 1538 Last data filed at 01/16/2019 1200 Gross per 24 hour  Intake 1015.53 ml  Output --  Net 1015.53 ml   Filed Weights   01/15/19 1205  Weight: 61.2 kg    Examination:  Physical Exam Vitals signs and nursing note reviewed.  Constitutional:      Appearance: He is not diaphoretic.     Comments: Somnolent, arousable to verbal stimuli.  Answers yes/no questions  Less toxic appearing today compared to yesterday  HENT:     Head: Normocephalic and atraumatic.  Cardiovascular:     Rate and Rhythm: Normal rate and regular rhythm.  Pulmonary:     Effort: Pulmonary effort is normal.     Breath sounds: Normal breath sounds.  Abdominal:     General: Abdomen is flat.     Palpations: Abdomen is soft.  Genitourinary:    Comments: Indwelling catheter Musculoskeletal:     Right lower leg: No edema.     Left lower leg: No edema.  Skin:    General: Skin is warm.     Findings: No rash.  Neurological:     Mental Status: He is disoriented.     Data Reviewed: I have personally reviewed following labs and imaging studies  CBC: Recent Labs  Lab 01/15/19 1208 01/15/19 2052 01/16/19 0216  WBC 5.8 30.5* 28.8*  NEUTROABS 5.4  --   --   HGB 11.9* 10.6* 10.6*  HCT 37.6* 33.5* 33.5*  MCV 100.0 102.8* 100.0  PLT 269 205 782   Basic Metabolic Panel: Recent Labs  Lab 01/15/19 1208 01/15/19 2052 01/16/19 0216  NA 141  --  140  K 4.3  --  3.6  CL 112*  --  112*  CO2 18*  --  19*  GLUCOSE 94  --  90  BUN 37*  --  34*  CREATININE 1.22 1.22 1.11  CALCIUM 8.8*  --  8.0*   GFR: Estimated Creatinine Clearance: 47.5 mL/min (by C-G formula based on SCr of 1.11 mg/dL). Liver Function Tests: Recent Labs  Lab 01/15/19 1208 01/16/19 0216  AST 45* 88*  ALT 12 61*  ALKPHOS 55  56  BILITOT 0.9 0.7  PROT 7.0 6.2*  ALBUMIN 3.2* 2.7*   No results for input(s): LIPASE, AMYLASE in the last 168 hours. No results for input(s): AMMONIA in the last 168 hours. Coagulation Profile: Recent Labs  Lab 01/15/19 1208 01/16/19 0216  INR 1.2 1.4*   Cardiac Enzymes: No results for input(s): CKTOTAL, CKMB, CKMBINDEX, TROPONINI in the last 168 hours. BNP (last 3 results) No results for input(s): PROBNP in the last 8760 hours. HbA1C: No results for input(s): HGBA1C in the last 72 hours. CBG: No results for input(s): GLUCAP in the last 168 hours. Lipid Profile: No results for input(s): CHOL, HDL, LDLCALC, TRIG, CHOLHDL, LDLDIRECT in the last 72 hours. Thyroid Function Tests: No results for input(s): TSH, T4TOTAL, FREET4, T3FREE, THYROIDAB in the last 72 hours. Anemia Panel: No results for input(s): VITAMINB12, FOLATE, FERRITIN, TIBC, IRON, RETICCTPCT in the last 72 hours. Sepsis Labs: Recent Labs  Lab 01/15/19 1202 01/15/19 1408 01/15/19 1733 01/15/19 2052 01/16/19 0216  PROCALCITON  --   --   --   --  63.62  LATICACIDVEN 4.3* 2.5* 3.1* 1.8  --     Recent Results (from the past 240 hour(s))  Culture, blood (Routine x 2)     Status: None (Preliminary result)   Collection Time: 01/15/19 12:00 PM   Specimen: BLOOD  Result Value Ref Range Status   Specimen Description   Final    BLOOD LEFT ANTECUBITAL Performed at [facility], [address] [place], [place] [address]    Special Requests   Final    BOTTLES DRAWN AEROBIC AND ANAEROBIC Blood Culture adequate volume Performed at [facility], [address] [place], [place] [address]    Culture  Setup Time   Final    IN BOTH AEROBIC AND ANAEROBIC BOTTLES GRAM VARIABLE ROD CRITICAL RESULT CALLED TO, READ BACK BY AND VERIFIED WITH: B GREEN PHARMD 01/16/19 0308 JDW Performed at [facility], [address] [place], [place] [address]    Culture GRAM NEGATIVE RODS   Final   Report Status PENDING  Incomplete  Blood Culture ID Panel (Reflexed)     Status: Abnormal   Collection Time: 01/15/19 12:00 PM  Result Value Ref Range Status   Enterococcus species NOT DETECTED NOT DETECTED Final   Listeria monocytogenes NOT DETECTED NOT DETECTED Final   Staphylococcus species NOT DETECTED NOT DETECTED Final   Staphylococcus aureus (BCID) NOT DETECTED NOT DETECTED Final   Streptococcus species NOT DETECTED NOT DETECTED Final   Streptococcus agalactiae NOT DETECTED NOT DETECTED Final   Streptococcus pneumoniae NOT DETECTED NOT DETECTED Final   Streptococcus pyogenes NOT DETECTED NOT DETECTED Final   Acinetobacter baumannii NOT DETECTED NOT DETECTED Final   Enterobacteriaceae species DETECTED (A) NOT DETECTED Final    Comment: Enterobacteriaceae represent a large family of gram-negative bacteria, not a single organism. CRITICAL RESULT CALLED TO, READ BACK BY AND VERIFIED WITH: B GREEN PHARMD 01/16/19 0308 JDW    Enterobacter cloacae complex NOT DETECTED NOT DETECTED Final   Escherichia coli NOT DETECTED NOT DETECTED Final   Klebsiella oxytoca NOT DETECTED NOT DETECTED Final   Klebsiella pneumoniae NOT DETECTED NOT DETECTED Final   Proteus species DETECTED (A) NOT DETECTED Final    Comment: CRITICAL RESULT CALLED TO, READ BACK BY AND VERIFIED WITH: B GREEN PHARMD 01/16/19 0308 JDW    Serratia marcescens NOT DETECTED NOT DETECTED Final   Carbapenem resistance NOT DETECTED NOT DETECTED Final   Haemophilus influenzae NOT DETECTED NOT DETECTED Final   Neisseria meningitidis NOT DETECTED NOT DETECTED Final   Pseudomonas aeruginosa NOT DETECTED NOT DETECTED Final   Candida albicans NOT DETECTED NOT DETECTED Final   Candida glabrata NOT DETECTED NOT DETECTED Final   Candida krusei NOT DETECTED NOT DETECTED Final   Candida parapsilosis NOT DETECTED NOT DETECTED Final   Candida tropicalis NOT DETECTED NOT DETECTED Final    Comment: Performed at [facility], [address] [place], [place] [address]  Culture, blood (Routine x 2)     Status: None (Preliminary result)   Collection Time: 01/15/19 12:02 PM   Specimen: BLOOD RIGHT FOREARM  Result Value Ref Range Status   Specimen Description   Final    BLOOD RIGHT FOREARM Performed at [facility], [address] [place], [place] [address]    Special Requests   Final    BOTTLES DRAWN AEROBIC AND ANAEROBIC Blood Culture results may not be optimal due to an excessive volume of blood received in culture bottles Performed at [facility], [address], [place], [place] [address]    Culture  Setup Time   Final    IN BOTH AEROBIC AND ANAEROBIC BOTTLES GRAM VARIABLE ROD CRITICAL VALUE NOTED.  VALUE IS CONSISTENT WITH PREVIOUSLY REPORTED AND CALLED VALUE. Performed at [facility], [address], [place], [place] [address]    Culture GRAM NEGATIVE RODS  Final   Report Status PENDING  Incomplete  SARS CORONAVIRUS 2 (TAT 6-24 HRS) Nasopharyngeal Nasopharyngeal Swab     Status: None   Collection Time: 01/15/19 12:31 PM   Specimen: Nasopharyngeal Swab  Result Value Ref Range Status   SARS Coronavirus 2 NEGATIVE NEGATIVE Final    Comment: (NOTE) SARS-CoV-2 target nucleic acids are NOT DETECTED. The SARS-CoV-2 RNA is generally detectable in upper and lower respiratory specimens during the acute phase of infection. Negative results do not preclude SARS-CoV-2 infection, do not rule out co-infections with other pathogens, and should not be used as the sole basis for treatment or other patient management decisions. Negative results must be combined with clinical observations, patient history, and epidemiological information. The expected result is Negative. Fact Sheet for Patients: HairSlick.no Fact Sheet for Healthcare Providers: quierodirigir.com This test is not yet approved or cleared by the [place] FDA and  has been authorized for detection and/or diagnosis of SARS-CoV-2 by FDA under an Emergency Use Authorization (EUA). This EUA will remain  in effect (meaning this test can be used) for the duration of the COVID-19 declaration under Section 56 4(b)(1) of the Act, 21 U.S.C. section 360bbb-3(b)(1), unless the authorization is terminated or revoked sooner. Performed at [facility], [address], [place], [place] [address]          Radiology Studies: Ct Abdomen Pelvis W Contrast  Result Date: 01/15/2019 CLINICAL DATA:  Sepsis. Indwelling catheter and prior urinary tract infection. Diarrhea. EXAM: CT ABDOMEN AND PELVIS WITH CONTRAST TECHNIQUE: Multidetector CT imaging of the abdomen and pelvis was performed using the standard protocol following bolus administration of intravenous contrast. CONTRAST:  OMNIPAQUE IOHEXOL 300 MG/ML  SOLN COMPARISON:  CT 04/02/2018 FINDINGS: Lower chest: Mild bilateral pleural thickening with trace left pleural effusion. Mild basilar atelectasis. Coronary artery calcifications. Partially motion obscured. Hepatobiliary: No focal liver abnormality is seen. No gallstones, gallbladder wall thickening, or biliary dilatation. Pancreas: Motion obscured.  No ductal dilatation or inflammation. Spleen: Normal in size without focal abnormality. Adrenals/Urinary Tract: No adrenal nodule. Bilateral hydroureteronephrosis, new from prior exam. There is right periureteric stranding and enhancement of the right renal collecting system. Both ureters are dilated to the bladder insertion. Bilateral perinephric edema. Diminished excretion on delayed phase imaging from both kidneys. Foley catheter within the urinary bladder. Diffuse urinary bladder wall thickening, enhancement, and perivesicular edema. Few scattered small bladder diverticula. Stomach/Bowel: Large volume of stool distends the rectum, rectal distention of 8.6 cm. Mild anorectal wall thickening and  perirectal edema. No definite pneumatosis. Large volume of stool in the sigmoid colon. Distal descending colon is tortuous and decompressed. Small to moderate volume of stool throughout the remainder the colon. Appendix not visualized, surgically absent per history. Few fluid-filled small bowel loops without obstruction. Inflammatory changes in the right pericolic gutter, favored to be reactive secondary to adjacent ureteral inflammation. Fluid-filled duodenum. Moderate hiatal hernia with small volume of fluid in the stomach. No obvious source of perforation. Vascular/Lymphatic: Moderate aorto bi-iliac atherosclerosis. Diffuse vascular tortuosity. Retroaortic left renal vein. Patent portal vein. Limited assessment for adenopathy given patient motion. No bulky  abdominopelvic lymph nodes. Reproductive: Prostate is unremarkable. Other: Tiny focus of air adjacent to the gallbladder and left lobe of the liver, this abuts the hepatic flexure of the colon is felt to represent a colonic fold, series 2, image 28. Generalized retroperitoneal stranding suggesting urinary tract infection. Prior lower abdominal wall hernia repair with tacks. No evidence of intra-abdominal abscess. Musculoskeletal: Unchanged moderately severe L3 compression fracture. Multilevel degenerative change in the spine. Bones diffusely under mineralized. Improve subcutaneous edema of the posterior soft tissues. Previous soft tissue gas in the retrosacral soft tissues has resolved, there is residual soft tissue thickening. IMPRESSION: 1. Findings consistent with urinary tract infection with cystitis and ascending urinary tract infection on the right and likely left. Inflammatory changes about the right renal collecting system. No renal abscess. 2. Large volume of stool in the distal sigmoid colon and rectum with mild anorectal wall thickening and perirectal/pericolonic edema, suspicious for fecal impaction or stercoral colitis. 3. Tiny focus of air  adjacent to the gallbladder is felt to be contiguous with the hepatic flexure of the colon rather than a focus of free air, as there is no evidence of bowel perforation. 4. Mild bilateral pleural thickening with trace left pleural effusion. Mild basilar atelectasis. Aortic Atherosclerosis (ICD10-I70.0). Electronically Signed   By: Narda Rutherford M.D.   On: 01/15/2019 20:09   Dg Chest Portable 1 View  Result Date: 01/15/2019 CLINICAL DATA:  Fever, altered mental status EXAM: PORTABLE CHEST 1 VIEW COMPARISON:  August 12, 2017 FINDINGS: Rotated. The extreme left costophrenic angle is excluded. No consolidation or edema. No pleural effusion. Likely normal heart size for portable technique. IMPRESSION: No acute process in the chest. Electronically Signed   By: Guadlupe Spanish M.D.   On: 01/15/2019 13:57        Scheduled Meds:  carbidopa-levodopa  1.5 tablet Oral 5 X Daily   Chlorhexidine Gluconate Cloth  6 each Topical Daily   enoxaparin (LOVENOX) injection  40 mg Subcutaneous Q24H   polyethylene glycol  17 g Oral Daily   Continuous Infusions:  cefTRIAXone (ROCEPHIN)  IV Stopped (01/16/19 0524)   metronidazole Stopped (01/16/19 1243)     LOS: 1 day    Time spent: 30 minutes with over 50% of the time coordinating the patient's care    Jae Dire, DO [facility] Pager [phone]  If 7PM-7AM, please contact night-coverage [email or web address] Password [facility] 01/16/2019, 3:38 PM

## 2019-01-16 NOTE — Progress Notes (Signed)
PHARMACY - PHYSICIAN COMMUNICATION CRITICAL VALUE ALERT - BLOOD CULTURE IDENTIFICATION (BCID)  Mark Mathews is an 78 y.o. male who presented to [facility] on 01/15/2019 with a chief complaint of altered mental status  Assessment:  Urine  (include suspected source if known) 2 of 4 bottles proteus  Name of physician (or Provider) Contacted: Jeannette Corpus, NP  Current antibiotics: Cefepime and Vancomycin  Changes to prescribed antibiotics recommended:  Recommendations accepted by provider  Will narrow to Rocephin per algorithm.   Results for orders placed or performed during the hospital encounter of 01/15/19  Blood Culture ID Panel (Reflexed) (Collected: 01/15/2019 12:00 PM)  Result Value Ref Range   Enterococcus species NOT DETECTED NOT DETECTED   Listeria monocytogenes NOT DETECTED NOT DETECTED   Staphylococcus species NOT DETECTED NOT DETECTED   Staphylococcus aureus (BCID) NOT DETECTED NOT DETECTED   Streptococcus species NOT DETECTED NOT DETECTED   Streptococcus agalactiae NOT DETECTED NOT DETECTED   Streptococcus pneumoniae NOT DETECTED NOT DETECTED   Streptococcus pyogenes NOT DETECTED NOT DETECTED   Acinetobacter baumannii NOT DETECTED NOT DETECTED   Enterobacteriaceae species DETECTED (A) NOT DETECTED   Enterobacter cloacae complex NOT DETECTED NOT DETECTED   Escherichia coli NOT DETECTED NOT DETECTED   Klebsiella oxytoca NOT DETECTED NOT DETECTED   Klebsiella pneumoniae NOT DETECTED NOT DETECTED   Proteus species DETECTED (A) NOT DETECTED   Serratia marcescens NOT DETECTED NOT DETECTED   Carbapenem resistance NOT DETECTED NOT DETECTED   Haemophilus influenzae NOT DETECTED NOT DETECTED   Neisseria meningitidis NOT DETECTED NOT DETECTED   Pseudomonas aeruginosa NOT DETECTED NOT DETECTED   Candida albicans NOT DETECTED NOT DETECTED   Candida glabrata NOT DETECTED NOT DETECTED   Candida krusei NOT DETECTED NOT DETECTED   Candida parapsilosis NOT DETECTED NOT DETECTED   Candida tropicalis NOT DETECTED NOT DETECTED    Dorrene German 01/16/2019  3:16 AM

## 2019-01-16 NOTE — Evaluation (Signed)
Clinical/Bedside Swallow Evaluation Patient Details  Name: Mark Mathews MRN: [id number] Date of Birth: [birth year]  Today's Date: 01/16/2019 Time: SLP Start Time (ACUTE ONLY): 54 SLP Stop Time (ACUTE ONLY): 1552 SLP Time Calculation (min) (ACUTE ONLY): 22 min  Past Medical History:  Past Medical History:  Diagnosis Date  . Other and unspecified hyperlipidemia   . Paralysis agitans ([place])   . Parkinson's disease ([place]) 10/12/2012  . Spinal stenosis    Past Surgical History:  Past Surgical History:  Procedure Laterality Date  . APPENDECTOMY    . CATARACT EXTRACTION  2016   x3  . INCISION AND DRAINAGE ABSCESS N/A 04/02/2018   Procedure: INCISION AND DRAINAGE debridement of sacral decubitis wound;  Surgeon: Ileana Roup, MD;  Location: [place];  Service: General;  Laterality: N/A;  . INGUINAL HERNIA REPAIR Bilateral 03/06/2009  . KIDNEY SURGERY     HPI:  78 year old male admitted on 01/15/19 with past medical history of Parkinson's, bedbound, chronic indwelling catheter UTI, sacral decubitus ulcer who presented to the ED today with altered mental status x1 day; CXR on 01/15/19 negative for acute process; BSE generated.  Assessment / Plan / Recommendation Clinical Impression   Pt exhibits oropharyngeal dysphagia characterized by decreased oral propulsion/transit, oral holding and generalized oral weakness with lethargy/cognitive impairment placing pt at mild-moderate risk for aspiration without implementation of swallowing precautions/aspiration precautions during PO intake; recommend initiating a conservative diet of Dysphagia 1 (puree)/thin liquids with general swallowing precautions/full supervision as pt is dependent for feeding.  ST will continue to f/u while in acute setting for diet tolerance and progression as pt able; thank you for this consult.  SLP Visit Diagnosis: Dysphagia, unspecified (R13.10)    Aspiration Risk  Mild aspiration risk;Moderate aspiration risk    Diet  Recommendation   Dysphagia 1 (puree)/thin liquids  Medication Administration: Crushed with puree    Other  Recommendations Oral Care Recommendations: Oral care BID   Follow up Recommendations Other (comment)(TBD)      Frequency and Duration min 2x/week  1 week       Prognosis Prognosis for Safe Diet Advancement: Fair Barriers to Reach Goals: Cognitive deficits      Swallow Study   General Date of Onset: 01/15/19 HPI: 78 year old male with past medical history of Parkinson's, bedbound, chronic indwelling catheter UTI, sacral decubitus ulcer who presented to the ED today with altered mental status x1 day Type of Study: Bedside Swallow Evaluation Previous Swallow Assessment: (None noted) Diet Prior to this Study: NPO Temperature Spikes Noted: Yes Respiratory Status: Room air History of Recent Intubation: No Behavior/Cognition: Confused;Lethargic/Drowsy;Requires cueing Oral Cavity Assessment: Within Functional Limits Oral Care Completed by SLP: Recent completion by staff Self-Feeding Abilities: Total assist Patient Positioning: Upright in bed Baseline Vocal Quality: Low vocal intensity Volitional Cough: Weak Volitional Swallow: Able to elicit    Oral/Motor/Sensory Function Overall Oral Motor/Sensory Function: Generalized oral weakness Facial Symmetry: Within Functional Limits   Ice Chips Ice chips: Impaired Presentation: Spoon Oral Phase Impairments: Reduced lingual movement/coordination Oral Phase Functional Implications: Oral holding Pharyngeal Phase Impairments: Suspected delayed Swallow   Thin Liquid Thin Liquid: Impaired Presentation: Straw;Spoon Oral Phase Impairments: Reduced lingual movement/coordination Oral Phase Functional Implications: Oral holding Pharyngeal  Phase Impairments: Suspected delayed Swallow    Nectar Thick Nectar Thick Liquid: Not tested   Honey Thick Honey Thick Liquid: Not tested   Puree Puree: Impaired Presentation: Spoon Oral Phase  Impairments: Reduced lingual movement/coordination Oral Phase Functional Implications: Prolonged oral transit;Oral holding Pharyngeal Phase Impairments:  Suspected delayed Swallow   Solid     Solid: Impaired Presentation: Spoon Oral Phase Impairments: Impaired mastication;Reduced lingual movement/coordination Oral Phase Functional Implications: Impaired mastication;Prolonged oral transit Pharyngeal Phase Impairments: Suspected delayed Swallow      Mark Mathews, M.S., CCC-SLP 01/16/2019,4:17 PM

## 2019-01-16 NOTE — Consult Note (Signed)
Urology Consult  Consulting MD: Whitney Post, MD  CC: Hydronephrosis  HPI: This is a 78year old male with multiple medical issues, admitted with altered mental status, urinary tract infection.  He has an indwelling Foley catheter that, despite adequate positioning, still allows for leakage around the catheter because of probable bladder spasms.  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  Because of Proteus UTI, CT abdomen and pelvis was performed.  This showed new onset bilateral hydroureteronephrosis down to the bladder.  Prior CT scan from January of this year revealed normally draining kidneys.  Urologic consultation is requested.  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  Despite the catheter, there is leakage around this allowing urine to soil his ulcer.  The patient also has fecal incontinence.  PMH: Past Medical History:  Diagnosis Date  . Other and unspecified hyperlipidemia   . Paralysis agitans (HCC)   . Parkinson's disease (HCC) 10/12/2012  . Spinal stenosis     PSH: Past Surgical History:  Procedure Laterality Date  . APPENDECTOMY    . CATARACT EXTRACTION  2016   x3  . INCISION AND DRAINAGE ABSCESS N/A 04/02/2018   Procedure: INCISION AND DRAINAGE debridement of sacral decubitis wound;  Surgeon: Andria Meuse, MD;  Location: [facility] OR;  Service: General;  Laterality: N/A;  . INGUINAL HERNIA REPAIR Bilateral 03/06/2009  . KIDNEY SURGERY      Allergies: No Known Allergies  Medications: Medications Prior to Admission  Medication Sig Dispense Refill Last Dose  . acetaminophen (TYLENOL) 500 MG tablet Take 2 tablets (1,000 mg total) by mouth every 8 (eight) hours as needed. (Patient taking differently: Take 1,000 mg by mouth every 8 (eight) hours as needed for moderate pain. ) 30 tablet 0 01/15/2019 at Unknown time  . carbidopa-levodopa (SINEMET IR) 25-100 MG tablet TAKE ONE AND  ONE-HALF TABLETS FIVE TIMES DAILY (Patient taking differently: Take 1.5 tablets by mouth 5 (five) times daily. ) 675 tablet 4 01/15/2019 at Unknown time  . feeding supplement (BOOST HIGH PROTEIN) LIQD Take 1 Container by mouth daily as needed (at patient request). Chocolate   unknown  . oxyCODONE (OXY IR/ROXICODONE) 5 MG immediate release tablet Take 1 tablet (5 mg total) by mouth every 6 (six) hours as needed for moderate pain. 12 tablet 0 Past Month at Unknown time  . polyvinyl alcohol (LIQUIFILM TEARS) 1.4 % ophthalmic solution Place 1 drop into both eyes as needed for dry eyes.   unknown  . trihexyphenidyl (ARTANE) 2 MG tablet Take 0.5 tablets (1 mg total) by mouth 3 (three) times daily with meals. 135 tablet 3 01/15/2019 at Unknown time  . Amino Acids-Protein Hydrolys (FEEDING SUPPLEMENT, PRO-STAT SUGAR FREE 64,) LIQD Take 30 mLs by mouth 2 (two) times daily. (Patient not taking: Reported on 11/26/2018) 887 mL 0 Not Taking at Unknown time  . cephALEXin (KEFLEX) 500 MG capsule Take 1 capsule (500 mg total) by mouth 4 (four) times daily. (Patient not taking: Reported on 01/15/2019) 28 capsule 0 Not Taking at Unknown time  . entacapone (COMTAN) 200 MG tablet Take 1 tablet (200 mg total) by mouth 5 (five) times daily. In lieu of Stalevo 150 mg (Patient not taking: Reported on 11/26/2018) 450 tablet 3 Not Taking at Unknown time  . feeding supplement, ENSURE ENLIVE, (ENSURE ENLIVE) LIQD Take 237 mLs by mouth 2 (two) times daily between meals. (Patient not taking: Reported on 01/15/2019) 237 mL 12  Not Taking at Unknown time     Social History: Social History   Socioeconomic History  . Marital status: Married    Spouse name: [name]  . Number of children: 2  . Years of education: college  . Highest education level: Not on file  Occupational History    Comment: retired  Scientific laboratory technician  . Financial resource strain: Not on file  . Food insecurity    Worry: Not on file    Inability: Not on file  .  Transportation needs    Medical: Not on file    Non-medical: Not on file  Tobacco Use  . Smoking status: Never Smoker  . Smokeless tobacco: Never Used  Substance and Sexual Activity  . Alcohol use: No    Alcohol/week: 0.0 standard drinks  . Drug use: No  . Sexual activity: Not on file  Lifestyle  . Physical activity    Days per week: Not on file    Minutes per session: Not on file  . Stress: Not on file  Relationships  . Social Herbalist on phone: Not on file    Gets together: Not on file    Attends religious service: Not on file    Active member of club or organization: Not on file    Attends meetings of clubs or organizations: Not on file    Relationship status: Not on file  . Intimate partner violence    Fear of current or ex partner: Not on file    Emotionally abused: Not on file    Physically abused: Not on file    Forced sexual activity: Not on file  Other Topics Concern  . Not on file  Social History Narrative   Consumes caffeine rarely,1 cup daily    Family History: Family History  Problem Relation Age of Onset  . Heart failure Father     Review of Systems: Level V caveat  Physical Exam: @VITALS2 @ General: Not arousable. Head:  Normocephalic.  Atraumatic. ENT:  EOMI.  Mucous membranes dry Neck:  Supple.  No lymphadenopathy. CV:  Regular rate. Skin:  Normal turgor.  No visible rash. Extremity: No gross deformity of upper extremities.  No gross deformity of lower extremities. Neurologic: Alert. Appropriate mood.    Studies:  Recent Labs    01/15/19 2052 01/16/19 0216  HGB 10.6* 10.6*  WBC 30.5* 28.8*  PLT 205 231    Recent Labs    01/15/19 1208 01/15/19 2052 01/16/19 0216  NA 141  --  140  K 4.3  --  3.6  CL 112*  --  112*  CO2 18*  --  19*  BUN 37*  --  34*  CREATININE 1.22 1.22 1.11  CALCIUM 8.8*  --  8.0*  GFRNONAA 56* 56* >60  GFRAA >60 >60 >60     Recent Labs    01/15/19 1208 01/16/19 0216  INR 1.2 1.4*   APTT 29  --      Invalid input(s): ABG   I reviewed the patient's prior CT edges from January 2020 as well as his current CT.  Assessment:  1.  Bilateral hydroureteronephrosis down to the bladder.  New onset since January of this year.  Bladder appears thick-walled.  This may be inflammatory or less likely neoplastic in nature.  Despite this, he is still making urine and is still incontinent.  This is an issue because of his sacral decubitus   2.  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  3.  Urinary incontinence despite indwelling Foley catheter  4.  Urinary tract infection  Plan:  1.  I spoke with the patient's son, Tasia CatchingsCraig about this process.  I do not think neoplastic process is causing this, more than likely inflammatory issues from chronic infections due to indwelling Foley  2.  At this point, I do not think we need to change the Foley catheter  3.  I would strongly consider placement of bilateral percutaneous nephrostomy tubes if the patient will be viable long-term.  This will divert the urine from the patient's bladder, limit his incontinence, and allow the catheter to be removed.  4.  I will stop by to check on him tomorrow.  37 minutes spent in the patient's room, more than 30% of this time face-to-face with the patient and son    Pager:[phone]

## 2019-01-17 ENCOUNTER — Other Ambulatory Visit: Payer: Self-pay

## 2019-01-17 LAB — MAGNESIUM: Magnesium: 2 mg/dL (ref 1.7–2.4)

## 2019-01-17 LAB — URINALYSIS, COMPLETE (UACMP) WITH MICROSCOPIC
Bilirubin Urine: NEGATIVE
Glucose, UA: NEGATIVE mg/dL
Ketones, ur: 5 mg/dL — AB
Nitrite: NEGATIVE
Protein, ur: 30 mg/dL — AB
RBC / HPF: >50 RBC/hpf — ABNORMAL HIGH (ref 0–5)
Specific Gravity, Urine: 1.015 (ref 1.005–1.030)
WBC, UA: >50 WBC/hpf — ABNORMAL HIGH (ref 0–5)
pH: 6 (ref 5.0–8.0)

## 2019-01-17 LAB — BASIC METABOLIC PANEL
Anion gap: 6 (ref 5–15)
BUN: 24 mg/dL — ABNORMAL HIGH (ref 8–23)
CO2: 21 mmol/L — ABNORMAL LOW (ref 22–32)
Calcium: 7.9 mg/dL — ABNORMAL LOW (ref 8.9–10.3)
Chloride: 112 mmol/L — ABNORMAL HIGH (ref 98–111)
Creatinine, Ser: 0.66 mg/dL (ref 0.61–1.24)
GFR calc Af Amer: >60 mL/min (ref >60)
GFR calc non Af Amer: >60 mL/min (ref >60)
Glucose, Bld: 89 mg/dL (ref 70–99)
Potassium: 3.1 mmol/L — ABNORMAL LOW (ref 3.5–5.1)
Sodium: 139 mmol/L (ref 135–145)

## 2019-01-17 LAB — CBC
HCT: 30.5 % — ABNORMAL LOW (ref 39.0–52.0)
Hemoglobin: 9.6 g/dL — ABNORMAL LOW (ref 13.0–17.0)
MCH: 32 pg (ref 26.0–34.0)
MCHC: 31.5 g/dL (ref 30.0–36.0)
MCV: 101.7 fL — ABNORMAL HIGH (ref 80.0–100.0)
Platelets: 183 10*3/uL (ref 150–400)
RBC: 3 MIL/uL — ABNORMAL LOW (ref 4.22–5.81)
RDW: 12.8 % (ref 11.5–15.5)
WBC: 18.9 10*3/uL — ABNORMAL HIGH (ref 4.0–10.5)
nRBC: 0 % (ref 0.0–0.2)

## 2019-01-17 MED ORDER — TRAZODONE HCL 50 MG PO TABS
50.0000 mg | ORAL_TABLET | Freq: Once | ORAL | Status: AC
  Administered 2019-01-17: 22:00:00 50 mg via ORAL
  Filled 2019-01-17: qty 1

## 2019-01-17 MED ORDER — CHLORHEXIDINE GLUCONATE CLOTH 2 % EX PADS
6.0000 | MEDICATED_PAD | Freq: Every day | CUTANEOUS | Status: DC
  Administered 2019-01-18 – 2019-01-22 (×5): 6 via TOPICAL

## 2019-01-17 MED ORDER — POTASSIUM CHLORIDE CRYS ER 20 MEQ PO TBCR
40.0000 meq | EXTENDED_RELEASE_TABLET | Freq: Two times a day (BID) | ORAL | Status: AC
  Administered 2019-01-17 (×2): 40 meq via ORAL
  Filled 2019-01-17 (×2): qty 2

## 2019-01-17 NOTE — Progress Notes (Signed)
Soap sud enema given 40oz with no fecal / stool return

## 2019-01-17 NOTE — Progress Notes (Signed)
PROGRESS NOTE    Mark Mathews    Code Status: DNR  WER:[id number] DOB: 1940-04-16 DOA: 01/15/2019  PCP: Maurice Small, MD    Hospital Summary  This is a 78 year old male with past medical history of Parkinson's, bedbound, chronic indwelling catheter with traumatic hypospadias and recurrent UTI with recent antibiotic use with noted loose stools, sacral decubitus ulcer who presented to the ED 11/13 with altered mental status x1 day.  Patient was admitted to stepdown unit for severe sepsis with tachycardia, tachypnea, lactic acidosis 4.3 ->>>1.8, altered mental status, WBC 30.  Initially with concern for urinary source versus GI source.  Started on broad-spectrum antibiotics with cefepime/vancomycin/Flagyl as well as IV fluid resuscitation. CT abdomen pelvis in ED showed new onset bilateral hydroureteronephrosis down to bladder as well as stercoral colitis and large volume fecal impaction.  Cultures positive for Proteus and antibiotics changed to ceftriaxone monotherapy.  Urology was consulted who recommended bilateral PCN if patient is viable long-term and did not recommend Foley catheter change.  Manual disimpaction was attempted by nursing but unsuccessful on 11/14.  He was started on MiraLAX.  Later in the day 11/14 patient noted to have persistently low BP in 80s/30s and given IV fluid bolus.  Transfer to Tele-med floor 11/15  A & P   Principal Problem:   Severe sepsis (HCC) Active Problems:   Pressure injury of skin   Dementia with behavioral disturbance (HCC)   Bacteremia   Acute metabolic encephalopathy secondary to Proteus bacteremia and severe sepsis, likely urinary source febrile, tachycardic, tachypneic, altered mental status, lactic acid 4.3 admission-> 2.5-> 3.1->1.8.  LR in ED and NS maintenance. Covid negative. Received vancomycin/cefepime in ED.  Antibiotics broadened to cefepime/vancomycin/Flagyl and changed to ceftriaxone monotherapy with resulted blood cultures.  Significant improvement today.  -repeat blood cultures -continue ceftriaxone -transfer to tele med floor -Consider palliative care consult for goals of care discussion.  Patient was previously on hospice.  DNR per son  Bilateral hydronephrosis -Urology consulted: recommended bilateral PCN if patient is viable long-term which would allow for catheter removal. did not recommend Foley catheter change. -follow up urology recommendations  Long-term indwelling Foley catheter with ectopic urethral meatus/hypospadias  -Plan as above  Large volume fecal impaction with stercoral colitis failed manual disimpaction.  No indication for surgery - continue Miralax and soap suds enemas until patient has significant BM -If no improvement, consult GI for disimpaction  Recurrent UTI in setting of chronic indwelling catheter completed course of Keflex at the end of September and course of Macrobid at the end of October.  Proteus mirabilis UTI 11/26/2018 -Plan as above  AKI secondary to severe sepsis resolved with fluids  Parkinson's continue Sinemet IR to prevent withdrawal.  seen by SLP -Hold remaining home meds  Nongap metabolic acidosis secondary to sepsis, AKI and hyperchloremia.  improved -Monitor  DVT prophylaxis: Lovenox Diet: dysphagia 1 Family Communication: Patient's son has been updated by phone yesterday Disposition Plan: transfer to tele med. PT/OT eval  Consultants  Urology PT/OT  Procedures  Manual disimpaction-unsuccessful  Antibiotics  Cefepime/vancomycin/Flagyl 11/13-> 11/14 Ceftriaxone 11/14->      Subjective   Patient more alert today and responds appropriately to questioning. Denies any complaints  Objective   Vitals:   01/17/19 1000 01/17/19 1100 01/17/19 1200 01/17/19 1400  BP: 121/69   119/65  Pulse: 66 73  (!) 55  Resp:      Temp:   99 F (37.2 C)   TempSrc:      SpO2:  100% 100%  100%  Weight:      Height:        Intake/Output Summary (Last 24  hours) at 01/17/2019 1926 Last data filed at 01/17/2019 1500 Gross per 24 hour  Intake 448.91 ml  Output 1126 ml  Net -677.09 ml   Filed Weights   01/15/19 1205  Weight: 61.2 kg    Examination:  Physical Exam Vitals signs and nursing note reviewed.  Constitutional:      Comments: Frail elderly male  HENT:     Head: Normocephalic and atraumatic.     Mouth/Throat:     Mouth: Mucous membranes are moist.  Eyes:     Extraocular Movements: Extraocular movements intact.  Cardiovascular:     Rate and Rhythm: Normal rate and regular rhythm.  Pulmonary:     Effort: Pulmonary effort is normal.     Breath sounds: Normal breath sounds.  Abdominal:     General: Abdomen is flat.     Palpations: Abdomen is soft.  Genitourinary:    Comments: Catheter in place Musculoskeletal:        General: No swelling or tenderness.  Neurological:     Mental Status: He is alert.     Comments: AAOx2 person and place, not time  Psychiatric:        Mood and Affect: Mood normal.        Behavior: Behavior normal.     Data Reviewed: I have personally reviewed following labs and imaging studies  CBC: Recent Labs  Lab 01/15/19 1208 01/15/19 2052 01/16/19 0216 01/17/19 0230  WBC 5.8 30.5* 28.8* 18.9*  NEUTROABS 5.4  --   --   --   HGB 11.9* 10.6* 10.6* 9.6*  HCT 37.6* 33.5* 33.5* 30.5*  MCV 100.0 102.8* 100.0 101.7*  PLT 269 205 231 270   Basic Metabolic Panel: Recent Labs  Lab 01/15/19 1208 01/15/19 2052 01/16/19 0216 01/17/19 0230  NA 141  --  140 139  K 4.3  --  3.6 3.1*  CL 112*  --  112* 112*  CO2 18*  --  19* 21*  GLUCOSE 94  --  90 89  BUN 37*  --  34* 24*  CREATININE 1.22 1.22 1.11 0.66  CALCIUM 8.8*  --  8.0* 7.9*  MG  --   --   --  2.0   GFR: Estimated Creatinine Clearance: 65.9 mL/min (by C-G formula based on SCr of 0.66 mg/dL). Liver Function Tests: Recent Labs  Lab 01/15/19 1208 01/16/19 0216  AST 45* 88*  ALT 12 61*  ALKPHOS 55 56  BILITOT 0.9 0.7  PROT  7.0 6.2*  ALBUMIN 3.2* 2.7*   No results for input(s): LIPASE, AMYLASE in the last 168 hours. No results for input(s): AMMONIA in the last 168 hours. Coagulation Profile: Recent Labs  Lab 01/15/19 1208 01/16/19 0216  INR 1.2 1.4*   Cardiac Enzymes: No results for input(s): CKTOTAL, CKMB, CKMBINDEX, TROPONINI in the last 168 hours. BNP (last 3 results) No results for input(s): PROBNP in the last 8760 hours. HbA1C: No results for input(s): HGBA1C in the last 72 hours. CBG: No results for input(s): GLUCAP in the last 168 hours. Lipid Profile: No results for input(s): CHOL, HDL, LDLCALC, TRIG, CHOLHDL, LDLDIRECT in the last 72 hours. Thyroid Function Tests: No results for input(s): TSH, T4TOTAL, FREET4, T3FREE, THYROIDAB in the last 72 hours. Anemia Panel: No results for input(s): VITAMINB12, FOLATE, FERRITIN, TIBC, IRON, RETICCTPCT in the last 72 hours. Sepsis Labs: Recent  Labs  Lab 01/15/19 1202 01/15/19 1408 01/15/19 1733 01/15/19 2052 01/16/19 0216  PROCALCITON  --   --   --   --  63.62  LATICACIDVEN 4.3* 2.5* 3.1* 1.8  --     Recent Results (from the past 240 hour(s))  Culture, blood (Routine x 2)     Status: Abnormal (Preliminary result)   Collection Time: 01/15/19 12:00 PM   Specimen: BLOOD  Result Value Ref Range Status   Specimen Description   Final    BLOOD LEFT ANTECUBITAL Performed at [facility], [address] [place], [place] [address]    Special Requests   Final    BOTTLES DRAWN AEROBIC AND ANAEROBIC Blood Culture adequate volume Performed at [facility], [address], [place], [place] [address]    Culture  Setup Time   Final    IN BOTH AEROBIC AND ANAEROBIC BOTTLES GRAM VARIABLE ROD CRITICAL RESULT CALLED TO, READ BACK BY AND VERIFIED WITH: B GREEN PHARMD 01/16/19 0308 JDW Performed at [facility], [address] [place], [place] [address]    Culture PROTEUS MIRABILIS (A)  Final   Report Status  PENDING  Incomplete  Blood Culture ID Panel (Reflexed)     Status: Abnormal   Collection Time: 01/15/19 12:00 PM  Result Value Ref Range Status   Enterococcus species NOT DETECTED NOT DETECTED Final   Listeria monocytogenes NOT DETECTED NOT DETECTED Final   Staphylococcus species NOT DETECTED NOT DETECTED Final   Staphylococcus aureus (BCID) NOT DETECTED NOT DETECTED Final   Streptococcus species NOT DETECTED NOT DETECTED Final   Streptococcus agalactiae NOT DETECTED NOT DETECTED Final   Streptococcus pneumoniae NOT DETECTED NOT DETECTED Final   Streptococcus pyogenes NOT DETECTED NOT DETECTED Final   Acinetobacter baumannii NOT DETECTED NOT DETECTED Final   Enterobacteriaceae species DETECTED (A) NOT DETECTED Final    Comment: Enterobacteriaceae represent a large family of gram-negative bacteria, not a single organism. CRITICAL RESULT CALLED TO, READ BACK BY AND VERIFIED WITH: B GREEN PHARMD 01/16/19 0308 JDW    Enterobacter cloacae complex NOT DETECTED NOT DETECTED Final   Escherichia coli NOT DETECTED NOT DETECTED Final   Klebsiella oxytoca NOT DETECTED NOT DETECTED Final   Klebsiella pneumoniae NOT DETECTED NOT DETECTED Final   Proteus species DETECTED (A) NOT DETECTED Final    Comment: CRITICAL RESULT CALLED TO, READ BACK BY AND VERIFIED WITH: B GREEN PHARMD 01/16/19 0308 JDW    Serratia marcescens NOT DETECTED NOT DETECTED Final   Carbapenem resistance NOT DETECTED NOT DETECTED Final   Haemophilus influenzae NOT DETECTED NOT DETECTED Final   Neisseria meningitidis NOT DETECTED NOT DETECTED Final   Pseudomonas aeruginosa NOT DETECTED NOT DETECTED Final   Candida albicans NOT DETECTED NOT DETECTED Final   Candida glabrata NOT DETECTED NOT DETECTED Final   Candida krusei NOT DETECTED NOT DETECTED Final   Candida parapsilosis NOT DETECTED NOT DETECTED Final   Candida tropicalis NOT DETECTED NOT DETECTED Final    Comment: Performed at [facility], [address]  [place], [place] [address]  Culture, blood (Routine x 2)     Status: Abnormal (Preliminary result)   Collection Time: 01/15/19 12:02 PM   Specimen: BLOOD RIGHT FOREARM  Result Value Ref Range Status   Specimen Description   Final    BLOOD RIGHT FOREARM Performed at [facility], [address], [place], [place] [address]    Special Requests   Final    BOTTLES  DRAWN AEROBIC AND ANAEROBIC Blood Culture results may not be optimal due to an excessive volume of blood received in culture bottles Performed at [facility], [address] [place], [place] [address]    Culture  Setup Time   Final    IN BOTH AEROBIC AND ANAEROBIC BOTTLES GRAM VARIABLE ROD CRITICAL VALUE NOTED.  VALUE IS CONSISTENT WITH PREVIOUSLY REPORTED AND CALLED VALUE. Performed at [facility], [address] [place], [place] [address]    Culture PROTEUS MIRABILIS (A)  Final   Report Status PENDING  Incomplete  SARS CORONAVIRUS 2 (TAT 6-24 HRS) Nasopharyngeal Nasopharyngeal Swab     Status: None   Collection Time: 01/15/19 12:31 PM   Specimen: Nasopharyngeal Swab  Result Value Ref Range Status   SARS Coronavirus 2 NEGATIVE NEGATIVE Final    Comment: (NOTE) SARS-CoV-2 target nucleic acids are NOT DETECTED. The SARS-CoV-2 RNA is generally detectable in upper and lower respiratory specimens during the acute phase of infection. Negative results do not preclude SARS-CoV-2 infection, do not rule out co-infections with other pathogens, and should not be used as the sole basis for treatment or other patient management decisions. Negative results must be combined with clinical observations, patient history, and epidemiological information. The expected result is Negative. Fact Sheet for Patients: HairSlick.nohttps://www.fda.gov/media/138098/download Fact Sheet for Healthcare Providers: quierodirigir.comhttps://www.fda.gov/media/138095/download This test is not yet approved or cleared by the [place] FDA and   has been authorized for detection and/or diagnosis of SARS-CoV-2 by FDA under an Emergency Use Authorization (EUA). This EUA will remain  in effect (meaning this test can be used) for the duration of the COVID-19 declaration under Section 56 4(b)(1) of the Act, 21 U.S.C. section 360bbb-3(b)(1), unless the authorization is terminated or revoked sooner. Performed at [facility], [address] [place], [place] [address]          Radiology Studies: Ct Abdomen Pelvis W Contrast  Result Date: 01/15/2019 CLINICAL DATA:  Sepsis. Indwelling catheter and prior urinary tract infection. Diarrhea. EXAM: CT ABDOMEN AND PELVIS WITH CONTRAST TECHNIQUE: Multidetector CT imaging of the abdomen and pelvis was performed using the standard protocol following bolus administration of intravenous contrast. CONTRAST:  100mL OMNIPAQUE IOHEXOL 300 MG/ML  SOLN COMPARISON:  CT 04/02/2018 FINDINGS: Lower chest: Mild bilateral pleural thickening with trace left pleural effusion. Mild basilar atelectasis. Coronary artery calcifications. Partially motion obscured. Hepatobiliary: No focal liver abnormality is seen. No gallstones, gallbladder wall thickening, or biliary dilatation. Pancreas: Motion obscured.  No ductal dilatation or inflammation. Spleen: Normal in size without focal abnormality. Adrenals/Urinary Tract: No adrenal nodule. Bilateral hydroureteronephrosis, new from prior exam. There is right periureteric stranding and enhancement of the right renal collecting system. Both ureters are dilated to the bladder insertion. Bilateral perinephric edema. Diminished excretion on delayed phase imaging from both kidneys. Foley catheter within the urinary bladder. Diffuse urinary bladder wall thickening, enhancement, and perivesicular edema. Few scattered small bladder diverticula. Stomach/Bowel: Large volume of stool distends the rectum, rectal distention of 8.6 cm. Mild anorectal wall thickening and perirectal edema.  No definite pneumatosis. Large volume of stool in the sigmoid colon. Distal descending colon is tortuous and decompressed. Small to moderate volume of stool throughout the remainder the colon. Appendix not visualized, surgically absent per history. Few fluid-filled small bowel loops without obstruction. Inflammatory changes in the right pericolic gutter, favored to be reactive secondary to adjacent ureteral inflammation. Fluid-filled duodenum. Moderate hiatal hernia with small volume of fluid in the stomach. No obvious  source of perforation. Vascular/Lymphatic: Moderate aorto bi-iliac atherosclerosis. Diffuse vascular tortuosity. Retroaortic left renal vein. Patent portal vein. Limited assessment for adenopathy given patient motion. No bulky abdominopelvic lymph nodes. Reproductive: Prostate is unremarkable. Other: Tiny focus of air adjacent to the gallbladder and left lobe of the liver, this abuts the hepatic flexure of the colon is felt to represent a colonic fold, series 2, image 28. Generalized retroperitoneal stranding suggesting urinary tract infection. Prior lower abdominal wall hernia repair with tacks. No evidence of intra-abdominal abscess. Musculoskeletal: Unchanged moderately severe L3 compression fracture. Multilevel degenerative change in the spine. Bones diffusely under mineralized. Improve subcutaneous edema of the posterior soft tissues. Previous soft tissue gas in the retrosacral soft tissues has resolved, there is residual soft tissue thickening. IMPRESSION: 1. Findings consistent with urinary tract infection with cystitis and ascending urinary tract infection on the right and likely left. Inflammatory changes about the right renal collecting system. No renal abscess. 2. Large volume of stool in the distal sigmoid colon and rectum with mild anorectal wall thickening and perirectal/pericolonic edema, suspicious for fecal impaction or stercoral colitis. 3. Tiny focus of air adjacent to the  gallbladder is felt to be contiguous with the hepatic flexure of the colon rather than a focus of free air, as there is no evidence of bowel perforation. 4. Mild bilateral pleural thickening with trace left pleural effusion. Mild basilar atelectasis. Aortic Atherosclerosis (ICD10-I70.0). Electronically Signed   By: Narda Rutherford M.D.   On: 01/15/2019 20:09        Scheduled Meds:  carbidopa-levodopa  1.5 tablet Oral 5 X Daily   [START ON 01/18/2019] Chlorhexidine Gluconate Cloth  6 each Topical Daily   enoxaparin (LOVENOX) injection  40 mg Subcutaneous Q24H   mouth rinse  15 mL Mouth Rinse BID   polyethylene glycol  17 g Oral Daily   potassium chloride  40 mEq Oral BID   Continuous Infusions:  cefTRIAXone (ROCEPHIN)  IV Stopped (01/17/19 0812)     LOS: 2 days    Time spent: 30 minutes with over 50% of the time coordinating the patient's care    Jae Dire, DO [facility] Pager [phone]  If 7PM-7AM, please contact night-coverage [email or web address] Password [facility] 01/17/2019, 7:26 PM

## 2019-01-18 LAB — CULTURE, BLOOD (ROUTINE X 2): Special Requests: ADEQUATE

## 2019-01-18 LAB — BASIC METABOLIC PANEL
Anion gap: 7 (ref 5–15)
BUN: 15 mg/dL (ref 8–23)
CO2: 23 mmol/L (ref 22–32)
Calcium: 8.4 mg/dL — ABNORMAL LOW (ref 8.9–10.3)
Chloride: 111 mmol/L (ref 98–111)
Creatinine, Ser: 0.75 mg/dL (ref 0.61–1.24)
GFR calc Af Amer: >60 mL/min (ref >60)
GFR calc non Af Amer: >60 mL/min (ref >60)
Glucose, Bld: 89 mg/dL (ref 70–99)
Potassium: 4 mmol/L (ref 3.5–5.1)
Sodium: 141 mmol/L (ref 135–145)

## 2019-01-18 LAB — CBC
HCT: 34 % — ABNORMAL LOW (ref 39.0–52.0)
Hemoglobin: 10.7 g/dL — ABNORMAL LOW (ref 13.0–17.0)
MCH: 31.9 pg (ref 26.0–34.0)
MCHC: 31.5 g/dL (ref 30.0–36.0)
MCV: 101.5 fL — ABNORMAL HIGH (ref 80.0–100.0)
Platelets: 215 10*3/uL (ref 150–400)
RBC: 3.35 MIL/uL — ABNORMAL LOW (ref 4.22–5.81)
RDW: 12.6 % (ref 11.5–15.5)
WBC: 11.4 10*3/uL — ABNORMAL HIGH (ref 4.0–10.5)
nRBC: 0 % (ref 0.0–0.2)

## 2019-01-18 LAB — URINE CULTURE: Culture: NO GROWTH

## 2019-01-18 MED ORDER — CEFAZOLIN SODIUM-DEXTROSE 2-4 GM/100ML-% IV SOLN
2.0000 g | Freq: Three times a day (TID) | INTRAVENOUS | Status: DC
  Administered 2019-01-19 – 2019-01-22 (×10): 2 g via INTRAVENOUS
  Filled 2019-01-18 (×11): qty 100

## 2019-01-18 MED ORDER — BUPIVACAINE HCL (PF) 0.25 % IJ SOLN
INTRAMUSCULAR | Status: AC
  Filled 2019-01-18: qty 30

## 2019-01-18 MED ORDER — CEFAZOLIN SODIUM-DEXTROSE 2-4 GM/100ML-% IV SOLN
2.0000 g | Freq: Three times a day (TID) | INTRAVENOUS | Status: DC
  Filled 2019-01-18: qty 100

## 2019-01-18 MED ORDER — MIRABEGRON ER 25 MG PO TB24
50.0000 mg | ORAL_TABLET | Freq: Every day | ORAL | Status: DC
  Administered 2019-01-18 – 2019-01-22 (×5): 50 mg via ORAL
  Filled 2019-01-18 (×5): qty 2

## 2019-01-18 MED ORDER — GABAPENTIN 100 MG PO CAPS
100.0000 mg | ORAL_CAPSULE | Freq: Three times a day (TID) | ORAL | Status: DC
  Administered 2019-01-18 – 2019-01-19 (×3): 100 mg via ORAL
  Filled 2019-01-18 (×3): qty 1

## 2019-01-18 NOTE — Evaluation (Signed)
Physical Therapy Evaluation Patient Details Name: Mark Mathews MRN: [id number] DOB: 14-Jun-1940 Today's Date: 01/18/2019   History of Present Illness  This is a 78 year old male with past medical history of Parkinson's, bedbound, chronic indwelling catheter with traumatic hypospadias and recurrent UTI with recent antibiotic use with noted loose stools, sacral decubitus ulcer who presented to the ED 11/13 with altered mental status x1 day    Clinical Impression  Pt admitted with above diagnosis. Pt currently with functional limitations due to the deficits listed below (see PT Problem List). Pt will benefit from skilled PT to increase their independence and safety with mobility to allow discharge to the venue listed below.  Pt close to baseline status, but will work on bed mobility and increasing use of rail to A with turning to decreased burden on caregivers.  Pt was receiving HHPT per son and recommend continuing with this.      Follow Up Recommendations Home health PT;Supervision/Assistance - 24 hour    Equipment Recommendations  Other (comment)(Son reports an outside MD was working on getting pt a reclining w/c.)    Recommendations for Other Services       Precautions / Restrictions Precautions Precautions: Fall Restrictions Weight Bearing Restrictions: No      Mobility  Bed Mobility Overal bed mobility: Needs Assistance Bed Mobility: Rolling Rolling: Total assist;+2 for physical assistance         General bed mobility comments: Use of bed pads to A pt with rolling and pt attempting to use hand rails to A.  Once he was in sidelying he could use handrail to maintain sidelying position, but unable to use rail to assist in roll.  Transfers                    Ambulation/Gait                Stairs            Wheelchair Mobility    Modified Rankin (Stroke Patients Only)       Balance                                              Pertinent Vitals/Pain Pain Assessment: Faces Faces Pain Scale: Hurts little more Pain Location: R hip/thigh Pain Descriptors / Indicators: Sore Pain Intervention(s): Limited activity within patient's tolerance;Repositioned    Home Living Family/patient expects to be discharged to:: Private residence Living Arrangements: Children Available Help at Discharge: Family;Available 24 hours/day Type of Home: House         Home Equipment: Hospital bed;Transport chair;Other (comment)(air mattress overlay, hoyer lift) Additional Comments: Spoke to son on the phone who states pt is primariy bed bound.  MD was looking into getting him a reclining w/c.    Prior Function Level of Independence: Needs assistance   Gait / Transfers Assistance Needed: MAX A for turning, Hoyer lift for OOB  ADL's / Homemaking Assistance Needed: Feeds self finger foods.  Fed if not a finger food.        Hand Dominance        Extremity/Trunk Assessment   Upper Extremity Assessment Upper Extremity Assessment: Generalized weakness(tremors noted)    Lower Extremity Assessment Lower Extremity Assessment: RLE deficits/detail;LLE deficits/detail RLE Deficits / Details: Contracture of hip and knee. Could tolerate knee extension 50%, but hip maintained flexed position RLE Coordination: decreased  gross motor LLE Deficits / Details: Contracture of hip and knee, Could tolerate more PROM to hip and knee compared to R to achieve 75% of normal range. LLE Coordination: decreased gross motor       Communication   Communication: No difficulties  Cognition Arousal/Alertness: Awake/alert Behavior During Therapy: WFL for tasks assessed/performed Overall Cognitive Status: Impaired/Different from baseline Area of Impairment: Memory;Following commands;Safety/judgement;Awareness                     Memory: Decreased short-term memory Following Commands: Follows one step commands with increased  time Safety/Judgement: Decreased awareness of deficits Awareness: Intellectual   General Comments: Pt could not accurately or consistently answer questions regarding home situation.      General Comments      Exercises     Assessment/Plan    PT Assessment Patient needs continued PT services  PT Problem List Decreased strength;Decreased range of motion;Decreased activity tolerance;Decreased mobility;Decreased skin integrity;Impaired tone       PT Treatment Interventions Functional mobility training;Therapeutic activities;Therapeutic exercise;Neuromuscular re-education;Patient/family education    PT Goals (Current goals can be found in the Care Plan section)  Acute Rehab PT Goals Patient Stated Goal: Pt to return home PT Goal Formulation: With family Time For Goal Achievement: 02/01/19 Potential to Achieve Goals: Fair    Frequency Min 2X/week   Barriers to discharge        Co-evaluation               AM-PAC PT "6 Clicks" Mobility  Outcome Measure Help needed turning from your back to your side while in a flat bed without using bedrails?: Total Help needed moving from lying on your back to sitting on the side of a flat bed without using bedrails?: Total Help needed moving to and from a bed to a chair (including a wheelchair)?: Total Help needed standing up from a chair using your arms (e.g., wheelchair or bedside chair)?: Total Help needed to walk in hospital room?: Total Help needed climbing 3-5 steps with a railing? : Total 6 Click Score: 6    End of Session   Activity Tolerance: Patient tolerated treatment well Patient left: in bed;with nursing/sitter in room Nurse Communication: Mobility status PT Visit Diagnosis: Other abnormalities of gait and mobility (R26.89)    Time: 2703-5009 PT Time Calculation (min) (ACUTE ONLY): 18 min   Charges:   PT Evaluation $PT Eval Low Complexity: 1 Low          Mark Mathews, [place] Pager  [phone] 01/18/2019   Mark Mathews 01/18/2019, 10:04 AM

## 2019-01-18 NOTE — Progress Notes (Signed)
  Speech Language Pathology Treatment: Dysphagia  Patient Details Name: Mark Mathews MRN: [id number] DOB: [birth year] Today's Date: 01/18/2019 Time: 1572-6203 SLP Time Calculation (min) (ACUTE ONLY): 25 min  Assessment / Plan / Recommendation Clinical Impression  Pt seen at bedside for assessment of diet tolerance and to determine appropriateness for advanced consistencies. RNs report no difficulty with Dys 1 (puree) solids, thin liquids, or crushed meds. Pt was awake and alert upon arrival of SLP. He indicates that he is able to feed himself, but it "takes forever and half of it ends up on me or the floor". Pt tolerated trials of graham cracker softened in pudding and thin liquids via straw without overt s/s aspiration.   Recommend advancing to dys 2 solids and thin liquids. Safe swallow precautions posted at [facility]. Recommend pt to be fed by staff for energy conservation, as aspiration risk increases with fatigue, which is common in patients with Parkinson's disease. SLP informed RN and MD of diet recommendations, and will continue to follow to assess tolerance of advanced consistencies.     HPI HPI: 78 year old male with past medical history of Parkinson's, bedbound, chronic indwelling catheter UTI, sacral decubitus ulcer who presented to the ED today with altered mental status x1 day      SLP Plan  Continue with current plan of care       Recommendations  Diet recommendations: Dysphagia 2 (fine chop);Thin liquid Liquids provided via: Straw Medication Administration: Crushed with puree Supervision: Staff to assist with self feeding;Full supervision/cueing for compensatory strategies Compensations: Minimize environmental distractions;Slow rate;Small sips/bites Postural Changes and/or Swallow Maneuvers: Seated upright 90 degrees;Upright 30-60 min after meal                Oral Care Recommendations: Oral care BID Follow up Recommendations: 24 hour supervision/assistance SLP Visit  Diagnosis: Dysphagia, unspecified (R13.10) Plan: Continue with current plan of care       [place], [facility], [place] Pathologist Office: [phone] Pager: [phone]  Shonna Chock 01/18/2019, 4:23 PM

## 2019-01-18 NOTE — Consult Note (Signed)
WOC Nurse Consult Note: Patient receiving care in [place]. Reason for Consult: "Decubitus pressure ulcer" Wound type: stage 2 PI to coccyx/sacrum Pressure Injury POA: Yes for weeks per patient.  Was much larger per patient. Measurement: 3.3 cm x 4 cm x 0.1 cm Wound bed: 100% pink Drainage (amount, consistency, odor) yellow on existing foam dressing Periwound: intact Dressing procedure/placement/frequency: Apply a small piece of Xeroform gauze Kellie Simmering [phone]) over wound, then cover with a foam dressing.  Change daily. Patient states he turns himself in bed and does not want an air mattress. Monitor the wound area(s) for worsening of condition such as: Signs/symptoms of infection,  Increase in size,  Development of or worsening of odor, Development of pain, or increased pain at the affected locations.  Notify the medical team if any of these develop.  Thank you for the consult.  Discussed plan of care with the patient.  [place] nurse will not follow at this time.  Please re-consult the [place] team if needed.  Val Riles, RN, MSN, CWOCN, CNS-BC, pager [phone]

## 2019-01-18 NOTE — Care Management Important Message (Signed)
Important Message  Patient Details IM Letter given to [facility] SW to present to the Patient Name: Mark Mathews MRN: [id number] Date of Birth: [birth year]   Medicare Important Message Given:  Yes     Kerin Salen 01/18/2019, 12:20 PM

## 2019-01-18 NOTE — Progress Notes (Signed)
PROGRESS NOTE    Mark Mathews    Code Status: DNR  ZOX:[id number] DOB: [birth year] DOA: 01/15/2019  PCP: Maurice Small, MD    Hospital Summary  This is a 78 year old male with past medical history of Parkinson's, bedbound, chronic indwelling catheter with traumatic hypospadias and recurrent UTI with recent antibiotic use with noted loose stools, sacral decubitus ulcer who presented to the ED 11/13 with altered mental status x1 day.  Patient was admitted to stepdown unit for severe sepsis with tachycardia, tachypnea, lactic acidosis 4.3 ->>>1.8, altered mental status, WBC 30.  Initially with concern for urinary source versus GI source.  Started on broad-spectrum antibiotics with cefepime/vancomycin/Flagyl as well as IV fluid resuscitation. CT abdomen pelvis in ED showed new onset bilateral hydroureteronephrosis down to bladder as well as stercoral colitis and large volume fecal impaction.  Cultures positive for Proteus and antibiotics changed to ceftriaxone monotherapy.  Urology was consulted who recommended bilateral PCN if patient is viable long-term and did not recommend Foley catheter change.  Manual disimpaction was attempted by nursing but unsuccessful on 11/14.  He was started on MiraLAX.  Later in the day 11/14 patient noted to have persistently low BP in 80s/30s and given IV fluid bolus.  Transfer to Tele-med floor 11/15  A & P   Principal Problem:   Severe sepsis (HCC) Active Problems:   Pressure injury of skin   Dementia with behavioral disturbance (HCC)   Bacteremia   Acute metabolic encephalopathy secondary to Proteus bacteremia and severe sepsis, likely urinary source febrile, tachycardic, tachypneic, altered mental status, lactic acid 4.3 admission-> 2.5-> 3.1->1.8.  LR in ED and NS maintenance. Covid negative. Received vancomycin/cefepime in ED.  Antibiotics broadened to cefepime/vancomycin/Flagyl and changed to ceftriaxone monotherapy with resulted blood cultures.   Continues to improve -Follow-up repeat blood cultures -De-escalate to Ancef per pharmacy  Bilateral hydronephrosis -Urology consulted: recommended bilateral PCN if patient is viable long-term which would allow for catheter removal. did not recommend Foley catheter change. 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 if no stools  Recurrent UTI in setting of chronic indwelling catheter completed course of Keflex at the end of September and course of Macrobid at the end of October.  Proteus mirabilis UTI 11/26/2018 -Plan as above  AKI secondary to severe sepsis resolved with fluids  Parkinson's continue Sinemet IR to prevent withdrawal.  seen by SLP -Hold remaining home meds  Nongap metabolic acidosis secondary to sepsis, AKI and hyperchloremia solved  DVT prophylaxis: Lovenox Diet: Faylene Million to dysphagia 2 Family Communication: No family at bedside Disposition Plan: Pending medical stability, likely DC in 1 to 2 days.  Will need home health PT at discharge  Consultants  Urology PT/OT  Procedures  Manual disimpaction-unsuccessful  Antibiotics  Cefepime/vancomycin/Flagyl 11/13-> 11/14 Ceftriaxone 11/14->11/16 Ancef 11/16->      Subjective   Patient is much more alert today.  Denies any complaints at this time.  10 point ROS negative at bedside  Objective   Vitals:   01/17/19 1400 01/17/19 2013 01/18/19 0547 01/18/19 1341  BP: 119/65 94/66 109/62 (!) 116/59  Pulse: (!) 55 80 67 70  Resp:  Temp:  98.8 F (37.1 C) 98.3 F (36.8 C) 98 F (36.7 C)  TempSrc:  Oral Oral Oral  SpO2: 100% 100% 100% 100%  Weight:  Height:        Intake/Output Summary (Last 24 hours) at 01/18/2019 1920 Last data filed at 01/18/2019 1800 Gross per  24 hour  Intake 900 ml  Output 1600 ml  Net -700 ml   Filed Weights   01/15/19 1205  Weight: 61.2 kg    Examination:  Physical Exam Vitals signs and nursing note reviewed.  Constitutional:      Comments: Frail elderly male Laying on side  HENT:     Head: Normocephalic and atraumatic.     Mouth/Throat:     Pharynx: Oropharynx is clear.  Eyes:     Extraocular Movements: Extraocular movements intact.  Cardiovascular:     Rate and Rhythm: Normal rate and regular rhythm.  Pulmonary:     Effort: Pulmonary effort is normal.     Breath sounds: Normal breath sounds.  Abdominal:     General: Abdomen is flat. There is no distension.  Genitourinary:    Comments: Catheter in place Musculoskeletal:        General: No swelling or tenderness.  Neurological:     Mental Status: He is alert.     Comments: AO x2 person and place not time answers questions appropriately and asks appropriate questions  Psychiatric:        Mood and Affect: Mood normal.        Behavior: Behavior normal.     Data Reviewed: I have personally reviewed following labs and imaging studies  CBC: Recent Labs  Lab 01/15/19 1208 01/15/19 2052 01/16/19 0216 01/17/19 0230 01/18/19 0644  WBC 5.8 30.5* 28.8* 18.9* 11.4*  NEUTROABS 5.4  --   --   --   --   HGB 11.9* 10.6* 10.6* 9.6* 10.7*  HCT 37.6* 33.5* 33.5* 30.5* 34.0*  MCV 100.0 102.8* 100.0 101.7* 101.5*  PLT 269 205 231 183 950   Basic Metabolic Panel: Recent Labs  Lab 01/15/19 1208 01/15/19 2052 01/16/19 0216 01/17/19 0230 01/18/19 0644  NA 141  --  140 139 141  K 4.3  --  3.6 3.1* 4.0  CL 112*  --  112* 112* 111  CO2 18*  --  19* 21* 23  GLUCOSE 94  --  90 89 89  BUN 37*  --  34* 24* 15  CREATININE 1.22 1.22 1.11 0.66 0.75  CALCIUM 8.8*  --  8.0* 7.9* 8.4*  MG  --   --   --  2.0  --    GFR: Estimated Creatinine Clearance: 65.9 mL/min (by C-G formula based on SCr of 0.75 mg/dL). Liver Function Tests: Recent Labs  Lab 01/15/19 1208  01/16/19 0216  AST 45* 88*  ALT 12 61*  ALKPHOS 55 56  BILITOT 0.9 0.7  PROT 7.0 6.2*  ALBUMIN 3.2* 2.7*   No results for input(s): LIPASE, AMYLASE in the last 168 hours. No results for input(s): AMMONIA in the last 168 hours. Coagulation Profile: Recent Labs  Lab 01/15/19 1208 01/16/19 0216  INR 1.2 1.4*   Cardiac Enzymes: No results for input(s): CKTOTAL, CKMB, CKMBINDEX, TROPONINI in the last 168 hours. BNP (last 3 results) No results for input(s): PROBNP in the last 8760 hours. HbA1C: No results for input(s): HGBA1C in the last 72 hours. CBG: No results for input(s): GLUCAP in the last 168 hours. Lipid Profile: No results for input(s): CHOL, HDL, LDLCALC, TRIG, CHOLHDL, LDLDIRECT in the last 72 hours. Thyroid Function Tests: No results for input(s): TSH, T4TOTAL, FREET4, T3FREE, THYROIDAB in the last 72 hours. Anemia Panel:  No results for input(s): VITAMINB12, FOLATE, FERRITIN, TIBC, IRON, RETICCTPCT in the last 72 hours. Sepsis Labs: Recent Labs  Lab 01/15/19 1202 01/15/19 1408 01/15/19 1733 01/15/19 2052 01/16/19 0216  PROCALCITON  --   --   --   --  63.62  LATICACIDVEN 4.3* 2.5* 3.1* 1.8  --     Recent Results (from the past 240 hour(s))  Culture, blood (Routine x 2)     Status: Abnormal   Collection Time: 01/15/19 12:00 PM   Specimen: BLOOD  Result Value Ref Range Status   Specimen Description   Final    BLOOD LEFT ANTECUBITAL Performed at [facility], [address] [place], [place] [address]    Special Requests   Final    BOTTLES DRAWN AEROBIC AND ANAEROBIC Blood Culture adequate volume Performed at [facility], [address] [place], [place] [address]    Culture  Setup Time   Final    IN BOTH AEROBIC AND ANAEROBIC BOTTLES GRAM VARIABLE ROD CRITICAL RESULT CALLED TO, READ BACK BY AND VERIFIED WITH: B GREEN PHARMD 01/16/19 0308 JDW    Culture (A)  Final    PROTEUS MIRABILIS SUSCEPTIBILITIES PERFORMED ON  PREVIOUS CULTURE WITHIN THE LAST 5 DAYS. Performed at [facility], [address] [place], [place] [address]    Report Status 01/18/2019 FINAL  Final  Blood Culture ID Panel (Reflexed)     Status: Abnormal   Collection Time: 01/15/19 12:00 PM  Result Value Ref Range Status   Enterococcus species NOT DETECTED NOT DETECTED Final   Listeria monocytogenes NOT DETECTED NOT DETECTED Final   Staphylococcus species NOT DETECTED NOT DETECTED Final   Staphylococcus aureus (BCID) NOT DETECTED NOT DETECTED Final   Streptococcus species NOT DETECTED NOT DETECTED Final   Streptococcus agalactiae NOT DETECTED NOT DETECTED Final   Streptococcus pneumoniae NOT DETECTED NOT DETECTED Final   Streptococcus pyogenes NOT DETECTED NOT DETECTED Final   Acinetobacter baumannii NOT DETECTED NOT DETECTED Final   Enterobacteriaceae species DETECTED (A) NOT DETECTED Final    Comment: Enterobacteriaceae represent a large family of gram-negative bacteria, not a single organism. CRITICAL RESULT CALLED TO, READ BACK BY AND VERIFIED WITH: B GREEN PHARMD 01/16/19 0308 JDW    Enterobacter cloacae complex NOT DETECTED NOT DETECTED Final   Escherichia coli NOT DETECTED NOT DETECTED Final   Klebsiella oxytoca NOT DETECTED NOT DETECTED Final   Klebsiella pneumoniae NOT DETECTED NOT DETECTED Final   Proteus species DETECTED (A) NOT DETECTED Final    Comment: CRITICAL RESULT CALLED TO, READ BACK BY AND VERIFIED WITH: B GREEN PHARMD 01/16/19 0308 JDW    Serratia marcescens NOT DETECTED NOT DETECTED Final   Carbapenem resistance NOT DETECTED NOT DETECTED Final   Haemophilus influenzae NOT DETECTED NOT DETECTED Final   Neisseria meningitidis NOT DETECTED NOT DETECTED Final   Pseudomonas aeruginosa NOT DETECTED NOT DETECTED Final   Candida albicans NOT DETECTED NOT DETECTED Final   Candida glabrata NOT DETECTED NOT DETECTED Final   Candida krusei NOT DETECTED NOT DETECTED Final   Candida parapsilosis NOT DETECTED NOT  DETECTED Final   Candida tropicalis NOT DETECTED NOT DETECTED Final    Comment: Performed at [facility], [address] [place], [place] [address]  Culture, blood (Routine x 2)     Status: Abnormal   Collection Time: 01/15/19 12:02 PM   Specimen: BLOOD RIGHT FOREARM  Result Value Ref Range Status   Specimen Description   Final  BLOOD RIGHT FOREARM Performed at [facility], [address], [place], [place] [address]    Special Requests   Final    BOTTLES DRAWN AEROBIC AND ANAEROBIC Blood Culture results may not be optimal due to an excessive volume of blood received in culture bottles Performed at [facility], [address], [place], [place] [address]    Culture  Setup Time   Final    IN BOTH AEROBIC AND ANAEROBIC BOTTLES GRAM VARIABLE ROD CRITICAL VALUE NOTED.  VALUE IS CONSISTENT WITH PREVIOUSLY REPORTED AND CALLED VALUE. Performed at [facility], [address], [place], [place] [address]    Culture PROTEUS MIRABILIS (A)  Final   Report Status 01/18/2019 FINAL  Final   Organism ID, Bacteria PROTEUS MIRABILIS  Final      Susceptibility   Proteus mirabilis - MIC*    AMPICILLIN <=2 SENSITIVE Sensitive     CEFAZOLIN <=4 SENSITIVE Sensitive     CEFEPIME <=1 SENSITIVE Sensitive     CEFTAZIDIME <=1 SENSITIVE Sensitive     CEFTRIAXONE <=1 SENSITIVE Sensitive     CIPROFLOXACIN <=0.25 SENSITIVE Sensitive     GENTAMICIN <=1 SENSITIVE Sensitive     IMIPENEM 2 SENSITIVE Sensitive     TRIMETH/SULFA <=20 SENSITIVE Sensitive     AMPICILLIN/SULBACTAM <=2 SENSITIVE Sensitive     PIP/TAZO <=4 SENSITIVE Sensitive     * PROTEUS MIRABILIS  SARS CORONAVIRUS 2 (TAT 6-24 HRS) Nasopharyngeal Nasopharyngeal Swab     Status: None   Collection Time: 01/15/19 12:31 PM   Specimen: Nasopharyngeal Swab  Result Value Ref Range Status   SARS Coronavirus 2 NEGATIVE NEGATIVE Final    Comment: (NOTE) SARS-CoV-2 target nucleic acids are NOT DETECTED.  The SARS-CoV-2 RNA is generally detectable in upper and lower respiratory specimens during the acute phase of infection. Negative results do not preclude SARS-CoV-2 infection, do not rule out co-infections with other pathogens, and should not be used as the sole basis for treatment or other patient management decisions. Negative results must be combined with clinical observations, patient history, and epidemiological information. The expected result is Negative. Fact Sheet for Patients: HairSlick.no Fact Sheet for Healthcare Providers: quierodirigir.com This test is not yet approved or cleared by the [place] FDA and  has been authorized for detection and/or diagnosis of SARS-CoV-2 by FDA under an Emergency Use Authorization (EUA). This EUA will remain  in effect (meaning this test can be used) for the duration of the COVID-19 declaration under Section 56 4(b)(1) of the Act, 21 U.S.C. section 360bbb-3(b)(1), unless the authorization is terminated or revoked sooner. Performed at [facility], [address], [place], [place] [address]   Culture, blood (routine x 2)     Status: None (Preliminary result)   Collection Time: 01/17/19  9:04 AM   Specimen: BLOOD RIGHT HAND  Result Value Ref Range Status   Specimen Description   Final    BLOOD RIGHT HAND Performed at [facility], [address], [place], [place] [address]    Special Requests   Final    BOTTLES DRAWN AEROBIC AND ANAEROBIC Blood Culture adequate volume Performed at [facility], [address], [place], [place] [address]    Culture   Final    NO GROWTH 1 DAY Performed at [facility], [address], [place], [place] [address]    Report Status PENDING  Incomplete  Culture, blood (routine x 2)     Status:  None (Preliminary result)   Collection Time: 01/17/19  9:08 AM   Specimen: BLOOD LEFT HAND  Result Value  Ref Range Status   Specimen Description   Final    BLOOD LEFT HAND Performed at [facility], [address], [place], [place] [address]    Special Requests   Final    BOTTLES DRAWN AEROBIC ONLY Blood Culture adequate volume Performed at [facility] [address], [place], [place] [address]    Culture   Final    NO GROWTH 1 DAY Performed at [facility], [address], [place], [place] [address]    Report Status PENDING  Incomplete         Radiology Studies: No results found.      Scheduled Meds: . carbidopa-levodopa  1.5 tablet Oral 5 X Daily  . Chlorhexidine Gluconate Cloth  6 each Topical Daily  . enoxaparin (LOVENOX) injection  40 mg Subcutaneous Q24H  . gabapentin  100 mg Oral TID  . mouth rinse  15 mL Mouth Rinse BID  . mirabegron ER  50 mg Oral Daily  . polyethylene glycol  17 g Oral Daily   Continuous Infusions: . [START ON 01/19/2019]  ceFAZolin (ANCEF) IV       LOS: 3 days    Time spent: 25 minutes with over 50% of the time coordinating the patient's care    Jae Dire, DO [facility] Pager [phone]  If 7PM-7AM, please contact night-coverage [email or web address] Password [email or web address] 01/18/2019, 7:20 PM

## 2019-01-19 ENCOUNTER — Telehealth: Payer: Self-pay

## 2019-01-19 LAB — MAGNESIUM: Magnesium: 1.7 mg/dL (ref 1.7–2.4)

## 2019-01-19 LAB — BASIC METABOLIC PANEL
Anion gap: 7 (ref 5–15)
BUN: 12 mg/dL (ref 8–23)
CO2: 25 mmol/L (ref 22–32)
Calcium: 8.5 mg/dL — ABNORMAL LOW (ref 8.9–10.3)
Chloride: 105 mmol/L (ref 98–111)
Creatinine, Ser: 0.77 mg/dL (ref 0.61–1.24)
GFR calc Af Amer: >60 mL/min (ref >60)
GFR calc non Af Amer: >60 mL/min (ref >60)
Glucose, Bld: 103 mg/dL — ABNORMAL HIGH (ref 70–99)
Potassium: 3.7 mmol/L (ref 3.5–5.1)
Sodium: 137 mmol/L (ref 135–145)

## 2019-01-19 LAB — CBC
HCT: 34.7 % — ABNORMAL LOW (ref 39.0–52.0)
Hemoglobin: 10.9 g/dL — ABNORMAL LOW (ref 13.0–17.0)
MCH: 31.3 pg (ref 26.0–34.0)
MCHC: 31.4 g/dL (ref 30.0–36.0)
MCV: 99.7 fL (ref 80.0–100.0)
Platelets: 226 10*3/uL (ref 150–400)
RBC: 3.48 MIL/uL — ABNORMAL LOW (ref 4.22–5.81)
RDW: 12.4 % (ref 11.5–15.5)
WBC: 9.6 10*3/uL (ref 4.0–10.5)
nRBC: 0 % (ref 0.0–0.2)

## 2019-01-19 MED ORDER — MINERAL OIL RE ENEM
1.0000 | ENEMA | Freq: Once | RECTAL | Status: DC
  Filled 2019-01-19: qty 1

## 2019-01-19 MED ORDER — LACTULOSE 10 GM/15ML PO SOLN: 20.0000 g | Freq: Two times a day (BID) | ORAL | Status: DC | PRN

## 2019-01-19 MED ORDER — MAGNESIUM SULFATE 2 GM/50ML IV SOLN
2.0000 g | Freq: Once | INTRAVENOUS | Status: AC
  Administered 2019-01-19: 13:00:00 2 g via INTRAVENOUS
  Filled 2019-01-19: qty 50

## 2019-01-19 NOTE — Progress Notes (Signed)
PROGRESS NOTE    Dorinda HillBilly J Falk    Code Status: DNR  ZOX:[id number] DOB: 1940-11-06 DOA: 01/15/2019  PCP: Maurice SmallGriffin, Elaine, MD    Hospital Summary  This is a 78 year old male with past medical history of Parkinson's, bedbound, chronic indwelling catheter with traumatic hypospadias and recurrent UTI with recent antibiotic use with noted loose stools, sacral decubitus ulcer who presented to the ED 11/13 with altered mental status x1 day.  Patient was admitted to stepdown unit for severe sepsis with tachycardia, tachypnea, lactic acidosis 4.3 ->>>1.8, altered mental status, WBC 30.  Initially with concern for urinary source versus GI source.  Started on broad-spectrum antibiotics with cefepime/vancomycin/Flagyl as well as IV fluid resuscitation. CT abdomen pelvis in ED showed new onset bilateral hydroureteronephrosis down to bladder as well as stercoral colitis and large volume fecal impaction.  Cultures positive for Proteus and antibiotics changed to ceftriaxone monotherapy.  Urology was consulted who recommended bilateral PCN if patient is viable long-term and did not recommend Foley catheter change.  Manual disimpaction was attempted by nursing but unsuccessful on 11/14.  He was started on MiraLAX.  Later in the day 11/14 patient noted to have persistently low BP in 80s/30s and given IV fluid bolus.  Transfer to Tele-med floor 11/15  A & P   Principal Problem:   Severe sepsis (HCC) Active Problems:   Pressure injury of skin   Dementia with behavioral disturbance (HCC)   Bacteremia   Acute metabolic encephalopathy secondary to Proteus bacteremia and severe sepsis, likely urinary source on admission: febrile, tachycardic, tachypneic, altered mental status, lactic acid 4.3., Covid negative. Day 5 antibiotics: vancomycin/cefepime in ED, broadened to cefepime/vancomycin/Flagyl and changed to ceftriaxone monotherapy with resulted blood cultures, deescalated to ancef 11/16. More somnolent today,  started on gabapentin yesterday and now with Mg 1.7 -Continue antibiotics -Discontinue gabapentin -Replete Magnesium  General Malaise Gabapentin vs. Persistent bacteremia (afebrile, no leukocytosis, blood cultures from 11/15 negative) vs. Debility vs. Other -Discontinue gabapentin -repeat blood cultures -consider palliative  Bilateral hydronephrosis Urology consulted: recommended bilateral PCN if patient is viable long-term which would allow for catheter removal. did not recommend Foley catheter change. 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 -Continue bowel regimen -Consider contacting GI in a.m. if no stools  Recurrent UTI in setting of chronic indwelling catheter completed course of Keflex at the end of September and course of Macrobid at the end of October.  Proteus mirabilis UTI 11/26/2018 -Plan as above  AKI secondary to severe sepsis resolved with fluids  Parkinson's continue Sinemet IR to prevent withdrawal.  seen by SLP -Hold remaining home meds  Bilateral lower extremity paresthesiasResolved with gabapentin yesterday. 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 -Discontinue gabapentin with new somnolence today as above  Nongap metabolic acidosis secondary to sepsis, AKI and hyperchloremia resolved  DVT prophylaxis: Lovenox Diet: Faylene MillionVance to dysphagia 2 Family Communication: No family at bedside Disposition Plan: Pending medical stability, likely DC in 1 to 2 days.  Will need home health PT at discharge  Consultants  Urology PT/OT  Procedures  Manual disimpaction-unsuccessful  Antibiotics  Cefepime/vancomycin/Flagyl 11/13-> 11/14 Ceftriaxone 11/14->11/16 Ancef 11/16->      Subjective   Complaining of  generalized malaise today new from yesterday and back pain similar to prior in area  of sacral ulcer. No other specific complaints.   Objective   Vitals:   01/18/19 0547 01/18/19 1341 01/18/19 2033 01/19/19 0526  BP: 109/62 (!) 116/59 124/73 130/73  Pulse: 67 70 68 (!) 58  Resp: Temp: 98.3 F (36.8 C) 98 F (36.7 C) 98.6 F (37 C) (!) 97.5 F (36.4 C)  TempSrc: Oral Oral Oral Oral  SpO2: 100% 100% 97% 98%  Weight:      Height:        Intake/Output Summary (Last 24 hours) at 01/19/2019 1024 Last data filed at 01/19/2019 0929 Gross per 24 hour  Intake 840 ml  Output 2425 ml  Net -1585 ml   Filed Weights   01/15/19 1205  Weight: 61.2 kg    Examination:  Physical Exam Vitals signs and nursing note reviewed.  Constitutional:      Comments: Awake but somnolent Answer questions appropriately  HENT:     Head: Normocephalic and atraumatic.     Mouth/Throat:     Mouth: Mucous membranes are moist.  Neck:     Musculoskeletal: Normal range of motion. No neck rigidity.  Cardiovascular:     Rate and Rhythm: Normal rate and regular rhythm.  Pulmonary:     Effort: Pulmonary effort is normal.     Breath sounds: Normal breath sounds.  Abdominal:     General: Abdomen is flat.     Palpations: Abdomen is soft.  Musculoskeletal:        General: No swelling.  Skin:    Comments: Sacral decubitus ulcer with dressing clean dry and intact. No purulent drainage noted.  Neurological:     Comments: Oriented to year and place  Psychiatric:        Thought Content: Thought content normal.     Data Reviewed: I have personally reviewed following labs and imaging studies  CBC: Recent Labs  Lab 01/15/19 1208 01/15/19 2052 01/16/19 0216 01/17/19 0230 01/18/19 0644 01/19/19 0716  WBC 5.8 30.5* 28.8* 18.9* 11.4* 9.6  NEUTROABS 5.4  --   --   --   --   --   HGB 11.9* 10.6* 10.6* 9.6* 10.7* 10.9*  HCT 37.6* 33.5* 33.5* 30.5* 34.0* 34.7*  MCV 100.0 102.8* 100.0 101.7*  101.5* 99.7  PLT 269 205 231 183 215 226   Basic Metabolic Panel: Recent Labs  Lab 01/15/19 1208 01/15/19 2052 01/16/19 0216 01/17/19 0230 01/18/19 0644 01/19/19 0716  NA 141  --  140 139 141 137  K 4.3  --  3.6 3.1* 4.0 3.7  CL 112*  --  112* 112* 111 105  CO2 18*  --  19* 21* 23 25  GLUCOSE 94  --  90 89 89 103*  BUN 37*  --  34* 24* 15 12  CREATININE 1.22 1.22 1.11 0.66 0.75 0.77  CALCIUM 8.8*  --  8.0* 7.9* 8.4* 8.5*  MG  --   --   --  2.0  --  1.7   GFR: Estimated Creatinine Clearance: 65.9 mL/min (by C-G formula based on SCr of 0.77 mg/dL). Liver Function Tests: Recent Labs  Lab 01/15/19 1208 01/16/19 0216  AST 45* 88*  ALT 12 61*  ALKPHOS 55 56  BILITOT 0.9 0.7  PROT 7.0 6.2*  ALBUMIN 3.2* 2.7*   No results for input(s): LIPASE, AMYLASE in the last 168 hours. No results for input(s): AMMONIA in the last 168 hours. Coagulation Profile: Recent Labs  Lab 01/15/19 1208 01/16/19 0216  INR 1.2  1.4*   Cardiac Enzymes: No results for input(s): CKTOTAL, CKMB, CKMBINDEX, TROPONINI in the last 168 hours. BNP (last 3 results) No results for input(s): PROBNP in the last 8760 hours. HbA1C: No results for input(s): HGBA1C in the last 72 hours. CBG: No results for input(s): GLUCAP in the last 168 hours. Lipid Profile: No results for input(s): CHOL, HDL, LDLCALC, TRIG, CHOLHDL, LDLDIRECT in the last 72 hours. Thyroid Function Tests: No results for input(s): TSH, T4TOTAL, FREET4, T3FREE, THYROIDAB in the last 72 hours. Anemia Panel: No results for input(s): VITAMINB12, FOLATE, FERRITIN, TIBC, IRON, RETICCTPCT in the last 72 hours. Sepsis Labs: Recent Labs  Lab 01/15/19 1202 01/15/19 1408 01/15/19 1733 01/15/19 2052 01/16/19 0216  PROCALCITON  --   --   --   --  63.62  LATICACIDVEN 4.3* 2.5* 3.1* 1.8  --     Recent Results (from the past 240 hour(s))  Culture, blood (Routine x 2)     Status: Abnormal   Collection Time: 01/15/19 12:00 PM   Specimen: BLOOD   Result Value Ref Range Status   Specimen Description   Final    BLOOD LEFT ANTECUBITAL Performed at [facility], [address], [place], [place] [address]    Special Requests   Final    BOTTLES DRAWN AEROBIC AND ANAEROBIC Blood Culture adequate volume Performed at [facility], [address] [place], [place] [address]    Culture  Setup Time   Final    IN BOTH AEROBIC AND ANAEROBIC BOTTLES GRAM VARIABLE ROD CRITICAL RESULT CALLED TO, READ BACK BY AND VERIFIED WITH: B GREEN PHARMD 01/16/19 0308 JDW    Culture (A)  Final    PROTEUS MIRABILIS SUSCEPTIBILITIES PERFORMED ON PREVIOUS CULTURE WITHIN THE LAST 5 DAYS. Performed at [facility], [address] [place], [place] [address]    Report Status 01/18/2019 FINAL  Final  Blood Culture ID Panel (Reflexed)     Status: Abnormal   Collection Time: 01/15/19 12:00 PM  Result Value Ref Range Status   Enterococcus species NOT DETECTED NOT DETECTED Final   Listeria monocytogenes NOT DETECTED NOT DETECTED Final   Staphylococcus species NOT DETECTED NOT DETECTED Final   Staphylococcus aureus (BCID) NOT DETECTED NOT DETECTED Final   Streptococcus species NOT DETECTED NOT DETECTED Final   Streptococcus agalactiae NOT DETECTED NOT DETECTED Final   Streptococcus pneumoniae NOT DETECTED NOT DETECTED Final   Streptococcus pyogenes NOT DETECTED NOT DETECTED Final   Acinetobacter baumannii NOT DETECTED NOT DETECTED Final   Enterobacteriaceae species DETECTED (A) NOT DETECTED Final    Comment: Enterobacteriaceae represent a large family of gram-negative bacteria, not a single organism. CRITICAL RESULT CALLED TO, READ BACK BY AND VERIFIED WITH: B GREEN PHARMD 01/16/19 0308 JDW    Enterobacter cloacae complex NOT DETECTED NOT DETECTED Final   Escherichia coli NOT DETECTED NOT DETECTED Final   Klebsiella oxytoca NOT DETECTED NOT DETECTED Final   Klebsiella pneumoniae NOT DETECTED NOT DETECTED Final    Proteus species DETECTED (A) NOT DETECTED Final    Comment: CRITICAL RESULT CALLED TO, READ BACK BY AND VERIFIED WITH: B GREEN PHARMD 01/16/19 0308 JDW    Serratia marcescens NOT DETECTED NOT DETECTED Final   Carbapenem resistance NOT DETECTED NOT DETECTED Final   Haemophilus influenzae NOT DETECTED NOT DETECTED Final   Neisseria meningitidis NOT DETECTED NOT DETECTED Final   Pseudomonas aeruginosa NOT DETECTED NOT DETECTED Final   Candida albicans NOT DETECTED NOT DETECTED Final   Candida  glabrata NOT DETECTED NOT DETECTED Final   Candida krusei NOT DETECTED NOT DETECTED Final   Candida parapsilosis NOT DETECTED NOT DETECTED Final   Candida tropicalis NOT DETECTED NOT DETECTED Final    Comment: Performed at [facility], [address] [place], [place] [address]  Culture, blood (Routine x 2)     Status: Abnormal   Collection Time: 01/15/19 12:02 PM   Specimen: BLOOD RIGHT FOREARM  Result Value Ref Range Status   Specimen Description   Final    BLOOD RIGHT FOREARM Performed at [facility], [address], [place], [place] [address]    Special Requests   Final    BOTTLES DRAWN AEROBIC AND ANAEROBIC Blood Culture results may not be optimal due to an excessive volume of blood received in culture bottles Performed at [facility], [address] [place], [place] [address]    Culture  Setup Time   Final    IN BOTH AEROBIC AND ANAEROBIC BOTTLES GRAM VARIABLE ROD CRITICAL VALUE NOTED.  VALUE IS CONSISTENT WITH PREVIOUSLY REPORTED AND CALLED VALUE. Performed at [facility], [address] [place], [place] [address]    Culture PROTEUS MIRABILIS (A)  Final   Report Status 01/18/2019 FINAL  Final   Organism ID, Bacteria PROTEUS MIRABILIS  Final      Susceptibility   Proteus mirabilis - MIC*    AMPICILLIN <=2 SENSITIVE Sensitive     CEFAZOLIN <=4 SENSITIVE Sensitive     CEFEPIME <=1 SENSITIVE Sensitive     CEFTAZIDIME <=1 SENSITIVE  Sensitive     CEFTRIAXONE <=1 SENSITIVE Sensitive     CIPROFLOXACIN <=0.25 SENSITIVE Sensitive     GENTAMICIN <=1 SENSITIVE Sensitive     IMIPENEM 2 SENSITIVE Sensitive     TRIMETH/SULFA <=20 SENSITIVE Sensitive     AMPICILLIN/SULBACTAM <=2 SENSITIVE Sensitive     PIP/TAZO <=4 SENSITIVE Sensitive     * PROTEUS MIRABILIS  SARS CORONAVIRUS 2 (TAT 6-24 HRS) Nasopharyngeal Nasopharyngeal Swab     Status: None   Collection Time: 01/15/19 12:31 PM   Specimen: Nasopharyngeal Swab  Result Value Ref Range Status   SARS Coronavirus 2 NEGATIVE NEGATIVE Final    Comment: (NOTE) SARS-CoV-2 target nucleic acids are NOT DETECTED. The SARS-CoV-2 RNA is generally detectable in upper and lower respiratory specimens during the acute phase of infection. Negative results do not preclude SARS-CoV-2 infection, do not rule out co-infections with other pathogens, and should not be used as the sole basis for treatment or other patient management decisions. Negative results must be combined with clinical observations, patient history, and epidemiological information. The expected result is Negative. Fact Sheet for Patients: HairSlick.no Fact Sheet for Healthcare Providers: quierodirigir.com This test is not yet approved or cleared by the [place] FDA and  has been authorized for detection and/or diagnosis of SARS-CoV-2 by FDA under an Emergency Use Authorization (EUA). This EUA will remain  in effect (meaning this test can be used) for the duration of the COVID-19 declaration under Section 56 4(b)(1) of the Act, 21 U.S.C. section 360bbb-3(b)(1), unless the authorization is terminated or revoked sooner. Performed at [facility], [address] [place], [place] [address]   Culture, blood (routine x 2)     Status: None (Preliminary result)   Collection Time: 01/17/19  9:04 AM   Specimen: BLOOD RIGHT HAND  Result Value Ref Range Status    Specimen Description   Final    BLOOD RIGHT HAND  Performed at [facility], [place] [address], [place] [employer], [place] [address]    Special Requests   Final    BOTTLES DRAWN AEROBIC AND ANAEROBIC Blood Culture adequate volume Performed at [place] [address] [place], [place] [address]    Culture   Final    NO GROWTH 1 DAY Performed at [facility], [place] [address] [place], [place] [address]    Report Status PENDING  Incomplete  Culture, blood (routine x 2)     Status: None (Preliminary result)   Collection Time: 01/17/19  9:08 AM   Specimen: BLOOD LEFT HAND  Result Value Ref Range Status   Specimen Description   Final    BLOOD LEFT HAND Performed at [place] [address] [place], [place] [address]    Special Requests   Final    BOTTLES DRAWN AEROBIC ONLY Blood Culture adequate volume Performed at [place] [address], [place], [place] [address]    Culture   Final    NO GROWTH 1 DAY Performed at [facility], [place] [address] [place], [place] [address]    Report Status PENDING  Incomplete  Urine culture     Status: None   Collection Time: 01/17/19  6:56 PM   Specimen: In/Out Cath Urine  Result Value Ref Range Status   Specimen Description   Final    IN/OUT CATH URINE Performed at [place] [address] [place], [place] [address]    Special Requests   Final    NONE Performed at [facility], [place] [address] [place], [place] [address]    Culture   Final    NO GROWTH Performed at [facility], [place] [address] [place], [place] [address]    Report Status 01/18/2019 FINAL  Final         Radiology Studies: No results found.      Scheduled Meds: . carbidopa-levodopa  1.5 tablet Oral 5 X Daily  . Chlorhexidine Gluconate Cloth  6 each Topical Daily  . enoxaparin (LOVENOX) injection  40 mg Subcutaneous Q24H  .  gabapentin  100 mg Oral TID  . mouth rinse  15 mL Mouth Rinse BID  . mirabegron ER  50 mg Oral Daily  . polyethylene glycol  17 g Oral Daily   Continuous Infusions: .  ceFAZolin (ANCEF) IV 2 g (01/19/19 0957)     LOS: 4 days    Time spent: 30 minutes with over 50% of the time coordinating the patient's care    Harold Hedge, DO [facility] Pager [phone]  If 7PM-7AM, please contact night-coverage [email or web address] Password [facility] 01/19/2019, 10:24 AM

## 2019-01-19 NOTE — Telephone Encounter (Signed)
Opened in error

## 2019-01-19 NOTE — Progress Notes (Signed)
SLP Cancellation Note  Patient Details Name: Mark Mathews MRN: [id number] DOB: 1940/11/12   Cancelled treatment:        Pt was not alert this am when SlP attempted treatment.  RN reports pt is tolerating po adequately.   Luanna Salk, MS [facility] SLP Acute Rehab Services Pager [phone] Office [phone]    Macario Golds 01/19/2019, 5:56 PM

## 2019-01-19 NOTE — Progress Notes (Signed)
Subjective: Patient reports nausea and vomitng after eating lunch. He made 2.6L of urine yesterday. He was placed on mirabegron yesterday. He continues to have intermittent incontinence which has improved  Objective: Vital signs in last 24 hours: Temp:  [97.5 F (36.4 C)-98.6 F (37 C)] 98.3 F (36.8 C) (11/17 1401) Pulse Rate:  [58-68] 68 (11/17 1401) Resp:  [16-18] 18 (11/17 1401) BP: (122-130)/(73-75) 122/75 (11/17 1401) SpO2:  [97 %-98 %] 97 % (11/17 1401)  Intake/Output from previous day: 11/16 0701 - 11/17 0700 In: 720 [P.O.:720] Out: 2625 [Urine:2625] Intake/Output this shift: Total I/O In: 600 [P.O.:600] Out: 1100 [Urine:1100]  Physical Exam:  General:alert, cooperative and appears older than stated age GI: soft, non tender, normal bowel sounds, no palpable masses, no organomegaly, no inguinal hernia Male genitalia: not done Extremities: extremities normal, atraumatic, no cyanosis or edema  Lab Results: Recent Labs    01/17/19 0230 01/18/19 0644 01/19/19 0716  HGB 9.6* 10.7* 10.9*  HCT 30.5* 34.0* 34.7*   BMET Recent Labs    01/18/19 0644 01/19/19 0716  NA 141 137  K 4.0 3.7  CL 111 105  CO2 23 25  GLUCOSE 89 103*  BUN 15 12  CREATININE 0.75 0.77  CALCIUM 8.4* 8.5*   No results for input(s): LABPT, INR in the last 72 hours. No results for input(s): LABURIN in the last 72 hours. Results for orders placed or performed during the hospital encounter of 01/15/19  Culture, blood (Routine x 2)     Status: Abnormal   Collection Time: 01/15/19 12:00 PM   Specimen: BLOOD  Result Value Ref Range Status   Specimen Description   Final    BLOOD LEFT ANTECUBITAL Performed at [facility], [address] [place], [place] [address]    Special Requests   Final    BOTTLES DRAWN AEROBIC AND ANAEROBIC Blood Culture adequate volume Performed at [facility], [address], [place], [place] [address]    Culture  Setup Time    Final    IN BOTH AEROBIC AND ANAEROBIC BOTTLES GRAM VARIABLE ROD CRITICAL RESULT CALLED TO, READ BACK BY AND VERIFIED WITH: B GREEN PHARMD 01/16/19 0308 JDW    Culture (A)  Final    PROTEUS MIRABILIS SUSCEPTIBILITIES PERFORMED ON PREVIOUS CULTURE WITHIN THE LAST 5 DAYS. Performed at [facility], [address] [place], [place] [address]    Report Status 01/18/2019 FINAL  Final  Blood Culture ID Panel (Reflexed)     Status: Abnormal   Collection Time: 01/15/19 12:00 PM  Result Value Ref Range Status   Enterococcus species NOT DETECTED NOT DETECTED Final   Listeria monocytogenes NOT DETECTED NOT DETECTED Final   Staphylococcus species NOT DETECTED NOT DETECTED Final   Staphylococcus aureus (BCID) NOT DETECTED NOT DETECTED Final   Streptococcus species NOT DETECTED NOT DETECTED Final   Streptococcus agalactiae NOT DETECTED NOT DETECTED Final   Streptococcus pneumoniae NOT DETECTED NOT DETECTED Final   Streptococcus pyogenes NOT DETECTED NOT DETECTED Final   Acinetobacter baumannii NOT DETECTED NOT DETECTED Final   Enterobacteriaceae species DETECTED (A) NOT DETECTED Final    Comment: Enterobacteriaceae represent a large family of gram-negative bacteria, not a single organism. CRITICAL RESULT CALLED TO, READ BACK BY AND VERIFIED WITH: B GREEN PHARMD 01/16/19 0308 JDW    Enterobacter cloacae complex NOT DETECTED NOT DETECTED Final   Escherichia coli NOT DETECTED NOT DETECTED Final   Klebsiella oxytoca NOT DETECTED NOT DETECTED Final   Klebsiella  pneumoniae NOT DETECTED NOT DETECTED Final   Proteus species DETECTED (A) NOT DETECTED Final    Comment: CRITICAL RESULT CALLED TO, READ BACK BY AND VERIFIED WITH: B GREEN PHARMD 01/16/19 0308 JDW    Serratia marcescens NOT DETECTED NOT DETECTED Final   Carbapenem resistance NOT DETECTED NOT DETECTED Final   Haemophilus influenzae NOT DETECTED NOT DETECTED Final   Neisseria meningitidis NOT DETECTED NOT DETECTED Final   Pseudomonas  aeruginosa NOT DETECTED NOT DETECTED Final   Candida albicans NOT DETECTED NOT DETECTED Final   Candida glabrata NOT DETECTED NOT DETECTED Final   Candida krusei NOT DETECTED NOT DETECTED Final   Candida parapsilosis NOT DETECTED NOT DETECTED Final   Candida tropicalis NOT DETECTED NOT DETECTED Final    Comment: Performed at [facility], [address] [place], [place] [address]  Culture, blood (Routine x 2)     Status: Abnormal   Collection Time: 01/15/19 12:02 PM   Specimen: BLOOD RIGHT FOREARM  Result Value Ref Range Status   Specimen Description   Final    BLOOD RIGHT FOREARM Performed at [facility], [address] [place], [place] [address]    Special Requests   Final    BOTTLES DRAWN AEROBIC AND ANAEROBIC Blood Culture results may not be optimal due to an excessive volume of blood received in culture bottles Performed at [facility], [address] [place], [place] [address]    Culture  Setup Time   Final    IN BOTH AEROBIC AND ANAEROBIC BOTTLES GRAM VARIABLE ROD CRITICAL VALUE NOTED.  VALUE IS CONSISTENT WITH PREVIOUSLY REPORTED AND CALLED VALUE. Performed at [facility], [address] [place], [place] [address]    Culture PROTEUS MIRABILIS (A)  Final   Report Status 01/18/2019 FINAL  Final   Organism ID, Bacteria PROTEUS MIRABILIS  Final      Susceptibility   Proteus mirabilis - MIC*    AMPICILLIN <=2 SENSITIVE Sensitive     CEFAZOLIN <=4 SENSITIVE Sensitive     CEFEPIME <=1 SENSITIVE Sensitive     CEFTAZIDIME <=1 SENSITIVE Sensitive     CEFTRIAXONE <=1 SENSITIVE Sensitive     CIPROFLOXACIN <=0.25 SENSITIVE Sensitive     GENTAMICIN <=1 SENSITIVE Sensitive     IMIPENEM 2 SENSITIVE Sensitive     TRIMETH/SULFA <=20 SENSITIVE Sensitive     AMPICILLIN/SULBACTAM <=2 SENSITIVE Sensitive     PIP/TAZO <=4 SENSITIVE Sensitive     * PROTEUS MIRABILIS  SARS CORONAVIRUS 2 (TAT 6-24 HRS) Nasopharyngeal Nasopharyngeal Swab      Status: None   Collection Time: 01/15/19 12:31 PM   Specimen: Nasopharyngeal Swab  Result Value Ref Range Status   SARS Coronavirus 2 NEGATIVE NEGATIVE Final    Comment: (NOTE) SARS-CoV-2 target nucleic acids are NOT DETECTED. The SARS-CoV-2 RNA is generally detectable in upper and lower respiratory specimens during the acute phase of infection. Negative results do not preclude SARS-CoV-2 infection, do not rule out co-infections with other pathogens, and should not be used as the sole basis for treatment or other patient management decisions. Negative results must be combined with clinical observations, patient history, and epidemiological information. The expected result is Negative. Fact Sheet for Patients: HairSlick.nohttps://www.fda.gov/media/138098/download Fact Sheet for Healthcare Providers: quierodirigir.comhttps://www.fda.gov/media/138095/download This test is not yet approved or cleared by the [place] FDA and  has been authorized for detection and/or diagnosis of SARS-CoV-2 by FDA under an Emergency Use Authorization (EUA). This EUA will remain  in effect (  meaning this test can be used) for the duration of the COVID-19 declaration under Section 56 4(b)(1) of the Act, 21 U.S.C. section 360bbb-3(b)(1), unless the authorization is terminated or revoked sooner. Performed at [facility], [place] [address], [place], [place] [address]   Culture, blood (routine x 2)     Status: None (Preliminary result)   Collection Time: 01/17/19  9:04 AM   Specimen: BLOOD RIGHT HAND  Result Value Ref Range Status   Specimen Description   Final    BLOOD RIGHT HAND Performed at [place] [address], [place], [place] [address]    Special Requests   Final    BOTTLES DRAWN AEROBIC AND ANAEROBIC Blood Culture adequate volume Performed at [place] [address], [place], [place] [address]    Culture   Final    NO GROWTH 2 DAYS Performed at [facility] [place] [address] [place], [place] [address]    Report Status PENDING  Incomplete  Culture, blood (routine x 2)     Status: None (Preliminary result)   Collection Time: 01/17/19  9:08 AM   Specimen: BLOOD LEFT HAND  Result Value Ref Range Status   Specimen Description   Final    BLOOD LEFT HAND Performed at [facility] [address], [place], [place] [address]    Special Requests   Final    BOTTLES DRAWN AEROBIC ONLY Blood Culture adequate volume Performed at [facility] [address] [place], [place] [address]    Culture   Final    NO GROWTH 2 DAYS Performed at [facility] [address] [place], [place] [address]    Report Status PENDING  Incomplete  Urine culture     Status: None   Collection Time: 01/17/19  6:56 PM   Specimen: In/Out Cath Urine  Result Value Ref Range Status   Specimen Description   Final    IN/OUT CATH URINE Performed at [facility] [address], [place], [place] [address]    Special Requests   Final    NONE Performed at [facility], [place] [address], [place], [place] [address]    Culture   Final    NO GROWTH Performed at [facility], [place] [address] [place], [place] [address]    Report Status 01/18/2019 FINAL  Final    Studies/Results: No results found.  Assessment/Plan: 77yo with bilateral hydroneophrosis and urinary incontinence  1. Bilateral hydronephrosis: The patient continues to have good urine output and a normal creatinine. The patient does not require bilateral nephrostomy tubes at this time.   Urinary incontinence: continue mirabegron 50mg  since this has improved his incontinence   LOS: 4 days   Nicolette Bang 01/19/2019, 6:48 PM

## 2019-01-20 LAB — CBC
HCT: 34.6 % — ABNORMAL LOW (ref 39.0–52.0)
Hemoglobin: 11.2 g/dL — ABNORMAL LOW (ref 13.0–17.0)
MCH: 31.6 pg (ref 26.0–34.0)
MCHC: 32.4 g/dL (ref 30.0–36.0)
MCV: 97.7 fL (ref 80.0–100.0)
Platelets: 265 10*3/uL (ref 150–400)
RBC: 3.54 MIL/uL — ABNORMAL LOW (ref 4.22–5.81)
RDW: 12.3 % (ref 11.5–15.5)
WBC: 9 10*3/uL (ref 4.0–10.5)
nRBC: 0 % (ref 0.0–0.2)

## 2019-01-20 LAB — COMPREHENSIVE METABOLIC PANEL
ALT: 16 U/L (ref 0–44)
AST: 31 U/L (ref 15–41)
Albumin: 2.7 g/dL — ABNORMAL LOW (ref 3.5–5.0)
Alkaline Phosphatase: 63 U/L (ref 38–126)
Anion gap: 8 (ref 5–15)
BUN: 14 mg/dL (ref 8–23)
CO2: 25 mmol/L (ref 22–32)
Calcium: 8.4 mg/dL — ABNORMAL LOW (ref 8.9–10.3)
Chloride: 104 mmol/L (ref 98–111)
Creatinine, Ser: 0.86 mg/dL (ref 0.61–1.24)
GFR calc Af Amer: >60 mL/min (ref >60)
GFR calc non Af Amer: >60 mL/min (ref >60)
Glucose, Bld: 98 mg/dL (ref 70–99)
Potassium: 3.9 mmol/L (ref 3.5–5.1)
Sodium: 137 mmol/L (ref 135–145)
Total Bilirubin: 0.2 mg/dL — ABNORMAL LOW (ref 0.3–1.2)
Total Protein: 6.5 g/dL (ref 6.5–8.1)

## 2019-01-20 MED ORDER — MAGNESIUM SULFATE 2 GM/50ML IV SOLN
2.0000 g | Freq: Once | INTRAVENOUS | Status: AC
  Administered 2019-01-20: 10:00:00 2 g via INTRAVENOUS
  Filled 2019-01-20: qty 50

## 2019-01-20 MED ORDER — ADULT MULTIVITAMIN W/MINERALS CH
1.0000 | ORAL_TABLET | Freq: Every day | ORAL | Status: DC
  Administered 2019-01-20 – 2019-01-22 (×3): 1 via ORAL
  Filled 2019-01-20 (×3): qty 1

## 2019-01-20 MED ORDER — SENNOSIDES-DOCUSATE SODIUM 8.6-50 MG PO TABS
1.0000 | ORAL_TABLET | Freq: Two times a day (BID) | ORAL | Status: DC
  Administered 2019-01-20 – 2019-01-22 (×6): 1 via ORAL
  Filled 2019-01-20 (×6): qty 1

## 2019-01-20 MED ORDER — POLYETHYLENE GLYCOL 3350 17 G PO PACK
17.0000 g | PACK | Freq: Two times a day (BID) | ORAL | Status: DC
  Administered 2019-01-20 – 2019-01-22 (×5): 17 g via ORAL
  Filled 2019-01-20 (×5): qty 1

## 2019-01-20 MED ORDER — PRO-STAT SUGAR FREE PO LIQD
30.0000 mL | Freq: Two times a day (BID) | ORAL | Status: DC
  Administered 2019-01-20 – 2019-01-22 (×5): 30 mL via ORAL
  Filled 2019-01-20 (×5): qty 30

## 2019-01-20 MED ORDER — ENSURE ENLIVE PO LIQD
237.0000 mL | Freq: Two times a day (BID) | ORAL | Status: DC
  Administered 2019-01-20 – 2019-01-22 (×5): 237 mL via ORAL

## 2019-01-20 NOTE — Progress Notes (Signed)
Patient's son called and asked if patient's DNR was on the chart, which it is not.  Son requested that we provide another DNR before patient is discharged.

## 2019-01-20 NOTE — Progress Notes (Signed)
Initial Nutrition Assessment  INTERVENTION:   -Ensure Enlive po BID, each supplement provides 350 kcal and 20 grams of protein -Magic cup BID with meals, each supplement provides 290 kcal and 9 grams of protein -Prostat liquid protein PO 30 ml BID with meals, each supplement provides 100 kcal, 15 grams protein. -Multivitamin with minerals daily  NUTRITION DIAGNOSIS:   Increased nutrient needs related to wound healing as evidenced by estimated needs.  GOAL:   Patient will meet greater than or equal to 90% of their needs  MONITOR:   Supplement acceptance, PO intake, Labs, Weight trends, Skin, I & O's  REASON FOR ASSESSMENT:   (Wound)    ASSESSMENT:   78 year old male with past medical history of Parkinson's, bedbound, chronic indwelling catheter with traumatic hypospadias and recurrent UTI with recent antibiotic use with noted loose stools, sacral decubitus ulcer who presented to the ED 11/13 with altered mental status x1 day.  Admitted for severe sepsis.  Patient currently consuming 25% of meals today. Pt was consuming 75-100% of meals yesterday 11/17 but reports an episode of N/V following lunch.  Per SLP note 11/18, pt's diet can be advanced to dysphagia 3 given no indication of aspiration or dysphagia currently. Pt ate a sandwich with SLP today during evaluation.  RD will order Ensure, Prostat and Magic Cup supplements as he has enjoyed these in the past.   Per weight records, pt has lost 24 lbs since 2/3 (15% wt loss x 9.5 months, significant for time frame).  I/Os: -4.4L since admit UOP: 4050 ml x 24 hrs  Labs reviewed. Medications: IV Mg sulfate   NUTRITION - FOCUSED PHYSICAL EXAM:  Deferred.  Diet Order:   Diet Order            DIET DYS 3 Room service appropriate? Yes with Assist; Fluid consistency: Thin  Diet effective now              EDUCATION NEEDS:   No education needs have been identified at this time  Skin:  Skin Assessment: Skin Integrity  Issues: Skin Integrity Issues:: Stage II Stage II: Per WOC note, sacrum/coccyx  Last BM:  11/17 -type 1  Height:   Ht Readings from Last 1 Encounters:  01/15/19 5\' 8"  (1.727 m)    Weight:   Wt Readings from Last 1 Encounters:  01/15/19 61.2 kg    Ideal Body Weight:  70 kg  BMI:  Body mass index is 20.53 kg/m.  Estimated Nutritional Needs:   Kcal:  1650-1850  Protein:  80-90g  Fluid:  1.8L/day  Clayton Bibles, MS, RD, LDN Inpatient Clinical Dietitian Pager: [phone] After Hours Pager: [phone]

## 2019-01-20 NOTE — Progress Notes (Signed)
PROGRESS NOTE    TAYLON COOLE  ZOX:[id number] DOB: February 06, 1941 DOA: 01/15/2019 PCP: Maurice Small, MD  Brief Narrative:  This is a 78 year old male with past medical history of Parkinson's, bedbound, chronic indwelling catheter with traumatic hypospadias and recurrent UTI with recent antibiotic use with noted loose stools, sacral decubitus ulcer who presented to the ED 11/13 with altered mental status x1 day.  Patient was admitted to stepdown unit for severe sepsis with tachycardia, tachypnea, lactic acidosis 4.3 ->>>1.8, altered mental status, WBC 30.  Initially with concern for urinary source versus GI source.  Started on broad-spectrum antibiotics with cefepime/vancomycin/Flagyl as well as IV fluid resuscitation. CT abdomen pelvis in ED showed new onset bilateral hydroureteronephrosis down to bladder as well as stercoral colitis and large volume fecal impaction.  Cultures positive for Proteus and antibiotics changed to ceftriaxone monotherapy.  Urology was consulted who recommended bilateral PCN if patient is viable long-term and did not recommend Foley catheter change.  Manual disimpaction was attempted by nursing but unsuccessful on 11/14.  He was started on MiraLAX.  Later in the day 11/14 patient noted to have persistently low BP in 80s/30s and given IV fluid bolus.  Transfer to Tele-med floor 11/15.  He was given to smog enemas along with Fleet enema and nursing states that he is improving his bowel regimen.  We will continue aggressive bowel management and continue antibiotics at this time for Proteus bacteremia  Assessment & Plan:   Principal Problem:   Severe sepsis (HCC) Active Problems:   Pressure injury of skin   Dementia with behavioral disturbance (HCC)   Bacteremia  Acute metabolic encephalopathy secondary to Proteus bacteremia and severe sepsis, likely urinary source  -on admission: febrile, tachycardic, tachypneic, altered mental status, lactic acid 4.3., Covid  negative.  -Day 6 antibiotics: Initially received vancomycin/cefepime in ED, broadened to cefepime/vancomycin/Flagyl and changed to ceftriaxone monotherapy with resulted blood cultures, deescalated to ancef 11/16.  -Awake today but was more somnolent yesterday, started on gabapentin the day before yesterday and now with Mg 1.7 -Continue antibiotics -Discontinue gabapentin -Replete Magnesium -WBC is improved -Remains somewhat confused and agitated  General Malaise  -Gabapentin vs. Persistent bacteremia (afebrile, no leukocytosis, blood cultures from 11/15 negative) vs. Debility vs. Other -Discontinue gabapentin -repeat blood cultures show no growth to date less than 24 hours -We will consider palliative care consult if not improving  Bilateral hydronephrosis -Urology consulted: recommended bilateral PCN if patient is viable long-term which would allow for catheter removal. did not recommend Foley catheter change.  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  -Continue bowel regimen -Consider contacting GI in a.m. if no stools but nursing states that he had several good bowel movement after smog enema  Recurrent UTI in setting of chronic indwelling catheter -completed course of Keflex at the end of September and course of Macrobid at the end of October. Proteus mirabilis UTI 11/26/2018 -Plan as above  AKI secondary to severe sepsis -resolved with fluids -BUN/creatinine of 14/0.86  Parkinson'scontinue Sinemet IR to prevent withdrawal.  -seen by SLP and they are recommending a dysphagia 3 diet -Hold  remaining home meds  Bilateral lower extremity paresthesias -Resolved with gabapentin the day before yesterday.  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 -Discontinued gabapentin with new somnolence today as above  Nongap metabolic acidosissecondary to sepsis, AKI and hyperchloremia  -Resolved -Patient's CO2 is now 25, chloride is 104, and anion gap is 8  DVT prophylaxis: Enoxaparin 40 mg sq q24h Code Status: DO NOT RESUSCITATE  Family Communication: No family present at bedside  Disposition Plan: Pending Clinical Improvement but likely SNF  Consultants:   Urology   Procedures:  None   Antimicrobials:  Anti-infectives (From admission, onward)   Start     Dose/Rate Route Frequency Ordered Stop   01/19/19 0900  ceFAZolin (ANCEF) IVPB 2g/100 mL premix     2 g 200 mL/hr over 30 Minutes Intravenous Every 8 hours 01/18/19 1137     01/18/19 1400  ceFAZolin (ANCEF) IVPB 2g/100 mL premix  Status:  Discontinued     2 g 200 mL/hr over 30 Minutes Intravenous Every 8 hours 01/18/19 1123 01/18/19 1137   01/16/19 1400  vancomycin (VANCOCIN) IVPB 750 mg/150 ml premix  Status:  Discontinued     750 mg 150 mL/hr over 60 Minutes Intravenous Every 24 hours 01/15/19 1745 01/16/19 0327   01/16/19 0500  cefTRIAXone (ROCEPHIN) 2 g in sodium chloride 0.9 % 100 mL IVPB  Status:  Discontinued     2 g 200 mL/hr over 30 Minutes Intravenous Daily 01/16/19 0326 01/18/19 1123   01/15/19 2300  ceFEPIme (MAXIPIME) 2 g in sodium chloride 0.9 % 100 mL IVPB  Status:  Discontinued     2 g 200 mL/hr over 30 Minutes Intravenous Every 12 hours 01/15/19 1745 01/16/19 0327   01/15/19 2000  metroNIDAZOLE (FLAGYL) IVPB 500 mg  Status:  Discontinued     500 mg 100 mL/hr over 60 Minutes Intravenous Every 8 hours 01/15/19 1916 01/17/19 1329   01/15/19 1930  ceFEPIme (MAXIPIME) 2 g in sodium chloride 0.9 % 100 mL IVPB  Status:  Discontinued     2 g 200 mL/hr over 30 Minutes Intravenous  Once 01/15/19 1916  01/15/19 1921   01/15/19 1930  vancomycin (VANCOCIN) IVPB 1000 mg/200 mL premix  Status:  Discontinued     1,000 mg 200 mL/hr over 60 Minutes Intravenous  Once 01/15/19 1916 01/15/19 1921   01/15/19 1315  vancomycin (VANCOCIN) IVPB 1000 mg/200 mL premix     1,000 mg 200 mL/hr over 60 Minutes Intravenous  Once 01/15/19 1314 01/15/19 1505   01/15/19 1230  ceFEPIme (MAXIPIME) 2 g in sodium chloride 0.9 % 100 mL IVPB     2 g 200 mL/hr over 30 Minutes Intravenous  Once 01/15/19 1228 01/15/19 1348     Subjective: Seen and examined at bedside was still confused and agitated and wanting to go home.  Denied any complaints and states that he will refuse enema every some time now.  No nausea or vomiting.  No other concerns or complaints at this time.  Objective: Vitals:   01/19/19 1401 01/19/19 2027 01/20/19 0502 01/20/19 1346  BP: 122/75 114/81 124/70 118/66  Pulse: 68 72 79 79  Resp: Temp: 98.3 F (36.8 C) 98.2 F (36.8 C) 98.1 F (36.7 C) 98.7 F (37.1 C)  TempSrc: Oral Oral Oral Oral  SpO2: 97% 96% 100% 97%  Weight:      Height:        Intake/Output Summary (Last 24 hours) at 01/20/2019 1924 Last data filed at 01/20/2019 1815 Gross per 24 hour  Intake 1360 ml  Output 3750 ml  Net -2390 ml   Filed Weights   01/15/19 1205  Weight: 61.2 kg   Examination: Physical Exam:  Constitutional: Thin elderly Caucasian male who is bedbound currently agitated  Eyes: Lds and conjunctivae normal, sclerae anicteric  ENMT: External Ears, Nose appear normal. Grossly normal hearing. Neck: Appears normal, supple, no cervical masses, normal ROM, no appreciable thyromegaly  Respiratory: Diminished to auscultation bilaterally, no wheezing, rales, rhonchi or crackles. Normal respiratory effort and patient is not tachypenic. No accessory muscle use.  Unlabored breathing Cardiovascular: RRR, no murmurs / rubs / gallops. S1 and S2 auscultated. No extremity edema. 2+ pedal pulses. No  carotid bruits.  Abdomen: Soft, non-tender, non-distended. Bowel sounds positive.  GU: Deferred.  Has an indwelling catheter in place Musculoskeletal: Legs are atrophied with some contractures Skin: No rashes, lesions, ulcers or limited skin evaluation but does have a sacral decubitus ulcer. No induration; Warm and dry.  Neurologic: CN 2-12 grossly intact with no focal deficits. Romberg sign and cerebellar reflexes not assessed.  Psychiatric: Impaired judgment and insight. Alert and oriented x 3.  Anxious mood and appropriate affect.   Data Reviewed: I have personally reviewed following labs and imaging studies  CBC: Recent Labs  Lab 01/15/19 1208  01/16/19 0216 01/17/19 0230 01/18/19 0644 01/19/19 0716 01/20/19 0536  WBC 5.8   < > 28.8* 18.9* 11.4* 9.6 9.0  NEUTROABS 5.4  --   --   --   --   --   --   HGB 11.9*   < > 10.6* 9.6* 10.7* 10.9* 11.2*  HCT 37.6*   < > 33.5* 30.5* 34.0* 34.7* 34.6*  MCV 100.0   < > 100.0 101.7* 101.5* 99.7 97.7  PLT 269   < > 231 183 215 226 265   < > = values in this interval not displayed.   Basic Metabolic Panel: Recent Labs  Lab 01/16/19 0216 01/17/19 0230 01/18/19 0644 01/19/19 0716 01/20/19 0536  NA 140 139 141 137 137  K 3.6 3.1* 4.0 3.7 3.9  CL 112* 112* 111 105 104  CO2 19* 21* 23 25 25   GLUCOSE 90 89 89 103* 98  BUN 34* 24* 15 12 14   CREATININE 1.11 0.66 0.75 0.77 0.86  CALCIUM 8.0* 7.9* 8.4* 8.5* 8.4*  MG  --  2.0  --  1.7  --    GFR: Estimated Creatinine Clearance: 61.3 mL/min (by C-G formula based on SCr of 0.86 mg/dL). Liver Function Tests: Recent Labs  Lab 01/15/19 1208 01/16/19 0216 01/20/19 0536  AST 45* 88* 31  ALT 12 61* 16  ALKPHOS 55 56 63  BILITOT 0.9 0.7 0.2*  PROT 7.0 6.2* 6.5  ALBUMIN 3.2* 2.7* 2.7*   No results for input(s): LIPASE, AMYLASE in the last 168 hours. No results for input(s): AMMONIA in the last 168 hours. Coagulation Profile: Recent Labs  Lab 01/15/19 1208 01/16/19 0216  INR 1.2 1.4*    Cardiac Enzymes: No results for input(s): CKTOTAL, CKMB, CKMBINDEX, TROPONINI in the last 168 hours. BNP (last 3 results) No results for input(s): PROBNP in the last 8760 hours. HbA1C: No results for input(s): HGBA1C in the last 72 hours. CBG: No results for input(s): GLUCAP in the last 168 hours. Lipid Profile: No results for input(s): CHOL, HDL, LDLCALC, TRIG, CHOLHDL, LDLDIRECT in the last 72 hours. Thyroid Function Tests: No results for input(s): TSH, T4TOTAL, FREET4, T3FREE, THYROIDAB in the last 72 hours. Anemia Panel: No results for input(s): VITAMINB12, FOLATE, FERRITIN, TIBC, IRON, RETICCTPCT in  the last 72 hours. Sepsis Labs: Recent Labs  Lab 01/15/19 1202 01/15/19 1408 01/15/19 1733 01/15/19 2052 01/16/19 0216  PROCALCITON  --   --   --   --  63.62  LATICACIDVEN 4.3* 2.5* 3.1* 1.8  --     Recent Results (from the past 240 hour(s))  Culture, blood (Routine x 2)     Status: Abnormal   Collection Time: 01/15/19 12:00 PM   Specimen: BLOOD  Result Value Ref Range Status   Specimen Description   Final    BLOOD LEFT ANTECUBITAL Performed at [facility], [address] [place], [place] [address]    Special Requests   Final    BOTTLES DRAWN AEROBIC AND ANAEROBIC Blood Culture adequate volume Performed at [facility], [address] [place], [place] [address]    Culture  Setup Time   Final    IN BOTH AEROBIC AND ANAEROBIC BOTTLES GRAM VARIABLE ROD CRITICAL RESULT CALLED TO, READ BACK BY AND VERIFIED WITH: B GREEN PHARMD 01/16/19 0308 JDW    Culture (A)  Final    PROTEUS MIRABILIS SUSCEPTIBILITIES PERFORMED ON PREVIOUS CULTURE WITHIN THE LAST 5 DAYS. Performed at [facility], [address] [place], [place] [address]    Report Status 01/18/2019 FINAL  Final  Blood Culture ID Panel (Reflexed)     Status: Abnormal   Collection Time: 01/15/19 12:00 PM  Result Value Ref Range Status   Enterococcus species NOT  DETECTED NOT DETECTED Final   Listeria monocytogenes NOT DETECTED NOT DETECTED Final   Staphylococcus species NOT DETECTED NOT DETECTED Final   Staphylococcus aureus (BCID) NOT DETECTED NOT DETECTED Final   Streptococcus species NOT DETECTED NOT DETECTED Final   Streptococcus agalactiae NOT DETECTED NOT DETECTED Final   Streptococcus pneumoniae NOT DETECTED NOT DETECTED Final   Streptococcus pyogenes NOT DETECTED NOT DETECTED Final   Acinetobacter baumannii NOT DETECTED NOT DETECTED Final   Enterobacteriaceae species DETECTED (A) NOT DETECTED Final    Comment: Enterobacteriaceae represent a large family of gram-negative bacteria, not a single organism. CRITICAL RESULT CALLED TO, READ BACK BY AND VERIFIED WITH: B GREEN PHARMD 01/16/19 0308 JDW    Enterobacter cloacae complex NOT DETECTED NOT DETECTED Final   Escherichia coli NOT DETECTED NOT DETECTED Final   Klebsiella oxytoca NOT DETECTED NOT DETECTED Final   Klebsiella pneumoniae NOT DETECTED NOT DETECTED Final   Proteus species DETECTED (A) NOT DETECTED Final    Comment: CRITICAL RESULT CALLED TO, READ BACK BY AND VERIFIED WITH: B GREEN PHARMD 01/16/19 0308 JDW    Serratia marcescens NOT DETECTED NOT DETECTED Final   Carbapenem resistance NOT DETECTED NOT DETECTED Final   Haemophilus influenzae NOT DETECTED NOT DETECTED Final   Neisseria meningitidis NOT DETECTED NOT DETECTED Final   Pseudomonas aeruginosa NOT DETECTED NOT DETECTED Final   Candida albicans NOT DETECTED NOT DETECTED Final   Candida glabrata NOT DETECTED NOT DETECTED Final   Candida krusei NOT DETECTED NOT DETECTED Final   Candida parapsilosis NOT DETECTED NOT DETECTED Final   Candida tropicalis NOT DETECTED NOT DETECTED Final    Comment: Performed at [facility], [address] [place], [place] [address]  Culture, blood (Routine x 2)     Status: Abnormal   Collection Time: 01/15/19 12:02 PM   Specimen: BLOOD RIGHT FOREARM  Result Value Ref Range Status    Specimen Description   Final    BLOOD RIGHT FOREARM Performed at [facility], [address] [place], [place] [address]    Special Requests   Final    BOTTLES DRAWN AEROBIC AND ANAEROBIC Blood Culture results may not be optimal due to an excessive volume of blood received in culture bottles Performed at [facility], [address], [place], [place] [address]    Culture  Setup Time   Final    IN BOTH AEROBIC AND ANAEROBIC BOTTLES GRAM VARIABLE ROD CRITICAL VALUE NOTED.  VALUE IS CONSISTENT WITH PREVIOUSLY REPORTED AND CALLED VALUE. Performed at [facility], [address], [place], [place] [address]    Culture PROTEUS MIRABILIS (A)  Final   Report Status 01/18/2019 FINAL  Final   Organism ID, Bacteria PROTEUS MIRABILIS  Final      Susceptibility   Proteus mirabilis - MIC*    AMPICILLIN <=2 SENSITIVE Sensitive     CEFAZOLIN <=4 SENSITIVE Sensitive     CEFEPIME <=1 SENSITIVE Sensitive     CEFTAZIDIME <=1 SENSITIVE Sensitive     CEFTRIAXONE <=1 SENSITIVE Sensitive     CIPROFLOXACIN <=0.25 SENSITIVE Sensitive     GENTAMICIN <=1 SENSITIVE Sensitive     IMIPENEM 2 SENSITIVE Sensitive     TRIMETH/SULFA <=20 SENSITIVE Sensitive     AMPICILLIN/SULBACTAM <=2 SENSITIVE Sensitive     PIP/TAZO <=4 SENSITIVE Sensitive     * PROTEUS MIRABILIS  SARS CORONAVIRUS 2 (TAT 6-24 HRS) Nasopharyngeal Nasopharyngeal Swab     Status: None   Collection Time: 01/15/19 12:31 PM   Specimen: Nasopharyngeal Swab  Result Value Ref Range Status   SARS Coronavirus 2 NEGATIVE NEGATIVE Final    Comment: (NOTE) SARS-CoV-2 target nucleic acids are NOT DETECTED. The SARS-CoV-2 RNA is generally detectable in upper and lower respiratory specimens during the acute phase of infection. Negative results do not preclude SARS-CoV-2 infection, do not rule out co-infections with other pathogens, and should not be used as the sole basis for treatment or other patient management  decisions. Negative results must be combined with clinical observations, patient history, and epidemiological information. The expected result is Negative. Fact Sheet for Patients: HairSlick.no Fact Sheet for Healthcare Providers: quierodirigir.com This test is not yet approved or cleared by the [place] FDA and  has been authorized for detection and/or diagnosis of SARS-CoV-2 by FDA under an Emergency Use Authorization (EUA). This EUA will remain  in effect (meaning this test can be used) for the duration of the COVID-19 declaration under Section 56 4(b)(1) of the Act, 21 U.S.C. section 360bbb-3(b)(1), unless the authorization is terminated or revoked sooner. Performed at [facility], [address], [place], [place] [address]   Culture, blood (routine x 2)     Status: None (Preliminary result)   Collection Time: 01/17/19  9:04 AM   Specimen: BLOOD RIGHT HAND  Result Value Ref Range Status   Specimen Description   Final    BLOOD RIGHT HAND Performed at [facility], [address], [place], [place] [address]    Special Requests   Final    BOTTLES DRAWN AEROBIC AND ANAEROBIC Blood Culture adequate volume Performed at [facility], [address] [place], [place] [address]    Culture   Final    NO GROWTH 3 DAYS Performed at [facility], [address], [place], [place] [address]    Report Status PENDING  Incomplete  Culture, blood (routine x 2)     Status: None (Preliminary result)   Collection Time: 01/17/19  9:08  AM   Specimen: BLOOD LEFT HAND  Result Value Ref Range Status   Specimen Description   Final    BLOOD LEFT HAND Performed at [facility], [address] [place], [place] [address]    Special Requests   Final    BOTTLES DRAWN AEROBIC ONLY Blood Culture adequate volume Performed at [facility], [address] [place], [place] [address]    Culture   Final    NO GROWTH 3 DAYS Performed at [facility], [address], [place], [place] [address]    Report Status PENDING  Incomplete  Urine culture     Status: None   Collection Time: 01/17/19  6:56 PM   Specimen: In/Out Cath Urine  Result Value Ref Range Status   Specimen Description   Final    IN/OUT CATH URINE Performed at [facility], [address], [place], [place] [address]    Special Requests   Final    NONE Performed at [facility], [address], [place], [place] [address]    Culture   Final    NO GROWTH Performed at [facility], [address], [place], [place] [address]    Report Status 01/18/2019 FINAL  Final  Culture, blood (routine x 2)     Status: None (Preliminary result)   Collection Time: 01/19/19  1:45 PM   Specimen: BLOOD RIGHT HAND  Result Value Ref Range Status   Specimen Description   Final    BLOOD RIGHT HAND Performed at [facility], [address], [place], [place] [address]    Special Requests   Final    BOTTLES DRAWN AEROBIC AND ANAEROBIC Blood Culture adequate volume Performed at [facility], [address] [place], [place] [address]    Culture   Final    NO GROWTH < 24 HOURS Performed at [facility], [address] [place], [place] [address]    Report Status PENDING  Incomplete  Culture, blood (routine x 2)     Status: None (Preliminary result)   Collection Time: 01/19/19  1:45 PM   Specimen: BLOOD  Result Value Ref Range Status   Specimen Description   Final    BLOOD LEFT ANTECUBITAL Performed at [facility], [address] [place], [place] [address]    Special Requests   Final    BOTTLES DRAWN AEROBIC AND ANAEROBIC Blood Culture adequate volume Performed at [facility], [address], [place], [place] [address]    Culture   Final    NO GROWTH < 24  HOURS Performed at [facility], [address], [place], [place] [address]    Report Status PENDING  Incomplete     RN Pressure Injury Documentation: Pressure Injury 01/16/19 Sacrum Posterior;Mid Stage III -  Full thickness tissue loss. Subcutaneous fat may be visible but bone, tendon or muscle are NOT exposed. (Active)  01/16/19 0800  Location: Sacrum  Location Orientation: Posterior;Mid  Staging: Stage III -  Full thickness tissue loss. Subcutaneous fat may be visible but bone, tendon or muscle are NOT exposed.  Wound Description (Comments):   Present on Admission: Yes    Radiology Studies: No results found.  Scheduled Meds: . carbidopa-levodopa  1.5 tablet Oral 5 X Daily  . Chlorhexidine Gluconate Cloth  6 each Topical Daily  . enoxaparin (LOVENOX) injection  40 mg Subcutaneous Q24H  . feeding supplement (  ENSURE ENLIVE)  237 mL Oral BID BM  . feeding supplement (PRO-STAT SUGAR FREE 64)  30 mL Oral BID  . mouth rinse  15 mL Mouth Rinse BID  . mineral oil  1 enema Rectal Once  . mirabegron ER  50 mg Oral Daily  . multivitamin with minerals  1 tablet Oral Daily  . polyethylene glycol  17 g Oral BID  . senna-docusate  1 tablet Oral BID   Continuous Infusions: .  ceFAZolin (ANCEF) IV 2 g (01/20/19 1643)    LOS: 5 days   Kerney Elbe, DO [facility] PAGER is on [place]  If 7PM-7AM, please contact night-coverage [email or web address]

## 2019-01-20 NOTE — Progress Notes (Signed)
  Speech Language Pathology Treatment: Dysphagia  Patient Details Name: Mark Mathews MRN: [id number] DOB: 1940-04-25 Today's Date: 01/20/2019 Time: 8341-9622 SLP Time Calculation (min) (ACUTE ONLY): 34 min  Assessment / Plan / Recommendation Clinical Impression  Pt fully alert today but appears confused stating he wants to go home and that he has not consumed intake since he was here.  SLP assisted to reposition pt using reverse trendelenburg to compensate for his leg positioning and discomfort.  Pt able to hold his own sandwich and cup with straw to feed himself.  He does take large boluses and does not clear consistently before taking more requiring moderate verbal cues for clearance.  NO indication of aspiration or significant dysphagia.  Educated pt to importance of eating small amounts given his vomiting yesterday - which he denied.  Pt consumed an entire soft sandwich and 3 ounces of water.  Puree bolus to help orally clear food helpful.    Advised pt to consider RMST to improve phonation/cough strength and swallowing. He advised he wants to speak to his son regarding this issue - Provided information in writing for pt and his son re: RMST.  Will advance diet to facilitate pt's self feeding for airway protection, autonomy.  Will follow up next date to assure po tolerance and initiate RMST if pt/son agreeable.    HPI HPI: 78 year old male with past medical history of Parkinson's, bedbound, chronic indwelling catheter UTI, sacral decubitus ulcer who presented to the ED today with altered mental status x1 day      SLP Plan  Continue with current plan of care       Recommendations  Diet recommendations: Dysphagia 3 (mechanical soft);Thin liquid Liquids provided via: Straw Medication Administration: Whole meds with puree Supervision: Staff to assist with self feeding;Full supervision/cueing for compensatory strategies Compensations: Minimize environmental distractions;Slow rate;Small  sips/bites Postural Changes and/or Swallow Maneuvers: Seated upright 90 degrees;Upright 30-60 min after meal(reverse trendelenburg helpful for optimal positioning)                Oral Care Recommendations: (oral care after intake) Follow up Recommendations: 24 hour supervision/assistance SLP Visit Diagnosis: Dysphagia, unspecified (R13.10) Plan: Continue with current plan of care       GO                Macario Golds 01/20/2019, 12:55 PM Luanna Salk, [facility] SLP [place] Pager [phone] Office [phone]

## 2019-01-21 ENCOUNTER — Inpatient Hospital Stay (HOSPITAL_COMMUNITY): Payer: Medicare Other

## 2019-01-21 DIAGNOSIS — G20 Parkinson's disease: Secondary | ICD-10-CM

## 2019-01-21 DIAGNOSIS — A419 Sepsis, unspecified organism: Secondary | ICD-10-CM

## 2019-01-21 DIAGNOSIS — R6520 Severe sepsis without septic shock: Secondary | ICD-10-CM

## 2019-01-21 DIAGNOSIS — R400 Somnolence: Secondary | ICD-10-CM

## 2019-01-21 DIAGNOSIS — R7881 Bacteremia: Secondary | ICD-10-CM

## 2019-01-21 DIAGNOSIS — F0281 Dementia in other diseases classified elsewhere with behavioral disturbance: Secondary | ICD-10-CM

## 2019-01-21 LAB — BLOOD GAS, ARTERIAL
Acid-Base Excess: 3.8 mmol/L — ABNORMAL HIGH (ref 0.0–2.0)
Allens test (pass/fail): POSITIVE — AB
Bicarbonate: 27.5 mmol/L (ref 20.0–28.0)
Drawn by: 25788
O2 Saturation: 95.7 %
Patient temperature: 99.1
pCO2 arterial: 40.6 mmHg (ref 32.0–48.0)
pH, Arterial: 7.447 (ref 7.350–7.450)
pO2, Arterial: 80.2 mmHg — ABNORMAL LOW (ref 83.0–108.0)

## 2019-01-21 LAB — CBC WITH DIFFERENTIAL/PLATELET
Abs Immature Granulocytes: 0.26 10*3/uL — ABNORMAL HIGH (ref 0.00–0.07)
Basophils Absolute: 0.1 10*3/uL (ref 0.0–0.1)
Basophils Relative: 1 %
Eosinophils Absolute: 0.2 10*3/uL (ref 0.0–0.5)
Eosinophils Relative: 2 %
HCT: 35.8 % — ABNORMAL LOW (ref 39.0–52.0)
Hemoglobin: 11.4 g/dL — ABNORMAL LOW (ref 13.0–17.0)
Immature Granulocytes: 3 %
Lymphocytes Relative: 30 %
Lymphs Abs: 2.6 10*3/uL (ref 0.7–4.0)
MCH: 31.3 pg (ref 26.0–34.0)
MCHC: 31.8 g/dL (ref 30.0–36.0)
MCV: 98.4 fL (ref 80.0–100.0)
Monocytes Absolute: 0.9 10*3/uL (ref 0.1–1.0)
Monocytes Relative: 11 %
Neutro Abs: 4.7 10*3/uL (ref 1.7–7.7)
Neutrophils Relative %: 53 %
Platelets: 319 10*3/uL (ref 150–400)
RBC: 3.64 MIL/uL — ABNORMAL LOW (ref 4.22–5.81)
RDW: 12.4 % (ref 11.5–15.5)
WBC: 8.8 10*3/uL (ref 4.0–10.5)
nRBC: 0 % (ref 0.0–0.2)

## 2019-01-21 LAB — COMPREHENSIVE METABOLIC PANEL
ALT: 11 U/L (ref 0–44)
AST: 37 U/L (ref 15–41)
Albumin: 2.9 g/dL — ABNORMAL LOW (ref 3.5–5.0)
Alkaline Phosphatase: 64 U/L (ref 38–126)
Anion gap: 10 (ref 5–15)
BUN: 23 mg/dL (ref 8–23)
CO2: 24 mmol/L (ref 22–32)
Calcium: 8.3 mg/dL — ABNORMAL LOW (ref 8.9–10.3)
Chloride: 101 mmol/L (ref 98–111)
Creatinine, Ser: 0.73 mg/dL (ref 0.61–1.24)
GFR calc Af Amer: >60 mL/min (ref >60)
GFR calc non Af Amer: >60 mL/min (ref >60)
Glucose, Bld: 111 mg/dL — ABNORMAL HIGH (ref 70–99)
Potassium: 3.9 mmol/L (ref 3.5–5.1)
Sodium: 135 mmol/L (ref 135–145)
Total Bilirubin: 0.4 mg/dL (ref 0.3–1.2)
Total Protein: 6.5 g/dL (ref 6.5–8.1)

## 2019-01-21 LAB — VITAMIN B12: Vitamin B-12: 433 pg/mL (ref 180–914)

## 2019-01-21 LAB — MAGNESIUM: Magnesium: 2.3 mg/dL (ref 1.7–2.4)

## 2019-01-21 LAB — TSH: TSH: 1.385 u[IU]/mL (ref 0.350–4.500)

## 2019-01-21 LAB — PHOSPHORUS: Phosphorus: 3.1 mg/dL (ref 2.5–4.6)

## 2019-01-21 MED ORDER — SORBITOL 70 % SOLN
960.0000 mL | TOPICAL_OIL | Freq: Once | ORAL | Status: AC
  Administered 2019-01-21: 960 mL via RECTAL
  Filled 2019-01-21: qty 473

## 2019-01-21 MED ORDER — CARBIDOPA-LEVODOPA 25-100 MG PO TABS
1.0000 | ORAL_TABLET | Freq: Every day | ORAL | Status: DC
  Administered 2019-01-21 – 2019-01-22 (×7): 1 via ORAL
  Filled 2019-01-21 (×8): qty 1

## 2019-01-21 NOTE — Progress Notes (Signed)
Physical Therapy Treatment Patient Details Name: Mark Mathews MRN: [id number] DOB: 05/26/1940 Today's Date: 01/21/2019    History of Present Illness This is a 78 year old male with past medical history of Parkinson's, bedbound, chronic indwelling catheter with traumatic hypospadias and recurrent UTI with recent antibiotic use with noted loose stools, sacral decubitus ulcer who presented to the ED 11/13 with altered mental status x1 day    PT Comments    Pt tolerated sitting EOB today with close min/guard A.  In sitting, PT was able to assist in extension of B LE to aid in decreasing hip flexion, so both feet could be on the floor. Con't to recommend home with HHPT per conversation with son the other day.  He reports that an outside MD was trying to get pt a reclining w/c.    Follow Up Recommendations  Home health PT;Supervision/Assistance - 24 hour     Equipment Recommendations  Other (comment)(son reports an outside MD was trying to get a reclining w/c)    Recommendations for Other Services       Precautions / Restrictions Precautions Precautions: Fall;Other (comment) Precaution Comments: contractures    Mobility  Bed Mobility Overal bed mobility: Needs Assistance Bed Mobility: Supine to Sit;Sit to Supine     Supine to sit: Total assist;+2 for physical assistance Sit to supine: Total assist;+2 for physical assistance   General bed mobility comments: Use of bed pads to get pt to EOB and back supine. Sat EOB for 10-12 mins.  Transfers                    Ambulation/Gait                 Stairs             Wheelchair Mobility    Modified Rankin (Stroke Patients Only)       Balance Overall balance assessment: Needs assistance Sitting-balance support: Bilateral upper extremity supported;Feet supported;Feet unsupported Sitting balance-Leahy Scale: Fair Sitting balance - Comments: Pt with B UE support and PT worked on extending LE so that  eventually his hips were relaxed enough for his feet to both be on the floor.  Able to sit with close MIN/guard til the end when he began to fatigue and list to the R with MIN A. Postural control: Right lateral lean                                  Cognition Arousal/Alertness: Awake/alert Behavior During Therapy: WFL for tasks assessed/performed Overall Cognitive Status: No family/caregiver present to determine baseline cognitive functioning Area of Impairment: Orientation                 Orientation Level: Disoriented to;Place;Situation   Memory: Decreased short-term memory Following Commands: Follows one step commands with increased time Safety/Judgement: Decreased awareness of deficits Awareness: Intellectual          Exercises      General Comments        Pertinent Vitals/Pain Pain Assessment: Faces Faces Pain Scale: No hurt    Home Living                      Prior Function            PT Goals (current goals can now be found in the care plan section) Acute Rehab PT Goals Patient Stated Goal: Pt to return home  PT Goal Formulation: With family Time For Goal Achievement: 02/01/19 Potential to Achieve Goals: Fair Progress towards PT goals: Progressing toward goals    Frequency    Min 2X/week      PT Plan Current plan remains appropriate    Co-evaluation              AM-PAC PT "6 Clicks" Mobility   Outcome Measure  Help needed turning from your back to your side while in a flat bed without using bedrails?: Total Help needed moving from lying on your back to sitting on the side of a flat bed without using bedrails?: Total Help needed moving to and from a bed to a chair (including a wheelchair)?: Total Help needed standing up from a chair using your arms (e.g., wheelchair or bedside chair)?: Total Help needed to walk in hospital room?: Total Help needed climbing 3-5 steps with a railing? : Total 6 Click Score: 6     End of Session   Activity Tolerance: Patient tolerated treatment well Patient left: in bed;with call bell/phone within reach;with bed alarm set   PT Visit Diagnosis: Other abnormalities of gait and mobility (R26.89)     Time: 1540-1601 PT Time Calculation (min) (ACUTE ONLY): 21 min  Charges:  $Therapeutic Activity: 8-22 mins                     Clydie Braun L. Katrinka Blazing, [place] Pager [phone] 01/21/2019    Enzo Montgomery 01/21/2019, 5:09 PM

## 2019-01-21 NOTE — Progress Notes (Signed)
PROGRESS NOTE    Mark Mathews  KGM:[id number] DOB: February 22, 1941 DOA: 01/15/2019 PCP: Kelton Pillar, MD  Brief Narrative:  This is a 78 year old male with past medical history of Parkinson's, bedbound, chronic indwelling catheter with traumatic hypospadias and recurrent UTI with recent antibiotic use with noted loose stools, sacral decubitus ulcer who presented to the ED 11/13 with altered mental status x1 day.  Patient was admitted to stepdown unit for severe sepsis with tachycardia, tachypnea, lactic acidosis 4.3 ->>>1.8, altered mental status, WBC 30.  Initially with concern for urinary source versus GI source.  Started on broad-spectrum antibiotics with cefepime/vancomycin/Flagyl as well as IV fluid resuscitation. CT abdomen pelvis in ED showed new onset bilateral hydroureteronephrosis down to bladder as well as stercoral colitis and large volume fecal impaction.  Cultures positive for Proteus and antibiotics changed to ceftriaxone monotherapy.  Urology was consulted who recommended bilateral PCN if patient is viable long-term and did not recommend Foley catheter change.  Manual disimpaction was attempted by nursing but unsuccessful on 11/14.  He was started on MiraLAX.  Later in the day 11/14 patient noted to have persistently low BP in 80s/30s and given IV fluid bolus.  Transfer to Tele-med floor 11/15.  He was given to smog enemas along with Fleet enema and nursing states that he is improving his bowel regimen however KUB still shows a Large Stool burden.  We will continue aggressive bowel management and continue antibiotics at this time for Proteus bacteremia.   Patient was a little bit more somnolent again today so we will work this up and obtain a head CT, check an RPR, TSH and vitamin B12 level as well as an ABG and I will discuss the case with Neurology given the patient is on Sinemet and has significant Parkinson's disease  Assessment & Plan:   Principal Problem:   Severe sepsis  ([place]) Active Problems:   Pressure injury of skin   Dementia with behavioral disturbance ([place])   Bacteremia  Acute metabolic encephalopathy secondary to Proteus bacteremia and severe sepsis, likely urinary source  with now somnolence -On admission: febrile, tachycardic, tachypneic, altered mental status, lactic acid 4.3., Covid negative.  -Day 7 antibiotics: Initially received vancomycin/cefepime in ED, broadened to cefepime/vancomycin/Flagyl and changed to ceftriaxone monotherapy with resulted blood cultures, deescalated to ancef 11/16.  -WBC has improved and went from 11.4 -> 8.8 -Awake yesterday but was more somnolent today,  -Continue Antibiotics -Discontinued Gabapentin -Replete Magnesium -WBC is improved -Remains somewhat confused and agitated today he was more sleepy so we will obtain an ABG as well as a head CT without contrast  General Malaise and Somnolence -? Gabapentin vs. Persistent bacteremia (afebrile, no leukocytosis, blood cultures from 11/15 negative) vs. Debility vs. Other -Discontinued gabapentin -Repeat blood cultures show no growth to date less than 24 hours -Check RPR, TSH, vitamin B12 level as well as MRI Brain w/o Contrast, EEG, and an ABG -We will discuss with Neurology for further evaluation given the patient is on Sinemet and may need a dose adjustment; Dr. Cheral Marker recommends obtaining an EEG and an MRI and also recommended reducing Sinemet to 1 tablet 5 times a day -Patient's son waited and states that he goes to sports today where he is a wide awake and has periods of really sleeps quite a bit -We will consider palliative care consult if not improving  Bilateral Hydronephrosis -Urology consulted: recommended bilateral PCN if patient is viable long-term which would allow for catheter removal. did not recommend Foley catheter change.  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 No bowel dilatation or evidence of obstruction. No free air or radiopaque calculi. Osteopenia with degenerative changes of the spine. Hernia repair mesh noted over the pelvis." -Consider contacting GI in a.m. if no stools but nursing states that he had several good bowel movement after smog enema  Recurrent UTI in setting of chronic indwelling catheter -Completed course of Keflex at the end of September and course of Macrobid at the end of October. Proteus Mirabilis UTI 11/26/2018 -Plan as above likely has another UTI and Proteus bacteremia  AKI secondary to severe sepsis -Resolved with fluids -BUN/creatinine of 23/0.73  Parkinson'scontinue Sinemet IR to prevent withdrawal.  -seen by SLP and they are recommending a dysphagia 3 diet but today they tried to reevaluate he is too somnolent -Hold remaining home meds including Artane and recommends to  -We will discuss with neurology for further evaluation and Dr. Otelia Limes recommends reducing a 1-1/2 doses of Sinemet 5 times a day and recommends 1 tablet of Sinemet 5 times a day now and obtain an MRI and an EEG  Bilateral lower extremity paresthesias -Resolved with gabapentin the day before yesterday. 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 -Discontinued gabapentin with new somnolence  Nongap Metabolic Acidosis -Secondary to sepsis, AKI and hyperchloremia  -Resolved -Patient's CO2 is now 24, chloride is 101, and  anion gap is 10 -Continue to monitor and trend CMP  DVT prophylaxis: Enoxaparin 40 mg sq q24h Code Status: DO NOT RESUSCITATE  Family Communication: No family present at bedside but updated son via the telephone Disposition Plan: Pending Clinical Improvement but likely SNF  Consultants:   Urology   Procedures:  None   Antimicrobials:  Anti-infectives (From admission, onward)   Start     Dose/Rate Route Frequency Ordered Stop   01/19/19 0900  ceFAZolin (ANCEF) IVPB 2g/100 mL premix     2 g 200 mL/hr over 30 Minutes Intravenous Every 8 hours 01/18/19 1137     01/18/19 1400  ceFAZolin (ANCEF) IVPB 2g/100 mL premix  Status:  Discontinued     2 g 200 mL/hr over 30 Minutes Intravenous Every 8 hours 01/18/19 1123 01/18/19 1137   01/16/19 1400  vancomycin (VANCOCIN) IVPB 750 mg/150 ml premix  Status:  Discontinued     750 mg 150 mL/hr over 60 Minutes Intravenous Every 24 hours 01/15/19 1745 01/16/19 0327   01/16/19 0500  cefTRIAXone (ROCEPHIN) 2 g in sodium chloride 0.9 % 100 mL IVPB  Status:  Discontinued     2 g 200 mL/hr over 30 Minutes Intravenous Daily 01/16/19 0326 01/18/19 1123   01/15/19 2300  ceFEPIme (MAXIPIME) 2 g in sodium chloride 0.9 % 100 mL IVPB  Status:  Discontinued     2 g 200 mL/hr over 30 Minutes Intravenous Every 12 hours 01/15/19 1745 01/16/19 0327   01/15/19 2000  metroNIDAZOLE (FLAGYL) IVPB 500 mg  Status:  Discontinued     500 mg 100 mL/hr over 60 Minutes Intravenous  Every 8 hours 01/15/19 1916 01/17/19 1329   01/15/19 1930  ceFEPIme (MAXIPIME) 2 g in sodium chloride 0.9 % 100 mL IVPB  Status:  Discontinued     2 g 200 mL/hr over 30 Minutes Intravenous  Once 01/15/19 1916 01/15/19 1921   01/15/19 1930  vancomycin (VANCOCIN) IVPB 1000 mg/200 mL premix  Status:  Discontinued     1,000 mg 200 mL/hr over 60 Minutes Intravenous  Once 01/15/19 1916 01/15/19 1921   01/15/19 1315  vancomycin (VANCOCIN) IVPB 1000 mg/200 mL premix     1,000 mg 200 mL/hr over 60  Minutes Intravenous  Once 01/15/19 1314 01/15/19 1505   01/15/19 1230  ceFEPIme (MAXIPIME) 2 g in sodium chloride 0.9 % 100 mL IVPB     2 g 200 mL/hr over 30 Minutes Intravenous  Once 01/15/19 1228 01/15/19 1348     Subjective: Seen and examined at bedside and he is more somnolent today and was resting and did not really participate in examination.  Would briefly wake up and fall back to sleep.  No nausea or vomiting.  Nursing states that he had no bowel movement overnight and KUB shows that he still constipated.  No other concerns or complaints at this time.  Objective: Vitals:   01/20/19 1346 01/20/19 2057 01/21/19 0431 01/21/19 1408  BP: 118/66 112/69 (!) 98/58 118/72  Pulse: 79 87 73 76  Resp: Temp: 98.7 F (37.1 C) 98.1 F (36.7 C) 98.3 F (36.8 C) 99.1 F (37.3 C)  TempSrc: Oral Oral Oral Oral  SpO2: 97% 98% 96% 96%  Weight:      Height:        Intake/Output Summary (Last 24 hours) at 01/21/2019 1607 Last data filed at 01/21/2019 1500 Gross per 24 hour  Intake 1020 ml  Output 2425 ml  Net -1405 ml   Filed Weights   01/15/19 1205  Weight: 61.2 kg   Examination: Physical Exam:  Constitutional: Elderly Caucasian male with a resting tremor and inNAD and appears somnolent Eyes: Lids and conjunctivae normal, sclerae anicteric  ENMT: External Ears, Nose appear normal. Grossly normal hearing. Mucous membranes are moist.  Neck: Appears normal, supple, no cervical masses, normal ROM, no appreciable thyromegaly; no JVD Respiratory: Diminished to auscultation bilaterally, no wheezing, rales, rhonchi or crackles. Normal respiratory effort and patient is not tachypenic. No accessory muscle use. Unlabored Breathing  Cardiovascular: RRR, no murmurs / rubs / gallops. S1 and S2 auscultated. No extremity edema.  Abdomen: Soft, non-tender, non-distended. Bowel sounds positive x4.  GU: Deferred. Has a indwelling catheter  Musculoskeletal: Lower extremities are atrophied  and have some contractures Skin: No rashes, lesions, ulcers on a limited skin evaluation but did not turn to view sacral decubitus. No induration; Warm and dry.  Neurologic: CN 2-12 grossly intact with no focal deficits. Romberg sign and cerebellar reflexes not assessed.  Psychiatric: Impaired judgment and insight. Somnolent and Drowsy  Data Reviewed: I have personally reviewed following labs and imaging studies  CBC: Recent Labs  Lab 01/15/19 1208  01/17/19 0230 01/18/19 0644 01/19/19 0716 01/20/19 0536 01/21/19 0607  WBC 5.8   < > 18.9* 11.4* 9.6 9.0 8.8  NEUTROABS 5.4  --   --   --   --   --  4.7  HGB 11.9*   < > 9.6* 10.7* 10.9* 11.2* 11.4*  HCT 37.6*   < > 30.5* 34.0* 34.7* 34.6* 35.8*  MCV 100.0   < > 101.7* 101.5* 99.7  97.7 98.4  PLT 269   < > 183 215 226 265 319   < > = values in this interval not displayed.   Basic Metabolic Panel: Recent Labs  Lab 01/17/19 0230 01/18/19 0644 01/19/19 0716 01/20/19 0536 01/21/19 0607  NA 139 141 137 137 135  K 3.1* 4.0 3.7 3.9 3.9  CL 112* 111 105 104 101  CO2 21* 23 25 25 24   GLUCOSE 89 89 103* 98 111*  BUN 24* 15 12 14 23   CREATININE 0.66 0.75 0.77 0.86 0.73  CALCIUM 7.9* 8.4* 8.5* 8.4* 8.3*  MG 2.0  --  1.7  --  2.3  PHOS  --   --   --   --  3.1   GFR: Estimated Creatinine Clearance: 65.9 mL/min (by C-G formula based on SCr of 0.73 mg/dL). Liver Function Tests: Recent Labs  Lab 01/15/19 1208 01/16/19 0216 01/20/19 0536 01/21/19 0607  AST 45* 88* 31 37  ALT 12 61* 16 11  ALKPHOS 55 56 63 64  BILITOT 0.9 0.7 0.2* 0.4  PROT 7.0 6.2* 6.5 6.5  ALBUMIN 3.2* 2.7* 2.7* 2.9*   No results for input(s): LIPASE, AMYLASE in the last 168 hours. No results for input(s): AMMONIA in the last 168 hours. Coagulation Profile: Recent Labs  Lab 01/15/19 1208 01/16/19 0216  INR 1.2 1.4*   Cardiac Enzymes: No results for input(s): CKTOTAL, CKMB, CKMBINDEX, TROPONINI in the last 168 hours. BNP (last 3 results) No results for  input(s): PROBNP in the last 8760 hours. HbA1C: No results for input(s): HGBA1C in the last 72 hours. CBG: No results for input(s): GLUCAP in the last 168 hours. Lipid Profile: No results for input(s): CHOL, HDL, LDLCALC, TRIG, CHOLHDL, LDLDIRECT in the last 72 hours. Thyroid Function Tests: No results for input(s): TSH, T4TOTAL, FREET4, T3FREE, THYROIDAB in the last 72 hours. Anemia Panel: No results for input(s): VITAMINB12, FOLATE, FERRITIN, TIBC, IRON, RETICCTPCT in the last 72 hours. Sepsis Labs: Recent Labs  Lab 01/15/19 1202 01/15/19 1408 01/15/19 1733 01/15/19 2052 01/16/19 0216  PROCALCITON  --   --   --   --  63.62  LATICACIDVEN 4.3* 2.5* 3.1* 1.8  --     Recent Results (from the past 240 hour(s))  Culture, blood (Routine x 2)     Status: Abnormal   Collection Time: 01/15/19 12:00 PM   Specimen: BLOOD  Result Value Ref Range Status   Specimen Description   Final    BLOOD LEFT ANTECUBITAL Performed at [facility], [address] [place], [place]    Special Requests   Final    BOTTLES DRAWN AEROBIC AND ANAEROBIC Blood Culture adequate volume Performed at [facility], [address] [place], [place]    Culture  Setup Time   Final    IN BOTH AEROBIC AND ANAEROBIC BOTTLES GRAM VARIABLE ROD CRITICAL RESULT CALLED TO, READ BACK BY AND VERIFIED WITH: B GREEN PHARMD 01/16/19 0308 JDW    Culture (A)  Final    PROTEUS MIRABILIS SUSCEPTIBILITIES PERFORMED ON PREVIOUS CULTURE WITHIN THE LAST 5 DAYS. Performed at [facility], [address], [place], [address] [place]    Report Status 01/18/2019 FINAL  Final  Blood Culture ID Panel (Reflexed)     Status: Abnormal   Collection Time: 01/15/19 12:00 PM  Result Value Ref Range Status   Enterococcus species NOT DETECTED NOT DETECTED Final   Listeria monocytogenes NOT DETECTED NOT DETECTED Final  Staphylococcus species NOT DETECTED NOT DETECTED Final    Staphylococcus aureus (BCID) NOT DETECTED NOT DETECTED Final   Streptococcus species NOT DETECTED NOT DETECTED Final   Streptococcus agalactiae NOT DETECTED NOT DETECTED Final   Streptococcus pneumoniae NOT DETECTED NOT DETECTED Final   Streptococcus pyogenes NOT DETECTED NOT DETECTED Final   Acinetobacter baumannii NOT DETECTED NOT DETECTED Final   Enterobacteriaceae species DETECTED (A) NOT DETECTED Final    Comment: Enterobacteriaceae represent a large family of gram-negative bacteria, not a single organism. CRITICAL RESULT CALLED TO, READ BACK BY AND VERIFIED WITH: B GREEN PHARMD 01/16/19 0308 JDW    Enterobacter cloacae complex NOT DETECTED NOT DETECTED Final   Escherichia coli NOT DETECTED NOT DETECTED Final   Klebsiella oxytoca NOT DETECTED NOT DETECTED Final   Klebsiella pneumoniae NOT DETECTED NOT DETECTED Final   Proteus species DETECTED (A) NOT DETECTED Final    Comment: CRITICAL RESULT CALLED TO, READ BACK BY AND VERIFIED WITH: B GREEN PHARMD 01/16/19 0308 JDW    Serratia marcescens NOT DETECTED NOT DETECTED Final   Carbapenem resistance NOT DETECTED NOT DETECTED Final   Haemophilus influenzae NOT DETECTED NOT DETECTED Final   Neisseria meningitidis NOT DETECTED NOT DETECTED Final   Pseudomonas aeruginosa NOT DETECTED NOT DETECTED Final   Candida albicans NOT DETECTED NOT DETECTED Final   Candida glabrata NOT DETECTED NOT DETECTED Final   Candida krusei NOT DETECTED NOT DETECTED Final   Candida parapsilosis NOT DETECTED NOT DETECTED Final   Candida tropicalis NOT DETECTED NOT DETECTED Final    Comment: Performed at [facility], [address] [place], [place] [address]  Culture, blood (Routine x 2)     Status: Abnormal   Collection Time: 01/15/19 12:02 PM   Specimen: BLOOD RIGHT FOREARM  Result Value Ref Range Status   Specimen Description   Final    BLOOD RIGHT FOREARM Performed at [facility], [address] [place], [place] [address]     Special Requests   Final    BOTTLES DRAWN AEROBIC AND ANAEROBIC Blood Culture results may not be optimal due to an excessive volume of blood received in culture bottles Performed at [facility], [address] [place], [place] [address]    Culture  Setup Time   Final    IN BOTH AEROBIC AND ANAEROBIC BOTTLES GRAM VARIABLE ROD CRITICAL VALUE NOTED.  VALUE IS CONSISTENT WITH PREVIOUSLY REPORTED AND CALLED VALUE. Performed at [facility], [address] [place], [place] [address]    Culture PROTEUS MIRABILIS (A)  Final   Report Status 01/18/2019 FINAL  Final   Organism ID, Bacteria PROTEUS MIRABILIS  Final      Susceptibility   Proteus mirabilis - MIC*    AMPICILLIN <=2 SENSITIVE Sensitive     CEFAZOLIN <=4 SENSITIVE Sensitive     CEFEPIME <=1 SENSITIVE Sensitive     CEFTAZIDIME <=1 SENSITIVE Sensitive     CEFTRIAXONE <=1 SENSITIVE Sensitive     CIPROFLOXACIN <=0.25 SENSITIVE Sensitive     GENTAMICIN <=1 SENSITIVE Sensitive     IMIPENEM 2 SENSITIVE Sensitive     TRIMETH/SULFA <=20 SENSITIVE Sensitive     AMPICILLIN/SULBACTAM <=2 SENSITIVE Sensitive     PIP/TAZO <=4 SENSITIVE Sensitive     * PROTEUS MIRABILIS  SARS CORONAVIRUS 2 (TAT 6-24 HRS) Nasopharyngeal Nasopharyngeal Swab     Status: None   Collection Time: 01/15/19 12:31 PM   Specimen: Nasopharyngeal Swab  Result Value Ref Range Status  SARS Coronavirus 2 NEGATIVE NEGATIVE Final    Comment: (NOTE) SARS-CoV-2 target nucleic acids are NOT DETECTED. The SARS-CoV-2 RNA is generally detectable in upper and lower respiratory specimens during the acute phase of infection. Negative results do not preclude SARS-CoV-2 infection, do not rule out co-infections with other pathogens, and should not be used as the sole basis for treatment or other patient management decisions. Negative results must be combined with clinical observations, patient history, and epidemiological information. The expected result  is Negative. Fact Sheet for Patients: HairSlick.no Fact Sheet for Healthcare Providers: quierodirigir.com This test is not yet approved or cleared by the [place] FDA and  has been authorized for detection and/or diagnosis of SARS-CoV-2 by FDA under an Emergency Use Authorization (EUA). This EUA will remain  in effect (meaning this test can be used) for the duration of the COVID-19 declaration under Section 56 4(b)(1) of the Act, 21 U.S.C. section 360bbb-3(b)(1), unless the authorization is terminated or revoked sooner. Performed at [facility], [address], [place], [place] [address]   Culture, blood (routine x 2)     Status: None (Preliminary result)   Collection Time: 01/17/19  9:04 AM   Specimen: BLOOD RIGHT HAND  Result Value Ref Range Status   Specimen Description   Final    BLOOD RIGHT HAND Performed at [facility], [address] [place], [place] [address]    Special Requests   Final    BOTTLES DRAWN AEROBIC AND ANAEROBIC Blood Culture adequate volume Performed at [facility], [address] [place], [place] [address]    Culture   Final    NO GROWTH 4 DAYS Performed at [facility], [address], [place], [place] [address]    Report Status PENDING  Incomplete  Culture, blood (routine x 2)     Status: None (Preliminary result)   Collection Time: 01/17/19  9:08 AM   Specimen: BLOOD LEFT HAND  Result Value Ref Range Status   Specimen Description   Final    BLOOD LEFT HAND Performed at [facility], [address] [place], [place] [address]    Special Requests   Final    BOTTLES DRAWN AEROBIC ONLY Blood Culture adequate volume Performed at [facility], [address], [place], [place] [address]    Culture   Final    NO GROWTH 4 DAYS Performed at [facility], [address] [place], [place] [address]     Report Status PENDING  Incomplete  Urine culture     Status: None   Collection Time: 01/17/19  6:56 PM   Specimen: In/Out Cath Urine  Result Value Ref Range Status   Specimen Description   Final    IN/OUT CATH URINE Performed at [facility], [address], [place], [place] [address]    Special Requests   Final    NONE Performed at [facility], [address], [place], [place] [address]    Culture   Final    NO GROWTH Performed at [facility], [address] [place], [place] [address]    Report Status 01/18/2019 FINAL  Final  Culture, blood (routine x 2)     Status: None (Preliminary result)   Collection Time: 01/19/19  1:45 PM   Specimen: BLOOD RIGHT HAND  Result Value Ref Range Status   Specimen Description   Final    BLOOD RIGHT HAND Performed  at [facility], [address] [place], [place] [address]    Special Requests   Final    BOTTLES DRAWN AEROBIC AND ANAEROBIC Blood Culture adequate volume Performed at [facility], [address], [place], [place] [address]    Culture   Final    NO GROWTH 2 DAYS Performed at [facility], [address], [place], [place] [address]    Report Status PENDING  Incomplete  Culture, blood (routine x 2)     Status: None (Preliminary result)   Collection Time: 01/19/19  1:45 PM   Specimen: BLOOD  Result Value Ref Range Status   Specimen Description   Final    BLOOD LEFT ANTECUBITAL Performed at [facility], [address], [place], [place] [address]    Special Requests   Final    BOTTLES DRAWN AEROBIC AND ANAEROBIC Blood Culture adequate volume Performed at [facility], [address] [place], [place] [address]    Culture   Final    NO GROWTH 2 DAYS Performed at [facility], [address], [place], [place] [address]    Report Status PENDING  Incomplete     RN Pressure Injury  Documentation: Pressure Injury 01/16/19 Sacrum Posterior;Mid Stage III -  Full thickness tissue loss. Subcutaneous fat may be visible but bone, tendon or muscle are NOT exposed. (Active)  01/16/19 0800  Location: Sacrum  Location Orientation: Posterior;Mid  Staging: Stage III -  Full thickness tissue loss. Subcutaneous fat may be visible but bone, tendon or muscle are NOT exposed.  Wound Description (Comments):   Present on Admission: Yes   Radiology Studies: Dg Abd 1 View  Result Date: 01/21/2019 CLINICAL DATA:  78 year old male with constipation. EXAM: ABDOMEN - 1 VIEW COMPARISON:  CT of the abdomen pelvis dated 01/15/2019. FINDINGS: There is large amount of stool throughout the colon. No bowel dilatation or evidence of obstruction. No free air or radiopaque calculi. Osteopenia with degenerative changes of the spine. Hernia repair mesh noted over the pelvis. IMPRESSION: Constipation. No bowel obstruction. Electronically Signed   By: Elgie Collard M.D.   On: 01/21/2019 09:15    Scheduled Meds:  carbidopa-levodopa  1.5 tablet Oral 5 X Daily   Chlorhexidine Gluconate Cloth  6 each Topical Daily   enoxaparin (LOVENOX) injection  40 mg Subcutaneous Q24H   feeding supplement (ENSURE ENLIVE)  237 mL Oral BID BM   feeding supplement (PRO-STAT SUGAR FREE 64)  30 mL Oral BID   mouth rinse  15 mL Mouth Rinse BID   mirabegron ER  50 mg Oral Daily   multivitamin with minerals  1 tablet Oral Daily   polyethylene glycol  17 g Oral BID   senna-docusate  1 tablet Oral BID   sorbitol, milk of mag, mineral oil, glycerin (SMOG) enema  960 mL Rectal Once   Continuous Infusions:   ceFAZolin (ANCEF) IV 2 g (01/21/19 0936)    LOS: 6 days   Merlene Laughter, DO [facility] PAGER is on AMION  If 7PM-7AM, please contact night-coverage [email or web address]

## 2019-01-21 NOTE — Care Management Important Message (Signed)
Important Message  Patient Details IM Letter given to [facility] SW to present to the Patient Name: Mark Mathews MRN: [id number] Date of Birth: 10-02-1940   Medicare Important Message Given:  Yes     Kerin Salen 01/21/2019, 10:17 AM

## 2019-01-21 NOTE — Progress Notes (Signed)
SLP Cancellation Note  Patient Details Name: Mark Mathews MRN: [id number] DOB: September 18, 1940   Cancelled treatment:       Reason Eval/Treat Not Completed: Other (comment);Fatigue/lethargy limiting ability to participate(RN reports pt with lethargy this am, will continue efforts)   Macario Golds 01/21/2019, 3:43 PM  Luanna Salk, [place] [facility] SLP Acute Rehab Services Pager [phone] Office [phone]

## 2019-01-22 ENCOUNTER — Ambulatory Visit (HOSPITAL_COMMUNITY): Admit: 2019-01-22 | Payer: Medicare Other

## 2019-01-22 ENCOUNTER — Inpatient Hospital Stay (HOSPITAL_COMMUNITY)
Admission: EM | Admit: 2019-01-22 | Discharge: 2019-01-22 | Disposition: A | Discharge status: Home / Self Care | Payer: Medicare Other | Source: Home / Self Care | Attending: Internal Medicine | Admitting: Internal Medicine

## 2019-01-22 DIAGNOSIS — G9340 Encephalopathy, unspecified: Secondary | ICD-10-CM

## 2019-01-22 DIAGNOSIS — A4159 Other Gram-negative sepsis: Secondary | ICD-10-CM

## 2019-01-22 DIAGNOSIS — A498 Other bacterial infections of unspecified site: Secondary | ICD-10-CM

## 2019-01-22 LAB — COMPREHENSIVE METABOLIC PANEL
ALT: 13 U/L (ref 0–44)
AST: 39 U/L (ref 15–41)
Albumin: 2.7 g/dL — ABNORMAL LOW (ref 3.5–5.0)
Alkaline Phosphatase: 57 U/L (ref 38–126)
Anion gap: 9 (ref 5–15)
BUN: 23 mg/dL (ref 8–23)
CO2: 25 mmol/L (ref 22–32)
Calcium: 8.3 mg/dL — ABNORMAL LOW (ref 8.9–10.3)
Chloride: 105 mmol/L (ref 98–111)
Creatinine, Ser: 0.65 mg/dL (ref 0.61–1.24)
GFR calc Af Amer: >60 mL/min (ref >60)
GFR calc non Af Amer: >60 mL/min (ref >60)
Glucose, Bld: 96 mg/dL (ref 70–99)
Potassium: 3.9 mmol/L (ref 3.5–5.1)
Sodium: 139 mmol/L (ref 135–145)
Total Bilirubin: 0.4 mg/dL (ref 0.3–1.2)
Total Protein: 6.3 g/dL — ABNORMAL LOW (ref 6.5–8.1)

## 2019-01-22 LAB — CBC WITH DIFFERENTIAL/PLATELET
Abs Immature Granulocytes: 0.21 10*3/uL — ABNORMAL HIGH (ref 0.00–0.07)
Basophils Absolute: 0.1 10*3/uL (ref 0.0–0.1)
Basophils Relative: 1 %
Eosinophils Absolute: 0.4 10*3/uL (ref 0.0–0.5)
Eosinophils Relative: 4 %
HCT: 33.9 % — ABNORMAL LOW (ref 39.0–52.0)
Hemoglobin: 10.6 g/dL — ABNORMAL LOW (ref 13.0–17.0)
Immature Granulocytes: 2 %
Lymphocytes Relative: 32 %
Lymphs Abs: 3 10*3/uL (ref 0.7–4.0)
MCH: 31.1 pg (ref 26.0–34.0)
MCHC: 31.3 g/dL (ref 30.0–36.0)
MCV: 99.4 fL (ref 80.0–100.0)
Monocytes Absolute: 1 10*3/uL (ref 0.1–1.0)
Monocytes Relative: 10 %
Neutro Abs: 4.7 10*3/uL (ref 1.7–7.7)
Neutrophils Relative %: 51 %
Platelets: 323 10*3/uL (ref 150–400)
RBC: 3.41 MIL/uL — ABNORMAL LOW (ref 4.22–5.81)
RDW: 12.7 % (ref 11.5–15.5)
WBC: 9.3 10*3/uL (ref 4.0–10.5)
nRBC: 0 % (ref 0.0–0.2)

## 2019-01-22 LAB — RPR: RPR Ser Ql: NONREACTIVE

## 2019-01-22 LAB — MAGNESIUM: Magnesium: 2.1 mg/dL (ref 1.7–2.4)

## 2019-01-22 LAB — CULTURE, BLOOD (ROUTINE X 2)
Culture: NO GROWTH
Culture: NO GROWTH
Special Requests: ADEQUATE
Special Requests: ADEQUATE

## 2019-01-22 LAB — PHOSPHORUS: Phosphorus: 2.6 mg/dL (ref 2.5–4.6)

## 2019-01-22 MED ORDER — ONDANSETRON HCL 4 MG PO TABS: 4.0000 mg | ORAL_TABLET | Freq: Four times a day (QID) | ORAL | 0 refills | Status: DC | PRN

## 2019-01-22 MED ORDER — ADULT MULTIVITAMIN W/MINERALS CH: 1.0000 | ORAL_TABLET | Freq: Every day | ORAL | 0 refills | Status: DC

## 2019-01-22 MED ORDER — MIRABEGRON ER 50 MG PO TB24: 50.0000 mg | ORAL_TABLET | Freq: Every day | ORAL | 0 refills | Status: DC

## 2019-01-22 MED ORDER — CARBIDOPA-LEVODOPA 25-100 MG PO TABS: 1.0000 | ORAL_TABLET | Freq: Every day | ORAL | 0 refills | Status: AC

## 2019-01-22 MED ORDER — SENNOSIDES-DOCUSATE SODIUM 8.6-50 MG PO TABS: 1.0000 | ORAL_TABLET | Freq: Two times a day (BID) | ORAL | 0 refills | Status: DC

## 2019-01-22 MED ORDER — AMOXICILLIN 500 MG PO CAPS: 500.0000 mg | ORAL_CAPSULE | Freq: Three times a day (TID) | ORAL | 0 refills | Status: DC

## 2019-01-22 MED ORDER — AMOXICILLIN 500 MG PO CAPS
500.0000 mg | ORAL_CAPSULE | Freq: Three times a day (TID) | ORAL | Status: DC
  Administered 2019-01-22 (×2): 500 mg via ORAL
  Filled 2019-01-22 (×2): qty 1

## 2019-01-22 MED ORDER — LIP MEDEX EX OINT: 1.0000 "application " | TOPICAL_OINTMENT | CUTANEOUS | 0 refills | Status: DC | PRN

## 2019-01-22 MED ORDER — LORAZEPAM 2 MG/ML IJ SOLN
1.0000 mg | Freq: Once | INTRAMUSCULAR | Status: DC | PRN
  Filled 2019-01-22: qty 1

## 2019-01-22 MED ORDER — POLYETHYLENE GLYCOL 3350 17 G PO PACK: 17.0000 g | PACK | Freq: Two times a day (BID) | ORAL | 0 refills | Status: DC

## 2019-01-22 NOTE — Progress Notes (Signed)
OT Cancellation Note  Patient Details Name: GLEASON ARDOIN MRN: [id number] DOB: 1940-08-04   Cancelled Treatment:    Reason Eval/Treat Not Completed: OT screened, no needs identified, will sign off.  Pt for apparent discharge today.  Per chart review, pt lives at home with 24 hour caregivers, is bed bound, requires extensive assist with ADLs.  Family has needed DME (currently working on acquiring a reclining w/c.  No acute care needs identified.  OT will sign off at this time.   Lucille Passy, OTR/L [place] Pager [phone] Office [phone]   Lucille Passy M 01/22/2019, 3:05 PM

## 2019-01-22 NOTE — Procedures (Signed)
Patient Name: Mark Mathews  MRN: [id number]  Epilepsy Attending: Lora Havens  Referring Physician/Provider: Dr. Osborne Oman Date: 01/22/2019  Duration: 23.58 minutes  Patient history: 78 year old male with altered mental status.  EEG to evaluate for seizures.  Level of alertness: Awake, sleep  AEDs during EEG study: None  Technical aspects: This EEG study was done with scalp electrodes positioned according to the 10-20 International system of electrode placement. Electrical activity was acquired at a sampling rate of 500Hz  and reviewed with a high frequency filter of 70Hz  and a low frequency filter of 1Hz . EEG data were recorded continuously and digitally stored.   Description: During awake state,the posterior dominant rhythm consists of 7.5-8 Hz activity of moderate voltage (25-35 uV) seen predominantly in posterior head regions, symmetric and reactive to eye opening and eye closing.  Sleep was characterized by vertex waves, diffuse intermittent rhythmic 2 to 3 Hz delta slowing. Hyperventilation and photic stimulation were not performed.  IMPRESSION: This study is within normal limits. No seizures or epileptiform discharges were seen throughout the recording.  Mark Mathews

## 2019-01-22 NOTE — Progress Notes (Signed)
EEG complete - results pending 

## 2019-01-22 NOTE — Discharge Summary (Signed)
Physician Discharge Summary  Mark Mathews:[id number] DOB: 29-Feb-1941 DOA: 01/15/2019  PCP: Maurice Small, MD  Admit date: 01/15/2019 Discharge date: 01/22/2019  Admitted From: Home Disposition: Home with Home Health PT  Recommendations for Outpatient Follow-up:  1. Follow up with PCP in 1-2 weeks 2. Follow up with Neurology Dr. Frances Furbish within 1-2 weeks 3. Follow up with [facility] within 1-2 weeks 4. Please obtain CMP/CBC, Mag, Phos in one week 5. Please follow up on the following pending results:  Home Health: Yes  Equipment/Devices: None   Discharge Condition: Stable  CODE STATUS: DO NOT RESUSCITATE  Diet recommendation: Heart Healthy Soft Diet   Brief/Interim Summary: Brief Narrative:  This is a 78 year old male with past medical history of Parkinson's, bedbound, chronic indwelling catheter with traumatic hypospadias and recurrent UTI with recent antibiotic use with noted loose stools, sacral decubitus ulcer who presented to the ED 11/13 with altered mental status x1 day. Patient was admitted to stepdown unit for severe sepsis with tachycardia, tachypnea, lactic acidosis 4.3 ->>>1.8, altered mental status, WBC 30. Initially with concern for urinary source versus GI source. Started on broad-spectrum antibiotics with cefepime/vancomycin/Flagyl as well as IV fluid resuscitation. CT abdomen pelvis in ED showed new onset bilateral hydroureteronephrosis down to bladder as well as stercoral colitis and large volume fecal impaction.  Cultures positive for Proteus and antibiotics changed to ceftriaxone monotherapy. Urology was consulted who recommended bilateral PCN if patient is viable long-term and did not recommend Foley catheter change. Manual disimpaction was attempted by nursing but unsuccessful on 11/14. He was started on MiraLAX. Later in the day 11/14 patient noted to have persistently low BP in 80s/30s and given IV fluid bolus.  Transfer to Tele-med floor  11/15.  He was given to smog enemas along with Fleet enema and nursing states that he is improving his bowel regimen however KUB still shows a Large Stool burden.  We will continue aggressive bowel management and continue antibiotics at this time for Proteus bacteremia.   Patient was a little bit more somnolent again yesterday  so we will work this up and obtain a head CT, check an RPR, TSH and vitamin B12 level as well as an ABG and I will discuss the case with Neurology given the patient is on Sinemet and has significant Parkinson's disease  After discussed with Dr. Otelia Limes of neurology he recommended cutting the Sinemet down to 1 tablet 5 times daily.  He also recommended an EEG and an MRI but the MRI was canceled patient woke up in his back to baseline.  Patient actually refused his MRI.  Prior to being discharged he had a bowel movement and was placed on a bowel regimen.  Antibiotics were transitioned to p.o. amoxicillin and he was deemed stable to be discharged as he appeared back to baseline.  Patient's son was updated and he will take the patient for a follow-up appointment with his primary care physician, neurologist as well as urologist in outpatient setting within 1 to 2 weeks as PT and OT have recommended home health.  Discharge Diagnoses:  Principal Problem:   Severe sepsis (HCC) Active Problems:   Pressure injury of skin   Dementia with behavioral disturbance (HCC)   Bacteremia  Acute metabolic encephalopathy secondary to Proteus bacteremia and severe sepsis, likely urinary source with now somnolence, improved -On admission:febrile, tachycardic, tachypneic, altered mental status, lactic acid 4.3.,Covid negative. -Day 8 antibiotics:Initially received vancomycin/cefepime in ED,broadened to cefepime/vancomycin/Flagyl and changed to ceftriaxone monotherapy with resulted blood cultures,  deescalated to ancef 11/16.   We will further de-escalate to p.o. amoxicillin at discharge -WBC  has improved and went from 11.4 -> 8.8 -> 9.3 -Was somnolent yesterday but much more awake today and alert and appears back to baseline baseline -Continue Antibiotics -Discontinued Gabapentin -Replete Magnesium -WBC is improved -Remains somewhat confused and agitated  yesterday he was more sleepy so we will obtain an ABG as well as a head CT without contrast; head CT was unremarkable and showed no acute finding but did show atrophy and chronic small vessel ischemic changes  General Malaise and Somnolence, improved -? Gabapentin vs. Persistent bacteremia (afebrile, no leukocytosis, blood cultures from 11/15 negative) vs. Debility vs. Other: Suspect this is medication induced -Discontinued gabapentin -Repeat blood cultures show no growth to date less than 24 hours -Check RPR, TSH, vitamin B12 level as well as MRI Brain w/o Contrast, EEG, and an ABG -ABG was done showed a pH of 7.447, PCO2 40.6, PO2 of 80.2, bicarbonate level 27.5, O2 saturation is 95.7% on room air, vitamin B12 level was 433, and TSH was 1.385 and RPR was nonreactive -MRI of the brain was ordered with contrast however patient was awake enough and oriented and he refused as he was felt to have it done at [facility] due to his contractures -EEG done and as below and was essentially normal -Discussed with Neurology for further recommendations given the patient was on Sinemet and may need a dose adjustment; Dr. Otelia Limes recommends obtaining an EEG and an MRI and also recommended reducing Sinemet to 1 tablet 5 times a day -Patient's son states that he goes with periods of somnolence and periods of wakefulness; yesterday he is extremely somnolent but today he is wide-awake and he is back to baseline refusing laboratory tests -We will consider palliative care consult if not improving  Bilateral Hydronephrosis -Urology consulted: recommended bilateral PCN if patient is viable long-term which would allow for catheter removal. did not  recommend Foley catheter change. 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 No bowel dilatation or evidence of obstruction. No free air or radiopaque calculi. Osteopenia with degenerative changes of the spine. Hernia repair mesh noted over the pelvis." -Consider contacting GI in a.m. 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 Proteus Mirabilis UTI 11/26/2018 -Plan as above likely has another UTI and Proteus bacteremia -Transition to p.o. amoxicillin  AKI secondary to severe sepsis -Resolved with fluids -BUN/creatinine of 23/0.65  Parkinson'scontinue Sinemet IR to prevent withdrawal.  -seen by SLP and they are recommending a dysphagia 3 diet but today they tried to reevaluate he is too somnolent -Hold remaining home meds including Artane and recommends  to  -We will discuss with neurology for further evaluation and Dr. Otelia Limes recommends reducing a 1-1/2 doses of Sinemet 5 times a day and recommends 1 tablet of Sinemet 5 times a day now and  obtain an MRI and an EEG -MRI was canceled as patient was awake and refused.  EEG was normal -Patient will need to follow-up with his primary neurologist and will discharge home on Sinemet and hold his Artane for now.  Recommended his son follow-up with Dr. Frances Furbish within 1 to 2 weeks  Bilateral lower extremity paresthesias -Resolved with gabapentin the day before yesterday. 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 -Discontinued gabapentin with new somnolence  Nongap Metabolic Acidosis -Secondary to sepsis, AKI and hyperchloremia -Resolved -Patient's CO2 is now  25, chloride is  105, and anion gap is  9 -Continue to monitor and trend CMP  Normocytic Anemia -Patient's hemoglobin/hematocrit has been stable and last value was 10.6/32.9 -Continue monitor trend and check anemia panel outpatient setting and follow-up with PCP within 1 week  Discharge Instructions Discharge Instructions    Call MD for:  difficulty breathing, headache or visual disturbances   Complete by: As directed    Call MD for:  extreme fatigue   Complete by: As directed    Call MD for:  hives   Complete by: As directed    Call MD for:  persistant dizziness or light-headedness   Complete by: As directed    Call MD for:  persistant nausea and vomiting   Complete by: As directed    Call MD for:  redness, tenderness, or signs of infection (pain, swelling, redness, odor or green/yellow discharge around incision site)   Complete by: As directed    Call MD for:  severe uncontrolled pain   Complete by: As directed    Call MD for:  temperature >100.4   Complete by: As directed    Diet - low sodium heart healthy   Complete by: As directed    SOFT DIET   Discharge instructions   Complete by: As directed    You were cared for by a hospitalist during your hospital stay. If you have any questions about your discharge medications or the care you received while you were in the hospital after you are discharged, you  can call the unit and ask to speak with the hospitalist on call if the hospitalist that took care of you is not available. Once you are discharged, your primary care physician will handle any further medical issues. Please note that NO REFILLS for any discharge medications will be authorized once you are discharged, as it is imperative that you return to your primary care physician (or establish a relationship with a primary care physician if you do not have one) for your aftercare needs so that they can reassess your need for medications and monitor your lab values.  Follow up with PCP, Neurology, and Urology. Take all medications as prescribed. If symptoms change or worsen please return to the ED for evaluation   Increase activity slowly   Complete by: As directed      Allergies as of 01/22/2019   No Known Allergies     Medication List    STOP taking these medications   cephALEXin 500 MG capsule Commonly known as: KEFLEX   entacapone 200 MG tablet Commonly known as: Comtan   trihexyphenidyl 2 MG tablet Commonly known as: ARTANE     TAKE these medications   acetaminophen 500 MG tablet Commonly  known as: TYLENOL Take 2 tablets (1,000 mg total) by mouth every 8 (eight) hours as needed. What changed: reasons to take this   carbidopa-levodopa 25-100 MG tablet Commonly known as: SINEMET IR Take 1 tablet by mouth 5 (five) times daily. What changed: See the new instructions.   feeding supplement Liqd Take 1 Container by mouth daily as needed (at patient request). Chocolate   feeding supplement (ENSURE ENLIVE) Liqd Take 237 mLs by mouth 2 (two) times daily between meals.   feeding supplement (PRO-STAT SUGAR FREE 64) Liqd Take 30 mLs by mouth 2 (two) times daily.   lip balm ointment Apply 1 application topically as needed for lip care (cold sores).   mirabegron ER 50 MG Tb24 tablet Commonly known as: MYRBETRIQ Take 1 tablet (50 mg total) by mouth daily. Start taking on:  January 23, 2019   multivitamin with minerals Tabs tablet Take 1 tablet by mouth daily. Start taking on: January 23, 2019   ondansetron 4 MG tablet Commonly known as: ZOFRAN Take 1 tablet (4 mg total) by mouth every 6 (six) hours as needed for nausea.   oxyCODONE 5 MG immediate release tablet Commonly known as: Oxy IR/ROXICODONE Take 1 tablet (5 mg total) by mouth every 6 (six) hours as needed for moderate pain.   polyethylene glycol 17 g packet Commonly known as: MIRALAX / GLYCOLAX Take 17 g by mouth 2 (two) times daily.   polyvinyl alcohol 1.4 % ophthalmic solution Commonly known as: LIQUIFILM TEARS Place 1 drop into both eyes as needed for dry eyes.   senna-docusate 8.6-50 MG tablet Commonly known as: Senokot-S Take 1 tablet by mouth 2 (two) times daily.       No Known Allergies  Consultations:  Urology  Discussed Case with Neurology Dr. Otelia Limes   Procedures/Studies: Dg Abd 1 View  Result Date: 01/21/2019 CLINICAL DATA:  78 year old male with constipation. EXAM: ABDOMEN - 1 VIEW COMPARISON:  CT of the abdomen pelvis dated 01/15/2019. FINDINGS: There is large amount of stool throughout the colon. No bowel dilatation or evidence of obstruction. No free air or radiopaque calculi. Osteopenia with degenerative changes of the spine. Hernia repair mesh noted over the pelvis. IMPRESSION: Constipation. No bowel obstruction. Electronically Signed   By: Elgie Collard M.D.   On: 01/21/2019 09:15   Ct Head Wo Contrast  Result Date: 01/21/2019 CLINICAL DATA:  Unexplained altered level of consciousness today. Somnolent. EXAM: CT HEAD WITHOUT CONTRAST TECHNIQUE: Contiguous axial images were obtained from the base of the skull through the vertex without intravenous contrast. COMPARISON:  02/10/2018 FINDINGS: Brain: Generalized atrophy. Chronic small-vessel ischemic changes of the white matter. No sign of acute infarction, mass lesion, hemorrhage, hydrocephalus or extra-axial  collection. Vascular: No abnormal vascular finding. Skull: Negative Sinuses/Orbits: Clear/normal Other: None IMPRESSION: No acute finding. Atrophy and chronic small-vessel ischemic changes. Electronically Signed   By: Paulina Fusi M.D.   On: 01/21/2019 17:25   Ct Abdomen Pelvis W Contrast  Result Date: 01/15/2019 CLINICAL DATA:  Sepsis. Indwelling catheter and prior urinary tract infection. Diarrhea. EXAM: CT ABDOMEN AND PELVIS WITH CONTRAST TECHNIQUE: Multidetector CT imaging of the abdomen and pelvis was performed using the standard protocol following bolus administration of intravenous contrast. CONTRAST:  OMNIPAQUE IOHEXOL 300 MG/ML  SOLN COMPARISON:  CT 04/02/2018 FINDINGS: Lower chest: Mild bilateral pleural thickening with trace left pleural effusion. Mild basilar atelectasis. Coronary artery calcifications. Partially motion obscured. Hepatobiliary: No focal liver abnormality is seen. No gallstones, gallbladder wall thickening, or biliary dilatation.  Pancreas: Motion obscured.  No ductal dilatation or inflammation. Spleen: Normal in size without focal abnormality. Adrenals/Urinary Tract: No adrenal nodule. Bilateral hydroureteronephrosis, new from prior exam. There is right periureteric stranding and enhancement of the right renal collecting system. Both ureters are dilated to the bladder insertion. Bilateral perinephric edema. Diminished excretion on delayed phase imaging from both kidneys. Foley catheter within the urinary bladder. Diffuse urinary bladder wall thickening, enhancement, and perivesicular edema. Few scattered small bladder diverticula. Stomach/Bowel: Large volume of stool distends the rectum, rectal distention of 8.6 cm. Mild anorectal wall thickening and perirectal edema. No definite pneumatosis. Large volume of stool in the sigmoid colon. Distal descending colon is tortuous and decompressed. Small to moderate volume of stool throughout the remainder the colon. Appendix not  visualized, surgically absent per history. Few fluid-filled small bowel loops without obstruction. Inflammatory changes in the right pericolic gutter, favored to be reactive secondary to adjacent ureteral inflammation. Fluid-filled duodenum. Moderate hiatal hernia with small volume of fluid in the stomach. No obvious source of perforation. Vascular/Lymphatic: Moderate aorto bi-iliac atherosclerosis. Diffuse vascular tortuosity. Retroaortic left renal vein. Patent portal vein. Limited assessment for adenopathy given patient motion. No bulky abdominopelvic lymph nodes. Reproductive: Prostate is unremarkable. Other: Tiny focus of air adjacent to the gallbladder and left lobe of the liver, this abuts the hepatic flexure of the colon is felt to represent a colonic fold, series 2, image 28. Generalized retroperitoneal stranding suggesting urinary tract infection. Prior lower abdominal wall hernia repair with tacks. No evidence of intra-abdominal abscess. Musculoskeletal: Unchanged moderately severe L3 compression fracture. Multilevel degenerative change in the spine. Bones diffusely under mineralized. Improve subcutaneous edema of the posterior soft tissues. Previous soft tissue gas in the retrosacral soft tissues has resolved, there is residual soft tissue thickening. IMPRESSION: 1. Findings consistent with urinary tract infection with cystitis and ascending urinary tract infection on the right and likely left. Inflammatory changes about the right renal collecting system. No renal abscess. 2. Large volume of stool in the distal sigmoid colon and rectum with mild anorectal wall thickening and perirectal/pericolonic edema, suspicious for fecal impaction or stercoral colitis. 3. Tiny focus of air adjacent to the gallbladder is felt to be contiguous with the hepatic flexure of the colon rather than a focus of free air, as there is no evidence of bowel perforation. 4. Mild bilateral pleural thickening with trace left pleural  effusion. Mild basilar atelectasis. Aortic Atherosclerosis (ICD10-I70.0). Electronically Signed   By: Keith Rake M.D.   On: 01/15/2019 20:09   Dg Chest Portable 1 View  Result Date: 01/15/2019 CLINICAL DATA:  Fever, altered mental status EXAM: PORTABLE CHEST 1 VIEW COMPARISON:  August 12, 2017 FINDINGS: Rotated. The extreme left costophrenic angle is excluded. No consolidation or edema. No pleural effusion. Likely normal heart size for portable technique. IMPRESSION: No acute process in the chest. Electronically Signed   By: Macy Mis M.D.   On: 01/15/2019 13:57   EEG read by Dr. Lora Havens Technical aspects: This EEG study was done with scalp electrodes positioned according to the 10-20 International system of electrode placement. Electrical activity was acquired at a sampling rate of 500Hz  and reviewed with a high frequency filter of 70Hz  and a low frequency filter of 1Hz . EEG data were recorded continuously and digitally stored.   Description: During awake state,the posterior dominant rhythm consists of 7.5-8 Hz activity of moderate voltage (25-35 uV) seen predominantly in posterior head regions, symmetric and reactive to eye opening and eye closing.  Sleep was characterized by vertex waves, diffuse intermittent rhythmic 2 to 3 Hz delta slowing. Hyperventilation and photic stimulation were not performed.  IMPRESSION: This study is within normal limits. No seizures or epileptiform discharges were seen throughout the recording.  Subjective: Seen and examined at bedside he is much more awake and alert and he is oriented.  He refuses MRI as it was supposed to be diet:.  He is stable for discharge with home health physical therapy and Occupational Therapy and medications were adjusted.  Son was updated and signed and patient are in agreement with plan to be discharged in understand that he will need to follow-up with PCP, neurology as well as urology in outpatient setting.  Discharge  Exam: Vitals:   01/21/19 2044 01/22/19 0537  BP: 98/61 (!) 110/45  Pulse: 77 73  Resp: 18 18  Temp: 97.7 F (36.5 C) 98.4 F (36.9 C)  SpO2: 95% 97%   Vitals:   01/21/19 0431 01/21/19 1408 01/21/19 2044 01/22/19 0537  BP: (!) 98/58 118/72 98/61 (!) 110/45  Pulse: 73 76 77 73  Resp: 16 18 18 18   Temp: 98.3 F (36.8 C) 99.1 F (37.3 C) 97.7 F (36.5 C) 98.4 F (36.9 C)  TempSrc: Oral Oral Oral Oral  SpO2: 96% 96% 95% 97%  Weight:      Height:       General: Pt is alert, awake, not in acute distress but he is agitated Cardiovascular: RRR, S1/S2 +, no rubs, no gallops Respiratory: Diminished bilaterally, no wheezing, no rhonchi Abdominal: Soft, NT, mildly distended, bowel sounds + Extremities: no edema, no cyanosis; has some contractures  The results of significant diagnostics from this hospitalization (including imaging, microbiology, ancillary and laboratory) are listed below for reference.     Microbiology: Recent Results (from the past 240 hour(s))  Culture, blood (Routine x 2)     Status: Abnormal   Collection Time: 01/15/19 12:00 PM   Specimen: BLOOD  Result Value Ref Range Status   Specimen Description   Final    BLOOD LEFT ANTECUBITAL Performed at [facility], [address], [place], [place] [address]    Special Requests   Final    BOTTLES DRAWN AEROBIC AND ANAEROBIC Blood Culture adequate volume Performed at [facility], [address], [place], [place] [address]    Culture  Setup Time   Final    IN BOTH AEROBIC AND ANAEROBIC BOTTLES GRAM VARIABLE ROD CRITICAL RESULT CALLED TO, READ BACK BY AND VERIFIED WITH: B GREEN PHARMD 01/16/19 0308 JDW    Culture (A)  Final    PROTEUS MIRABILIS SUSCEPTIBILITIES PERFORMED ON PREVIOUS CULTURE WITHIN THE LAST 5 DAYS. Performed at [facility], [address], [place], [place] [address]    Report Status 01/18/2019 FINAL  Final  Blood Culture ID Panel (Reflexed)      Status: Abnormal   Collection Time: 01/15/19 12:00 PM  Result Value Ref Range Status   Enterococcus species NOT DETECTED NOT DETECTED Final   Listeria monocytogenes NOT DETECTED NOT DETECTED Final   Staphylococcus species NOT DETECTED NOT DETECTED Final   Staphylococcus aureus (BCID) NOT DETECTED NOT DETECTED Final   Streptococcus species NOT DETECTED NOT DETECTED Final   Streptococcus agalactiae NOT DETECTED NOT DETECTED Final   Streptococcus pneumoniae NOT DETECTED NOT DETECTED Final   Streptococcus pyogenes NOT DETECTED NOT DETECTED Final   Acinetobacter baumannii NOT DETECTED NOT DETECTED Final   Enterobacteriaceae species DETECTED (A) NOT DETECTED Final  Comment: Enterobacteriaceae represent a large family of gram-negative bacteria, not a single organism. CRITICAL RESULT CALLED TO, READ BACK BY AND VERIFIED WITH: B GREEN PHARMD 01/16/19 0308 JDW    Enterobacter cloacae complex NOT DETECTED NOT DETECTED Final   Escherichia coli NOT DETECTED NOT DETECTED Final   Klebsiella oxytoca NOT DETECTED NOT DETECTED Final   Klebsiella pneumoniae NOT DETECTED NOT DETECTED Final   Proteus species DETECTED (A) NOT DETECTED Final    Comment: CRITICAL RESULT CALLED TO, READ BACK BY AND VERIFIED WITH: B GREEN PHARMD 01/16/19 0308 JDW    Serratia marcescens NOT DETECTED NOT DETECTED Final   Carbapenem resistance NOT DETECTED NOT DETECTED Final   Haemophilus influenzae NOT DETECTED NOT DETECTED Final   Neisseria meningitidis NOT DETECTED NOT DETECTED Final   Pseudomonas aeruginosa NOT DETECTED NOT DETECTED Final   Candida albicans NOT DETECTED NOT DETECTED Final   Candida glabrata NOT DETECTED NOT DETECTED Final   Candida krusei NOT DETECTED NOT DETECTED Final   Candida parapsilosis NOT DETECTED NOT DETECTED Final   Candida tropicalis NOT DETECTED NOT DETECTED Final    Comment: Performed at [facility], [address], [place], [place] [address]  Culture, blood (Routine x 2)     Status:  Abnormal   Collection Time: 01/15/19 12:02 PM   Specimen: BLOOD RIGHT FOREARM  Result Value Ref Range Status   Specimen Description   Final    BLOOD RIGHT FOREARM Performed at [facility], [address], [place], [place] [address]    Special Requests   Final    BOTTLES DRAWN AEROBIC AND ANAEROBIC Blood Culture results may not be optimal due to an excessive volume of blood received in culture bottles Performed at [facility], [address], [place], [place] [address]    Culture  Setup Time   Final    IN BOTH AEROBIC AND ANAEROBIC BOTTLES GRAM VARIABLE ROD CRITICAL VALUE NOTED.  VALUE IS CONSISTENT WITH PREVIOUSLY REPORTED AND CALLED VALUE. Performed at [facility], [address], [place], [place] [address]    Culture PROTEUS MIRABILIS (A)  Final   Report Status 01/18/2019 FINAL  Final   Organism ID, Bacteria PROTEUS MIRABILIS  Final      Susceptibility   Proteus mirabilis - MIC*    AMPICILLIN <=2 SENSITIVE Sensitive     CEFAZOLIN <=4 SENSITIVE Sensitive     CEFEPIME <=1 SENSITIVE Sensitive     CEFTAZIDIME <=1 SENSITIVE Sensitive     CEFTRIAXONE <=1 SENSITIVE Sensitive     CIPROFLOXACIN <=0.25 SENSITIVE Sensitive     GENTAMICIN <=1 SENSITIVE Sensitive     IMIPENEM 2 SENSITIVE Sensitive     TRIMETH/SULFA <=20 SENSITIVE Sensitive     AMPICILLIN/SULBACTAM <=2 SENSITIVE Sensitive     PIP/TAZO <=4 SENSITIVE Sensitive     * PROTEUS MIRABILIS  SARS CORONAVIRUS 2 (TAT 6-24 HRS) Nasopharyngeal Nasopharyngeal Swab     Status: None   Collection Time: 01/15/19 12:31 PM   Specimen: Nasopharyngeal Swab  Result Value Ref Range Status   SARS Coronavirus 2 NEGATIVE NEGATIVE Final    Comment: (NOTE) SARS-CoV-2 target nucleic acids are NOT DETECTED. The SARS-CoV-2 RNA is generally detectable in upper and lower respiratory specimens during the acute phase of infection. Negative results do not preclude SARS-CoV-2 infection, do not rule  out co-infections with other pathogens, and should not be used as the sole basis for treatment or other patient management decisions. Negative results must be combined with clinical  observations, patient history, and epidemiological information. The expected result is Negative. Fact Sheet for Patients: HairSlick.no Fact Sheet for Healthcare Providers: quierodirigir.com This test is not yet approved or cleared by the [place] FDA and  has been authorized for detection and/or diagnosis of SARS-CoV-2 by FDA under an Emergency Use Authorization (EUA). This EUA will remain  in effect (meaning this test can be used) for the duration of the COVID-19 declaration under Section 56 4(b)(1) of the Act, 21 U.S.C. section 360bbb-3(b)(1), unless the authorization is terminated or revoked sooner. Performed at [facility], [address], [place], [place] [address]   Culture, blood (routine x 2)     Status: None   Collection Time: 01/17/19  9:04 AM   Specimen: BLOOD RIGHT HAND  Result Value Ref Range Status   Specimen Description   Final    BLOOD RIGHT HAND Performed at [facility], [address], [place], [place] [address]    Special Requests   Final    BOTTLES DRAWN AEROBIC AND ANAEROBIC Blood Culture adequate volume Performed at [facility], [address], [place], [place] [address]    Culture   Final    NO GROWTH 5 DAYS Performed at [facility], [address], [place], [place] [address]    Report Status 01/22/2019 FINAL  Final  Culture, blood (routine x 2)     Status: None   Collection Time: 01/17/19  9:08 AM   Specimen: BLOOD LEFT HAND  Result Value Ref Range Status   Specimen Description   Final    BLOOD LEFT HAND Performed at [facility], [address] [place], [place] [address]    Special Requests   Final    BOTTLES DRAWN AEROBIC ONLY Blood  Culture adequate volume Performed at [facility], [address] [place], [place] [address]    Culture   Final    NO GROWTH 5 DAYS Performed at [facility], [address], [place], [place] [address]    Report Status 01/22/2019 FINAL  Final  Urine culture     Status: None   Collection Time: 01/17/19  6:56 PM   Specimen: In/Out Cath Urine  Result Value Ref Range Status   Specimen Description   Final    IN/OUT CATH URINE Performed at [facility], [address], [place], [place] [address]    Special Requests   Final    NONE Performed at [facility], [address] [place], [place] [address]    Culture   Final    NO GROWTH Performed at [facility], [address], [place], [place] [address]    Report Status 01/18/2019 FINAL  Final  Culture, blood (routine x 2)     Status: None (Preliminary result)   Collection Time: 01/19/19  1:45 PM   Specimen: BLOOD RIGHT HAND  Result Value Ref Range Status   Specimen Description   Final    BLOOD RIGHT HAND Performed at [facility], [address] [place], [place] [address]    Special Requests   Final    BOTTLES DRAWN AEROBIC AND ANAEROBIC Blood Culture adequate volume Performed at [facility], [address] [place], [place] [address]    Culture   Final    NO GROWTH 3 DAYS Performed at [facility], [address], [place], [place] [address]    Report Status  PENDING  Incomplete  Culture, blood (routine x 2)     Status: None (Preliminary result)   Collection Time: 01/19/19  1:45 PM   Specimen: BLOOD  Result Value Ref Range Status   Specimen Description   Final    BLOOD LEFT ANTECUBITAL Performed at [facility], [address], [place], [place] [address]    Special Requests   Final    BOTTLES DRAWN AEROBIC AND ANAEROBIC Blood Culture adequate volume Performed at [facility], [address], [place], [place] [address]    Culture   Final    NO GROWTH 3 DAYS Performed at [facility], [address], [place], [place] [address]    Report Status PENDING  Incomplete    Labs: BNP (last 3 results) Recent Labs    02/10/18 1310 01/15/19 2052  BNP 62.1 229.9*   Basic Metabolic Panel: Recent Labs  Lab 01/17/19 0230 01/18/19 0644 01/19/19 0716 01/20/19 0536 01/21/19 0607 01/22/19 0553  NA 139 141 137 137 135 139  K 3.1* 4.0 3.7 3.9 3.9 3.9  CL 112* 111 105 104 101 105  CO2 21* 23 25 25 24 25   GLUCOSE 89 89 103* 98 111* 96  BUN 24* 15 12 14 23 23   CREATININE 0.66 0.75 0.77 0.86 0.73 0.65  CALCIUM 7.9* 8.4* 8.5* 8.4* 8.3* 8.3*  MG 2.0  --  1.7  --  2.3 2.1  PHOS  --   --   --   --  3.1 2.6   Liver Function Tests: Recent Labs  Lab 01/16/19 0216 01/20/19 0536 01/21/19 0607 01/22/19 0553  AST 88* 31 37 39  ALT 61* 16 11 13   ALKPHOS 56 63 64 57  BILITOT 0.7 0.2* 0.4 0.4  PROT 6.2* 6.5 6.5 6.3*  ALBUMIN 2.7* 2.7* 2.9* 2.7*   No results for input(s): LIPASE, AMYLASE in the last 168 hours. No results for input(s): AMMONIA in the last 168 hours. CBC: Recent Labs  Lab 01/18/19 0644 01/19/19 0716 01/20/19 0536 01/21/19 0607 01/22/19 0553  WBC 11.4* 9.6 9.0 8.8 9.3  NEUTROABS  --   --   --  4.7 4.7  HGB 10.7* 10.9* 11.2* 11.4* 10.6*  HCT 34.0* 34.7* 34.6* 35.8* 33.9*  MCV 101.5* 99.7 97.7 98.4 99.4  PLT 215 226 265 319 323   Cardiac Enzymes: No results for input(s): CKTOTAL, CKMB, CKMBINDEX, TROPONINI in the last 168 hours. BNP: Invalid input(s): POCBNP CBG: No results for input(s): GLUCAP in the last 168 hours. D-Dimer No results for input(s): DDIMER in the last 72 hours. Hgb A1c No results for input(s): HGBA1C in the last 72 hours. Lipid Profile No results for input(s): CHOL, HDL, LDLCALC, TRIG, CHOLHDL, LDLDIRECT in the last 72 hours. Thyroid function studies Recent Labs    01/21/19 1610  TSH 1.385   Anemia  work up Recent Labs    01/21/19 1610  VITAMINB12 433   Urinalysis    Component Value Date/Time   COLORURINE YELLOW 01/17/2019 1916   APPEARANCEUR TURBID (A) 01/17/2019 1916   LABSPEC 1.015 01/17/2019 1916   PHURINE 6.0 01/17/2019 1916   GLUCOSEU NEGATIVE 01/17/2019 1916   HGBUR LARGE (A) 01/17/2019 1916   BILIRUBINUR NEGATIVE 01/17/2019 1916   KETONESUR 5 (A) 01/17/2019 1916   PROTEINUR 30 (A) 01/17/2019 1916   NITRITE NEGATIVE 01/17/2019 1916   LEUKOCYTESUR LARGE (A) 01/17/2019 1916   Sepsis Labs Invalid input(s): PROCALCITONIN,  WBC,  LACTICIDVEN Microbiology Recent Results (from the  past 240 hour(s))  Culture, blood (Routine x 2)     Status: Abnormal   Collection Time: 01/15/19 12:00 PM   Specimen: BLOOD  Result Value Ref Range Status   Specimen Description   Final    BLOOD LEFT ANTECUBITAL Performed at [facility], [address] [place], [place] [address]    Special Requests   Final    BOTTLES DRAWN AEROBIC AND ANAEROBIC Blood Culture adequate volume Performed at [facility], [address], [place], [place] [address]    Culture  Setup Time   Final    IN BOTH AEROBIC AND ANAEROBIC BOTTLES GRAM VARIABLE ROD CRITICAL RESULT CALLED TO, READ BACK BY AND VERIFIED WITH: B GREEN PHARMD 01/16/19 0308 JDW    Culture (A)  Final    PROTEUS MIRABILIS SUSCEPTIBILITIES PERFORMED ON PREVIOUS CULTURE WITHIN THE LAST 5 DAYS. Performed at [facility], [address], [place], [place] [address]    Report Status 01/18/2019 FINAL  Final  Blood Culture ID Panel (Reflexed)     Status: Abnormal   Collection Time: 01/15/19 12:00 PM  Result Value Ref Range Status   Enterococcus species NOT DETECTED NOT DETECTED Final   Listeria monocytogenes NOT DETECTED NOT DETECTED Final   Staphylococcus species NOT DETECTED NOT DETECTED Final   Staphylococcus aureus (BCID) NOT DETECTED NOT DETECTED Final   Streptococcus species NOT DETECTED NOT  DETECTED Final   Streptococcus agalactiae NOT DETECTED NOT DETECTED Final   Streptococcus pneumoniae NOT DETECTED NOT DETECTED Final   Streptococcus pyogenes NOT DETECTED NOT DETECTED Final   Acinetobacter baumannii NOT DETECTED NOT DETECTED Final   Enterobacteriaceae species DETECTED (A) NOT DETECTED Final    Comment: Enterobacteriaceae represent a large family of gram-negative bacteria, not a single organism. CRITICAL RESULT CALLED TO, READ BACK BY AND VERIFIED WITH: B GREEN PHARMD 01/16/19 0308 JDW    Enterobacter cloacae complex NOT DETECTED NOT DETECTED Final   Escherichia coli NOT DETECTED NOT DETECTED Final   Klebsiella oxytoca NOT DETECTED NOT DETECTED Final   Klebsiella pneumoniae NOT DETECTED NOT DETECTED Final   Proteus species DETECTED (A) NOT DETECTED Final    Comment: CRITICAL RESULT CALLED TO, READ BACK BY AND VERIFIED WITH: B GREEN PHARMD 01/16/19 0308 JDW    Serratia marcescens NOT DETECTED NOT DETECTED Final   Carbapenem resistance NOT DETECTED NOT DETECTED Final   Haemophilus influenzae NOT DETECTED NOT DETECTED Final   Neisseria meningitidis NOT DETECTED NOT DETECTED Final   Pseudomonas aeruginosa NOT DETECTED NOT DETECTED Final   Candida albicans NOT DETECTED NOT DETECTED Final   Candida glabrata NOT DETECTED NOT DETECTED Final   Candida krusei NOT DETECTED NOT DETECTED Final   Candida parapsilosis NOT DETECTED NOT DETECTED Final   Candida tropicalis NOT DETECTED NOT DETECTED Final    Comment: Performed at [facility], [address] [place], [place] [address]  Culture, blood (Routine x 2)     Status: Abnormal   Collection Time: 01/15/19 12:02 PM   Specimen: BLOOD RIGHT FOREARM  Result Value Ref Range Status   Specimen Description   Final    BLOOD RIGHT FOREARM Performed at [facility], [address], [place], [place] [address]    Special Requests   Final    BOTTLES DRAWN AEROBIC AND ANAEROBIC Blood Culture results may not be  optimal due to an excessive volume of blood received in culture bottles Performed at [facility], [address] [place],  [place] [address]    Culture  Setup Time   Final    IN BOTH AEROBIC AND ANAEROBIC BOTTLES GRAM VARIABLE ROD CRITICAL VALUE NOTED.  VALUE IS CONSISTENT WITH PREVIOUSLY REPORTED AND CALLED VALUE. Performed at [facility], [address], [place], [place] [address]    Culture PROTEUS MIRABILIS (A)  Final   Report Status 01/18/2019 FINAL  Final   Organism ID, Bacteria PROTEUS MIRABILIS  Final      Susceptibility   Proteus mirabilis - MIC*    AMPICILLIN <=2 SENSITIVE Sensitive     CEFAZOLIN <=4 SENSITIVE Sensitive     CEFEPIME <=1 SENSITIVE Sensitive     CEFTAZIDIME <=1 SENSITIVE Sensitive     CEFTRIAXONE <=1 SENSITIVE Sensitive     CIPROFLOXACIN <=0.25 SENSITIVE Sensitive     GENTAMICIN <=1 SENSITIVE Sensitive     IMIPENEM 2 SENSITIVE Sensitive     TRIMETH/SULFA <=20 SENSITIVE Sensitive     AMPICILLIN/SULBACTAM <=2 SENSITIVE Sensitive     PIP/TAZO <=4 SENSITIVE Sensitive     * PROTEUS MIRABILIS  SARS CORONAVIRUS 2 (TAT 6-24 HRS) Nasopharyngeal Nasopharyngeal Swab     Status: None   Collection Time: 01/15/19 12:31 PM   Specimen: Nasopharyngeal Swab  Result Value Ref Range Status   SARS Coronavirus 2 NEGATIVE NEGATIVE Final    Comment: (NOTE) SARS-CoV-2 target nucleic acids are NOT DETECTED. The SARS-CoV-2 RNA is generally detectable in upper and lower respiratory specimens during the acute phase of infection. Negative results do not preclude SARS-CoV-2 infection, do not rule out co-infections with other pathogens, and should not be used as the sole basis for treatment or other patient management decisions. Negative results must be combined with clinical observations, patient history, and epidemiological information. The expected result is Negative. Fact Sheet for Patients: HairSlick.no Fact Sheet for  Healthcare Providers: quierodirigir.com This test is not yet approved or cleared by the [place] FDA and  has been authorized for detection and/or diagnosis of SARS-CoV-2 by FDA under an Emergency Use Authorization (EUA). This EUA will remain  in effect (meaning this test can be used) for the duration of the COVID-19 declaration under Section 56 4(b)(1) of the Act, 21 U.S.C. section 360bbb-3(b)(1), unless the authorization is terminated or revoked sooner. Performed at [facility], [address] [place], [place] [address]   Culture, blood (routine x 2)     Status: None   Collection Time: 01/17/19  9:04 AM   Specimen: BLOOD RIGHT HAND  Result Value Ref Range Status   Specimen Description   Final    BLOOD RIGHT HAND Performed at [facility], [address], [place], [place] [address]    Special Requests   Final    BOTTLES DRAWN AEROBIC AND ANAEROBIC Blood Culture adequate volume Performed at [facility], [address] [place], [place] [address]    Culture   Final    NO GROWTH 5 DAYS Performed at [facility], [address] [place], [place] [address]    Report Status 01/22/2019 FINAL  Final  Culture, blood (routine x 2)     Status: None   Collection Time: 01/17/19  9:08 AM   Specimen: BLOOD LEFT HAND  Result Value Ref Range Status   Specimen Description   Final    BLOOD LEFT HAND Performed at [facility], [address], [place], [place] [address]    Special Requests   Final    BOTTLES DRAWN AEROBIC ONLY  Blood Culture adequate volume Performed at [facility], [address] [place], [place] [address]    Culture   Final    NO GROWTH 5 DAYS Performed at [facility], [address] [place], [place] [address]    Report Status 01/22/2019 FINAL  Final  Urine culture     Status: None   Collection Time: 01/17/19  6:56 PM   Specimen: In/Out Cath  Urine  Result Value Ref Range Status   Specimen Description   Final    IN/OUT CATH URINE Performed at [facility], [address] [place], [place] [address]    Special Requests   Final    NONE Performed at [facility], [address] [place], [place] [address]    Culture   Final    NO GROWTH Performed at [facility], [address] [place], [place] [address]    Report Status 01/18/2019 FINAL  Final  Culture, blood (routine x 2)     Status: None (Preliminary result)   Collection Time: 01/19/19  1:45 PM   Specimen: BLOOD RIGHT HAND  Result Value Ref Range Status   Specimen Description   Final    BLOOD RIGHT HAND Performed at [facility], [address] [place], [place] [address]    Special Requests   Final    BOTTLES DRAWN AEROBIC AND ANAEROBIC Blood Culture adequate volume Performed at [facility], [address] [place], [place] [address]    Culture   Final    NO GROWTH 3 DAYS Performed at [facility], [address] [place], [place] [address]    Report Status PENDING  Incomplete  Culture, blood (routine x 2)     Status: None (Preliminary result)   Collection Time: 01/19/19  1:45 PM   Specimen: BLOOD  Result Value Ref Range Status   Specimen Description   Final    BLOOD LEFT ANTECUBITAL Performed at [facility], [address] [place], [place] [address]    Special Requests   Final    BOTTLES DRAWN AEROBIC AND ANAEROBIC Blood Culture adequate volume Performed at [facility], [address] [place], [place] [address]    Culture   Final    NO GROWTH 3 DAYS Performed at [facility], [address] [place], [place] [address]    Report Status PENDING  Incomplete   Time coordinating discharge: 35 minutes  SIGNED:  Merlene Laughtermair Latif Sheikh, DO [facility] 01/22/2019, 2:55 PM Pager is on AMION  If 7PM-7AM, please  contact night-coverage [email or web address] Password [email or web address]

## 2019-01-23 NOTE — Progress Notes (Signed)
Received call from PTAR at [place] that patient was delivered to wrong address. The address on file was the incorrect one. PTAR called patient's son and got the correct address however they stated it will not be covered by insurance. This RN called patient's son and apologized for incident. Patient's son Tarek Cravens stated that the PTAR picked him up form the correct address and he gave the day RN the correct address that the patient needed to be delivered to. The patient's current address is [address]. Confirmed by [name] at [phone]. Needs to be updated in system. Charge RN aware.

## 2019-01-23 NOTE — Progress Notes (Signed)
Patient was discharged via [facility], son call and notified. Patient cleaned up by tech and transported with chronic foley. No IV access. Patient has no c/o. Son notified that all bedtime meds given around 2300. Patient understands has no concerns.

## 2019-01-24 LAB — CULTURE, BLOOD (ROUTINE X 2)
Culture: NO GROWTH
Culture: NO GROWTH
Special Requests: ADEQUATE
Special Requests: ADEQUATE

## 2019-01-25 ENCOUNTER — Telehealth: Payer: Self-pay | Admitting: Internal Medicine

## 2019-01-25 NOTE — Telephone Encounter (Signed)
Called patient's son to schedule a Palliative Consult, no answer - left message with reason for call along with my contact information.

## 2019-02-04 ENCOUNTER — Telehealth: Payer: Self-pay

## 2019-02-04 ENCOUNTER — Telehealth: Payer: Self-pay | Admitting: Internal Medicine

## 2019-02-04 NOTE — Telephone Encounter (Signed)
Called patient's son to schedule the Palliative Consult, no answer - left message with reason for call along with my contact information.  I also called Dr. Altamese Dilling office to see if they had another contact number for the patient and had to leave a message.  I did tell MD office that we have been trying to contact patient since September to schedule the Consult and told them if they could not provide [place] with another contact number that we would cancel this referral.

## 2019-02-04 NOTE — Telephone Encounter (Signed)
Received phone call from [facility] from Dr. Altamese Dilling office. Theadora Rama noted that she had received a phone call from Palliative care reporting that we have not been able to connect with patient. Brandy noted that Dr. Daphene Jaeger had a visit with patient today and that she would contact him to see if he can have the family contact Palliative Care.

## 2019-02-09 ENCOUNTER — Telehealth: Payer: Self-pay | Admitting: Internal Medicine

## 2019-02-09 NOTE — Telephone Encounter (Signed)
Called Dr. Altamese Dilling office and left message letting them know that we have cancelled this referral due to unable to speak with patient and/or family to schedule Consult since 11/19/18.  Left my name and contact information if they had any questions regarding this.

## 2019-02-09 NOTE — Telephone Encounter (Signed)
Created in error

## 2019-02-11 ENCOUNTER — Telehealth: Payer: Self-pay | Admitting: Internal Medicine

## 2019-02-11 NOTE — Telephone Encounter (Signed)
Called patient's daughter-in-law Mammie Lorenzo to schedule Palliative Consult at the request of patient son Marya Amsler, no answer - left message with reason for call along with my contact information.

## 2019-02-11 NOTE — Telephone Encounter (Signed)
Rec'd call from patient's son stating he was returning my call.  I explained to him that we had cancelled the referral out due to we had been unable to speak with the patient or the wife to schedule the Consult.  Son stated that his Mom had just recently died and after the patient was discharged from the hospital the end of Nov. He moved in with him.  Son stated that he had talked with Dr. Daphene Jaeger last week and thought Palliative services would be beneficial for him, so I told him we would back out the discharge date and proceed with Palliative services and he was in agreement with this.  Son requested that I call his wife [name] to schedule the Consult because she is with the patient everyday taking care of him and I agreed to call her to schedule.

## 2019-02-12 ENCOUNTER — Telehealth: Payer: Self-pay | Admitting: Internal Medicine

## 2019-02-12 NOTE — Telephone Encounter (Signed)
Rec'd message that Elmore (daughter-in-law) had returned my call.  Called her back and I have scheduled an In-Person Palliative Consult for 02/18/19 @ 11 AM.

## 2019-02-12 NOTE — Telephone Encounter (Signed)
Called daughter-in-law Rickey Sadowski to schedule Palliative Consult, no answer - left another voicemail requesting a call back to schedule, left my contact information.

## 2019-02-17 NOTE — Progress Notes (Signed)
Dec 16th, 2020 [facility] Palliative Care Consult Note Telephone: [phone]  Fax: [phone]  PATIENT NAME: [facility] DOB: 11-03-1940 MRN: [id number] [address] [place], [place];  PRIMARY CARE PROVIDER:    Annita Brod, MD [address]box [address] [place] [address]  Dr. Frances Furbish (Neurology) Dr Alexia Freestone ([facility])   REFERRING PROVIDER:  Annita Brod, MD  RESPONSIBLE PARTY:   Mark Mathews (daughter-in-law) Primary contact:  [phone]. Mark Mathews (son) [phone]  ASSESSMENT / RECOMMENDATIONS:  1. Advance Care Planning: A. Directives: Forms in EPIC/VYNCA EMR. DNR. MOST: DNR/DNR, Limited Additional Interventions, antibiotics and IVFs if indicated.  B. Goals of Care: Continued improvement of sacral wound. Stay out of the hospital. Get patient OOB; awaiting wheelchair arrival.  2. Symptom Management: A.Pain: Generalized, thought r/t bedbound status. Also at site of sacral wound and LE's d/t contractures.  Managed with Tylenol 1000mg  tid. has Oxycodone available for severe pain, but only rarely utilizes.  B.Constipation: managed with prn Miralax and prunes. Can go as long as 9 days without a BM   -Recommended Senna 1-2 tabs qd to bid, up to 3 tabs bid. Titrate to effect. C. Sacral wound: well managed by D-I-L. Wound bed is approximately 2 inches round; no signs of infection or inflammation. We discussed possible use of PolyMem to simplify dressing changes, but it's a little pricy (insurance won't cover; would be $5/pad out of pocket).  3.Cognitive / Functional status: Patient's cognitive status has markedly improved from when I last saw him 10 months earlier. Son Mark Mathews attributes this to being off many of his sedating medications, with most improvement shown since August. Patient is A & O x 3; some ST memory deficits (thought is was a day after Christmas). He mentioned his birthday was yesterday  (true). Able to follow commands, has a sweet disposition, and is cooperative. His speech is clear and on topic. Due to his Parkinson's disease, he is bedbound (has electric bed) and dependent for hygiene, dressing, and toileting. He has UE tremors. His LEs are severely contracted; patient has very limited ability to extend. On Sinemet 5x/d. If late on dose becomes flushed/increased oral secretions/pooling posterior oral pharynax. He needs assist with meals; son reports patient consumes 100%. His weight is stable over these last 10 months, at 135lbs. At a height of 5'7" his BMi is 20.53kg/m2. Patient has permanent Foley Cath. He complains of some pain at point of insertion.   -recommended use of Lidocaine jelly (OTC) prn to penis tip  4. Family Supports / 09-25-1971 / Coping: Patient's wife [employer] recently deceased (metastatic breast cancer; at [facility] for 2 days before her death) about 4 months ago, just after patient discharged from the hospital. Patient grieves his loss. His biggest source of support is his family with whom he resides. Patient has been living with his son [employer], [name], and 2 very young granddaughters.  Patient has another son who lives near the [place]. [facility] comes to the home every month or so. OT/PT following and there is a HHA that will come to bathe patient, for as long as there is a SN/PT need. Patient is awaiting arrival of a special wheelchair (measured for this on the 15th) that will recline, and with a removable side arm that will permit patient to transfer/slide from chair to bed (or car). Working with supplier [employer] [phone]). Family is on a very limited budget. [phone] mentions that patient's  mirabegron (for bladder spasms) is $150/month. [name] attempted to discontinue, but patient experience pain, and spasms causing leaking around Foley catheter.  -Left message at [facility] (Dr. Adah Salvage) asking for less expensive (if possible)  alternative to mirabegron.  5. Follow up Palliative Care Visit: Thurs 03/31/2018 at noon  I spent 60 minutes providing this consultation from 11am to noon. More than 50% of the time in this consultation was spent coordinating communication.   HISTORY OF PRESENT ILLNESS:  Mark Mathews is a 78 y.o. male with h/o Parkinson's, bedbound status, chronic indwelling catheter with traumatic hypospadias,  recurrent UTIs, sacral decubitus ulcer. I last saw patient for Palliative Care 04/13/2018 at [facility] SN[facility].  11/13-11/21/2020 Hospitalized for urosepsis, bilateral hydroureteronephrosis down to bladder, stercoral colitis with large volume fecal impactition. 1/29-04/09/2018: Hospitalized for sepsis 2/2// infected sacral decubitus (surgical debridement) Palliative Care was asked to help address goals of care.   CODE STATUS: DNR  PPS: 30%  HOSPICE ELIGIBILITY/DIAGNOSIS: TBD  PAST MEDICAL HISTORY:  Past Medical History:  Diagnosis Date  . Other and unspecified hyperlipidemia   . Paralysis agitans ([place])   . Parkinson's disease ([place]) 10/12/2012  . Spinal stenosis     SOCIAL HX:  Social History   Tobacco Use  . Smoking status: Never Smoker  . Smokeless tobacco: Never Used  Substance Use Topics  . Alcohol use: No    Alcohol/week: 0.0 standard drinks    ALLERGIES: No Known Allergies   PERTINENT MEDICATIONS:  Outpatient Encounter Medications as of 02/18/2019  Medication Sig  . acetaminophen (TYLENOL) 500 MG tablet Take 2 tablets (1,000 mg total) by mouth every 8 (eight) hours as needed. (Patient taking differently: Take 1,000 mg by mouth every 8 (eight) hours as needed for moderate pain. )  . carbidopa-levodopa (SINEMET IR) 25-100 MG tablet Take 1 tablet by mouth 5 (five) times daily.  Marland Kitchen lip balm (CARMEX) ointment Apply 1 application topically as needed for lip care (cold sores).  . mirabegron ER (MYRBETRIQ) 50 MG TB24 tablet Take 1 tablet (50 mg total) by mouth daily.  . polyethylene  glycol (MIRALAX / GLYCOLAX) 17 g packet Take 17 g by mouth 2 (two) times daily.  . Multiple Vitamin (MULTIVITAMIN WITH MINERALS) TABS tablet Take 1 tablet by mouth daily.  . ondansetron (ZOFRAN) 4 MG tablet Take 1 tablet (4 mg total) by mouth every 6 (six) hours as needed for nausea. (Patient not taking: Reported on 02/18/2019)  . oxyCODONE (OXY IR/ROXICODONE) 5 MG immediate release tablet Take 1 tablet (5 mg total) by mouth every 6 (six) hours as needed for moderate pain. (Patient not taking: Reported on 02/18/2019)  . polyvinyl alcohol (LIQUIFILM TEARS) 1.4 % ophthalmic solution Place 1 drop into both eyes as needed for dry eyes.  Marland Kitchen senna-docusate (SENOKOT-S) 8.6-50 MG tablet Take 1 tablet by mouth 2 (two) times daily.  . [DISCONTINUED] Amino Acids-Protein Hydrolys (FEEDING SUPPLEMENT, PRO-STAT SUGAR FREE 64,) LIQD Take 30 mLs by mouth 2 (two) times daily. (Patient not taking: Reported on 11/26/2018)  . [DISCONTINUED] amoxicillin (AMOXIL) 500 MG capsule Take 1 capsule (500 mg total) by mouth 3 (three) times daily.  . [DISCONTINUED] feeding supplement (BOOST HIGH PROTEIN) LIQD Take 1 Container by mouth daily as needed (at patient request). Chocolate  . [DISCONTINUED] feeding supplement, ENSURE ENLIVE, (ENSURE ENLIVE) LIQD Take 237 mLs by mouth 2 (two) times daily between meals. (Patient not taking: Reported on 01/15/2019)   No facility-administered encounter medications on file as of 02/18/2019.    PHYSICAL EXAM:  Physical Exam limited to decreased risk of COVID exposure Slender, frail appearing elderly Caucasian male lying on left side. He is alert and conversant. Abdomen: non-distended.  GU: Foley catheter intact.  Extremities: Contractures all extremities. Muscular / adipose wasting. No edema. Skin: Sacral wound 2"round; clean wound bed. Stage 2 pressure injury. Neuro: UE resting tremors  Anselm LisMary P Pricella Gaugh, NP

## 2019-02-18 ENCOUNTER — Other Ambulatory Visit: Payer: Medicare Other | Admitting: Internal Medicine

## 2019-02-18 ENCOUNTER — Encounter: Payer: Self-pay | Admitting: Internal Medicine

## 2019-02-18 ENCOUNTER — Other Ambulatory Visit: Payer: Self-pay

## 2019-02-18 DIAGNOSIS — Z515 Encounter for palliative care: Secondary | ICD-10-CM

## 2019-02-18 DIAGNOSIS — Z7189 Other specified counseling: Secondary | ICD-10-CM

## 2019-02-27 ENCOUNTER — Emergency Department (HOSPITAL_COMMUNITY): Payer: Medicare Other

## 2019-02-27 ENCOUNTER — Emergency Department (HOSPITAL_COMMUNITY)
Admission: EM | Admit: 2019-02-27 | Discharge: 2019-02-28 | Disposition: A | Discharge status: Home / Self Care | Payer: Medicare Other | Attending: Emergency Medicine | Admitting: Emergency Medicine

## 2019-02-27 ENCOUNTER — Other Ambulatory Visit: Payer: Self-pay

## 2019-02-27 DIAGNOSIS — F0281 Dementia in other diseases classified elsewhere with behavioral disturbance: Secondary | ICD-10-CM | POA: Diagnosis not present

## 2019-02-27 DIAGNOSIS — G20 Parkinson's disease: Secondary | ICD-10-CM | POA: Diagnosis not present

## 2019-02-27 DIAGNOSIS — D649 Anemia, unspecified: Secondary | ICD-10-CM | POA: Diagnosis not present

## 2019-02-27 DIAGNOSIS — R319 Hematuria, unspecified: Secondary | ICD-10-CM

## 2019-02-27 DIAGNOSIS — Z20828 Contact with and (suspected) exposure to other viral communicable diseases: Secondary | ICD-10-CM | POA: Diagnosis not present

## 2019-02-27 DIAGNOSIS — R4182 Altered mental status, unspecified: Secondary | ICD-10-CM | POA: Diagnosis present

## 2019-02-27 DIAGNOSIS — N390 Urinary tract infection, site not specified: Secondary | ICD-10-CM | POA: Insufficient documentation

## 2019-02-27 DIAGNOSIS — Z79899 Other long term (current) drug therapy: Secondary | ICD-10-CM | POA: Diagnosis not present

## 2019-02-27 MED ORDER — SODIUM CHLORIDE 0.9 % IV BOLUS
1000.0000 mL | Freq: Once | INTRAVENOUS | Status: AC
  Administered 2019-02-27: 1000 mL via INTRAVENOUS

## 2019-02-27 NOTE — ED Triage Notes (Addendum)
BIB EMS from sons' home that is primary caregiver sent in for more altered than normal and is on antibiotics for UTI.  Foley in place and family reports has not been changed since October. Family also reports diarrhea. Hx of parkinson   Alert at baseline at this time.  99.8 136/74 90 96% RA CBG 134  18 in left hand.

## 2019-02-27 NOTE — ED Provider Notes (Addendum)
[facility]-EMERGENCY DEPT Provider Note   CSN: [id number] Arrival date & time: 02/27/19  2213     History No chief complaint on file.   Mark Mathews is a 78 y.o. male.  Patient has severe Parkinson's disease with a Foley in place.  He has recently been treated with amoxicillin for UTI but no urinalysis was sent.  His son is his caregiver and sent him to the emergency room for evaluation of his UTI  The history is provided by medical records and the EMS personnel. No language interpreter was used.  Weakness Severity:  Mild Onset quality:  Sudden Timing:  Constant Progression:  Waxing and waning Chronicity:  Recurrent Context: not alcohol use   Relieved by:  Nothing Worsened by:  Nothing Ineffective treatments:  None tried Associated symptoms: no abdominal pain, no chest pain, no cough, no diarrhea, no frequency, no headaches and no seizures        Past Medical History:  Diagnosis Date  . Other and unspecified hyperlipidemia   . Paralysis agitans (H[facility])   . Parkinson's disease (H[facility]) 10/12/2012  . Spinal stenosis     Patient Active Problem List   Diagnosis Date Noted  . Bacteremia 01/16/2019  . Palliative care by specialist   . Wound infection   . Dementia with behavioral disturbance (HCC)   . Malnutrition of moderate degree 04/06/2018  . Pressure injury of skin 04/06/2018  . Severe sepsis (HCC) 04/02/2018  . Sacral wound 04/02/2018  . Abdominal tenderness 04/02/2018  . Chronic anemia 04/02/2018  . Goals of care, counseling/discussion   . Palliative care encounter   . Closed compression fracture of L3 lumbar vertebra 04/09/2016  . Parkinson's disease (HCC) 10/12/2012    Past Surgical History:  Procedure Laterality Date  . APPENDECTOMY    . CATARACT EXTRACTION  2016   x3  . INCISION AND DRAINAGE ABSCESS N/A 04/02/2018   Procedure: INCISION AND DRAINAGE debridement of sacral decubitis wound;  Surgeon: Andria MeuseWhite, Christopher M, MD;  Location:  [facility] OR;  Service: General;  Laterality: N/A;  . INGUINAL HERNIA REPAIR Bilateral 03/06/2009  . KIDNEY SURGERY         Family History  Problem Relation Age of Onset  . Heart failure Father     Social History   Tobacco Use  . Smoking status: Never Smoker  . Smokeless tobacco: Never Used  Substance Use Topics  . Alcohol use: No    Alcohol/week: 0.0 standard drinks  . Drug use: No    Home Medications Prior to Admission medications   Medication Sig Start Date End Date Taking? Authorizing Provider  acetaminophen (TYLENOL) 500 MG tablet Take 2 tablets (1,000 mg total) by mouth every 8 (eight) hours as needed. Patient taking differently: Take 1,000 mg by mouth every 8 (eight) hours as needed for moderate pain.  04/09/18   Ghimire, Werner LeanShanker M, MD  carbidopa-levodopa (SINEMET IR) 25-100 MG tablet Take 1 tablet by mouth 5 (five) times daily. 01/22/19   Marguerita MerlesSheikh, Omair Latif, DO  lip balm (CARMEX) ointment Apply 1 application topically as needed for lip care (cold sores). 01/22/19   Sheikh, Omair Latif, DO  mirabegron ER (MYRBETRIQ) 50 MG TB24 tablet Take 1 tablet (50 mg total) by mouth daily. 01/23/19   Marguerita MerlesSheikh, Omair Latif, DO  Multiple Vitamin (MULTIVITAMIN WITH MINERALS) TABS tablet Take 1 tablet by mouth daily. 01/23/19   Sheikh, Omair Latif, DO  ondansetron (ZOFRAN) 4 MG tablet Take 1 tablet (4 mg total) by mouth every  6 (six) hours as needed for nausea. Patient not taking: Reported on 02/18/2019 01/22/19   Raiford Noble Latif, DO  oxyCODONE (OXY IR/ROXICODONE) 5 MG immediate release tablet Take 1 tablet (5 mg total) by mouth every 6 (six) hours as needed for moderate pain. Patient not taking: Reported on 02/18/2019 04/09/18   Jonetta Osgood, MD  polyethylene glycol (MIRALAX / GLYCOLAX) 17 g packet Take 17 g by mouth 2 (two) times daily. 01/22/19   Raiford Noble Latif, DO  polyvinyl alcohol (LIQUIFILM TEARS) 1.4 % ophthalmic solution Place 1 drop into both eyes as needed for dry eyes.     [provider]  senna-docusate (SENOKOT-S) 8.6-50 MG tablet Take 1 tablet by mouth 2 (two) times daily. 01/22/19   Kerney Elbe, DO    Allergies    Patient has no known allergies.  Review of Systems   Review of Systems  Unable to perform ROS: Dementia  Constitutional: Negative for appetite change and fatigue.  HENT: Negative for congestion, ear discharge and sinus pressure.   Eyes: Negative for discharge.  Respiratory: Negative for cough.   Cardiovascular: Negative for chest pain.  Gastrointestinal: Negative for abdominal pain and diarrhea.  Genitourinary: Negative for frequency and hematuria.  Musculoskeletal: Negative for back pain.  Skin: Negative for rash.  Neurological: Positive for weakness. Negative for seizures and headaches.  Psychiatric/Behavioral: Negative for hallucinations.    Physical Exam Updated Vital Signs BP 115/87   Pulse 88   Temp 98.6 F (37 C) (Oral)   Resp 18   SpO2 97%   Physical Exam Vitals and nursing note reviewed.  Constitutional:      Appearance: Normal appearance. He is well-developed.  HENT:     Head: Normocephalic.     Nose: Nose normal.     Mouth/Throat:     Comments: Dry mucous membrane Eyes:     General: No scleral icterus.    Conjunctiva/sclera: Conjunctivae normal.  Neck:     Thyroid: No thyromegaly.  Cardiovascular:     Rate and Rhythm: Normal rate and regular rhythm.     Heart sounds: No murmur. No friction rub. No gallop.   Pulmonary:     Breath sounds: No stridor. No wheezing or rales.  Chest:     Chest wall: No tenderness.  Abdominal:     General: There is no distension.     Tenderness: There is no abdominal tenderness. There is no rebound.  Genitourinary:    Comments: Foley catheter in place.  Urine looks very concentrated Musculoskeletal:        General: No tenderness.     Cervical back: Neck supple.  Lymphadenopathy:     Cervical: No cervical adenopathy.  Skin:    Findings: No erythema or  rash.  Neurological:     Mental Status: He is alert.     Motor: No abnormal muscle tone.     Coordination: Coordination normal.     Comments: Patient alert oriented to person     ED Results / Procedures / Treatments   Labs (all labs ordered are listed, but only abnormal results are displayed) Labs Reviewed  CBC WITH DIFFERENTIAL/PLATELET  COMPREHENSIVE METABOLIC PANEL  URINALYSIS, ROUTINE W REFLEX MICROSCOPIC  LACTIC ACID, PLASMA  POC SARS CORONAVIRUS 2 AG -  ED    EKG None  Radiology No results found.  Procedures Procedures (including critical care time)  Medications Ordered in ED Medications  sodium chloride 0.9 % bolus 1,000 mL (has no administration in time range)  ED Course  I have reviewed the triage vital signs and the nursing notes.  Pertinent labs & imaging results that were available during my care of the patient were reviewed by me and considered in my medical decision making (see chart for details).    MDM Rules/Calculators/A&P                      Urinalysis shows UTI.  Patient is sent home with antibiotics for urinary tract infection.  Care was finished by Dr. Preston Fleeting Final Clinical Impression(s) / ED Diagnoses Final diagnoses:  None    Rx / DC Orders ED Discharge Orders    None       Bethann Berkshire, MD 03/10/19 1041    Bethann Berkshire, MD 03/10/19 1124

## 2019-02-28 ENCOUNTER — Encounter (HOSPITAL_COMMUNITY): Payer: Self-pay

## 2019-02-28 LAB — URINALYSIS, ROUTINE W REFLEX MICROSCOPIC
Bilirubin Urine: NEGATIVE
Glucose, UA: NEGATIVE mg/dL
Ketones, ur: NEGATIVE mg/dL
Nitrite: POSITIVE — AB
Protein, ur: 100 mg/dL — AB
RBC / HPF: >50 RBC/hpf — ABNORMAL HIGH (ref 0–5)
Specific Gravity, Urine: 1.025 (ref 1.005–1.030)
WBC, UA: >50 WBC/hpf — ABNORMAL HIGH (ref 0–5)
pH: 7 (ref 5.0–8.0)

## 2019-02-28 LAB — COMPREHENSIVE METABOLIC PANEL
ALT: 10 U/L (ref 0–44)
AST: 15 U/L (ref 15–41)
Albumin: 3.5 g/dL (ref 3.5–5.0)
Alkaline Phosphatase: 48 U/L (ref 38–126)
Anion gap: 7 (ref 5–15)
BUN: 28 mg/dL — ABNORMAL HIGH (ref 8–23)
CO2: 22 mmol/L (ref 22–32)
Calcium: 8.7 mg/dL — ABNORMAL LOW (ref 8.9–10.3)
Chloride: 108 mmol/L (ref 98–111)
Creatinine, Ser: 0.62 mg/dL (ref 0.61–1.24)
GFR calc Af Amer: >60 mL/min (ref >60)
GFR calc non Af Amer: >60 mL/min (ref >60)
Glucose, Bld: 103 mg/dL — ABNORMAL HIGH (ref 70–99)
Potassium: 3.4 mmol/L — ABNORMAL LOW (ref 3.5–5.1)
Sodium: 137 mmol/L (ref 135–145)
Total Bilirubin: 0.5 mg/dL (ref 0.3–1.2)
Total Protein: 7.6 g/dL (ref 6.5–8.1)

## 2019-02-28 LAB — CBC WITH DIFFERENTIAL/PLATELET
Abs Immature Granulocytes: 0.1 10*3/uL — ABNORMAL HIGH (ref 0.00–0.07)
Basophils Absolute: 0.1 10*3/uL (ref 0.0–0.1)
Basophils Relative: 1 %
Eosinophils Absolute: 0.3 10*3/uL (ref 0.0–0.5)
Eosinophils Relative: 3 %
HCT: 35.3 % — ABNORMAL LOW (ref 39.0–52.0)
Hemoglobin: 11.3 g/dL — ABNORMAL LOW (ref 13.0–17.0)
Immature Granulocytes: 1 %
Lymphocytes Relative: 25 %
Lymphs Abs: 2.5 10*3/uL (ref 0.7–4.0)
MCH: 31.6 pg (ref 26.0–34.0)
MCHC: 32 g/dL (ref 30.0–36.0)
MCV: 98.6 fL (ref 80.0–100.0)
Monocytes Absolute: 1 10*3/uL (ref 0.1–1.0)
Monocytes Relative: 10 %
Neutro Abs: 6.1 10*3/uL (ref 1.7–7.7)
Neutrophils Relative %: 60 %
Platelets: 338 10*3/uL (ref 150–400)
RBC: 3.58 MIL/uL — ABNORMAL LOW (ref 4.22–5.81)
RDW: 13.1 % (ref 11.5–15.5)
WBC: 10.2 10*3/uL (ref 4.0–10.5)
nRBC: 0 % (ref 0.0–0.2)

## 2019-02-28 LAB — LACTIC ACID, PLASMA: Lactic Acid, Venous: 0.8 mmol/L (ref 0.5–1.9)

## 2019-02-28 LAB — POC SARS CORONAVIRUS 2 AG -  ED: SARS Coronavirus 2 Ag: NEGATIVE

## 2019-02-28 MED ORDER — MORPHINE SULFATE (PF) 4 MG/ML IV SOLN
4.0000 mg | Freq: Once | INTRAVENOUS | Status: AC
  Administered 2019-02-28: 4 mg via INTRAVENOUS
  Filled 2019-02-28: qty 1

## 2019-02-28 MED ORDER — SODIUM CHLORIDE 0.9 % IV SOLN
1.0000 g | Freq: Once | INTRAVENOUS | Status: AC
  Administered 2019-02-28: 1 g via INTRAVENOUS
  Filled 2019-02-28: qty 10

## 2019-02-28 MED ORDER — CEPHALEXIN 500 MG PO CAPS: 500.0000 mg | ORAL_CAPSULE | Freq: Three times a day (TID) | ORAL | 0 refills | Status: DC

## 2019-02-28 NOTE — ED Provider Notes (Signed)
Care assumed from Dr. Roderic Palau, patient with possible UTI pending labs.  Labs show a mild anemia which is actually improved over baseline, normal WBC and differential, normal renal function.  Urinalysis does show evidence of if UTI and specimen has been sent for culture.  He is given a dose of ceftriaxone and is discharged with prescription for cephalexin.   Delora Fuel, MD 19/14/78 [phone]

## 2019-02-28 NOTE — Discharge Instructions (Addendum)
Drink plenty of fluids.  Return to the Emergency Department if you start running a fever, start vomiting, or if you seem to be getting worse in any way.

## 2019-02-28 NOTE — ED Notes (Signed)
Spoke to son regarding pt discharge. Provided son discharge instructions and explained explained care provided at ED. Son advised pt transported by [facility] due to worssening parkinson and not able to transport in car.

## 2019-02-28 NOTE — ED Notes (Signed)
Paperwork printed.

## 2019-03-01 LAB — URINE CULTURE

## 2019-04-01 ENCOUNTER — Other Ambulatory Visit: Payer: Medicare Other | Admitting: Internal Medicine

## 2019-04-01 ENCOUNTER — Other Ambulatory Visit: Payer: Self-pay

## 2019-04-01 DIAGNOSIS — Z515 Encounter for palliative care: Secondary | ICD-10-CM

## 2019-04-01 DIAGNOSIS — Z7189 Other specified counseling: Secondary | ICD-10-CM

## 2019-04-01 NOTE — Progress Notes (Signed)
Jan 28th, 2020 [facility] Consult Note Telephone: [phone]  Fax: [phone]   PAT[facility]: [facility] DOB: 11-Sep-1940 MRN: [id number] [address] [place], [place] [address]   PRIMARY CARE PROVIDER:    Clovia Cuff, MD [place] [address]   Dr. Star Age (Neurology) [phone] Dr Adah Salvage ([facility])    REFERRING PROVIDER:  Clovia Cuff, MD   RESPONSIBLE PARTY:   Armarion Greek (daughter-in-law) Primary contact:  [phone]. Abdulah Iqbal (son) [phone]   [employer] (for wheelchair) [phone]  ASSESSMENT / RECOMMENDATIONS:  1. Advance Care Planning: A. Directives: Forms in EPIC/VYNCA EMR. DNR. MOST: DNR/DNR, Limited Additional Interventions, antibiotics and IVFs if indicated.  B. Goals of Care: Continued improvement of sacral wound. Stay out of the hospital. Get patient OOB; awaiting wheelchair arrival.    2. Symptom Management: A. Pain: Continues generalized, thought r/t bedbound status, LE contractures, and stie of sacral wound. Managed with Tylenol 1000mg  tid. [name] has Oxycodone available for severe pain, but only rarely utilizes. They are asking for other suggestions. -Suggested OTC Ibuprofen 200mg ; follow package instructions (1 cap q 4- 6 hours prn pain, no more than 6 caps/24 hr)  B. Constipation: Resolved. No further need for prn laxatives.  C. Sacral wound: [name] reports wound is significantly improved; almost healed. [facility] SN dsg changes q wk, with [name] changing otherwise.   D. Increased LE stiffness; patient asking about possible adjustment upwards of his Sinemet.  -[name]  plans to call Dr. Guadelupe Sabin office to schedule telehealth visit; possible medication adjustment for increased stiffness.  E. Bed bound status: Patient and family frustrated regarding slowness in response to getting Wheelchair. I called [employer]  [phone]). The rep mentioned that patient had been measured for the chair by their staff on Dec 2nd. It can take a few days to 3 months for Medicare approval. Once that is received, they can proceed to order parts/construct chair. Chair will recline, and with a removable side arm that will permit patient to transfer/slide from chair to bed (or car).  F. Urinary incontinence: [name] discontinued patient's foley, and he has been able to urinate good amounts. He is incontinent, and it distresses him. Unfortunately his urinary opening is a the posterior base of his penis (hypospadias), so condom cath placement not possible. Patient's Mirabegron was changed to Tolterodine 4mg  qd to decrease out of pocket cost; saved $60/month.                     3.Cognitive / Functional status: Patient continues A & O x 3.; some ST memory deficits Able to follow commands, has a sweet disposition, and is cooperative. His speech is clear and on topic. Due to his Parkinson's disease, he is bedbound (has electric bed) and dependent for hygiene, dressing, and toileting. He has UE tremors. His LEs are severely contracted; patient has very limited ability to extend. On Sinemet 5x/d. If late on dose becomes flushed/increased oral secretions/pooling posterior oral pharynax. He needs assist with meals; consumes 100%. His weight is stable at 135lbs. At a height of 5'7" his BMi is 20.53kg/m2.    4. Family Supports / [employer] / Coping: [name] deceased (metastatic breast cancer; at [employer] for 2 days before her death) about 5 months ago, just after patient discharged from the hospital. Patient's biggest source of support is his family with whom he resides. Patient has  been living with his son [name], [name], and 2 very young granddaughters.  Patient has another son who lives near the [place]. [facility] comes to the home every month or so. D-I-L wondering about possible methods of  transportation to/from Dr. visits.   -I'll ask our Palliative Care SW [name] if she could provide some names of available Community transportation services.  5. Follow up Palliative Care Visit: Thurs Thurs 05/20/2019; call day before to confirm.   I spent 60 minutes providing this consultation from 11am to noon. More than 50% of the time in this consultation was spent coordinating communication.    HISTORY OF PRESENT ILLNESS:  Mark Mathews is a 79 y.o. male with h/o Parkinson's, bedbound status, chronic indwelling catheter with traumatic hypospadias,  recurrent UTIs, sacral decubitus ulcer. I last saw patient for Palliative Care 04/13/2018 at [facility].  11/13-11/21/2020 Hospitalized for urosepsis, bilateral hydroureteronephrosis down to bladder, stercoral colitis with large volume fecal impactition. 1/29-04/09/2018: Hospitalized for sepsis 2/2// infected sacral decubitus (surgical debridement) This is a f/u Palliative Care visit from 02/17/2019.    CODE STATUS: DNR   PPS: 30%   HOSPICE ELIGIBILITY/DIAGNOSIS: TBD    PAST MEDICAL HISTORY:  Past Medical History:  Diagnosis Date  . Other and unspecified hyperlipidemia   . Paralysis agitans (HCC)   . Parkinson's disease (H[facility]) 10/12/2012  . Spinal stenosis     SOCIAL HX:  Social History   Tobacco Use  . Smoking status: Never Smoker  . Smokeless tobacco: Never Used  Substance Use Topics  . Alcohol use: No    Alcohol/week: 0.0 standard drinks    ALLERGIES: No Known Allergies   PERTINENT MEDICATIONS:  Outpatient Encounter Medications as of 04/01/2019  Medication Sig  . acetaminophen (TYLENOL) 500 MG tablet Take 2 tablets (1,000 mg total) by mouth every 8 (eight) hours as needed. (Patient taking differently: Take 1,000 mg by mouth every 8 (eight) hours as needed for moderate pain. )  . amoxicillin-clavulanate (AUGMENTIN) 875-125 MG tablet Take 1 tablet by mouth 2 (two) times daily.   . baclofen (LIORESAL) 10 MG tablet Take  10 mg by mouth 3 (three) times daily.  . carbidopa-levodopa (SINEMET IR) 25-100 MG tablet Take 1 tablet by mouth 5 (five) times daily.  . cephALEXin (KEFLEX) 500 MG capsule Take 1 capsule (500 mg total) by mouth 3 (three) times daily.  . mirabegron ER (MYRBETRIQ) 50 MG TB24 tablet Take 1 tablet (50 mg total) by mouth daily.  Marland Kitchen oxyCODONE (OXY IR/ROXICODONE) 5 MG immediate release tablet Take 1 tablet (5 mg total) by mouth every 6 (six) hours as needed for moderate pain.  . polyethylene glycol (MIRALAX / GLYCOLAX) 17 g packet Take 17 g by mouth 2 (two) times daily. (Patient taking differently: Take 17 g by mouth daily as needed for moderate constipation. )  . vitamin E 400 UNIT capsule Take 400 Units by mouth daily.   No facility-administered encounter medications on file as of 04/01/2019.    PHYSICAL EXAM:   Physical Exam limited to decreased risk of COVID exposure Slender, frail appearing elderly Caucasian male lying on left side. He is alert and conversant. D-I-L present  Abdomen: non-distended.  GU: hydrospadis  Extremities: Contractures all extremities. Muscular / adipose wasting. No edema. Skin: Sacral Stage 2 pressure injury; improving Neuro: UE resting tremors  Anselm Lis, NP

## 2019-04-02 ENCOUNTER — Encounter: Payer: Self-pay | Admitting: Internal Medicine

## 2019-04-23 ENCOUNTER — Other Ambulatory Visit: Payer: Self-pay | Admitting: Urology

## 2019-05-04 ENCOUNTER — Encounter (HOSPITAL_COMMUNITY): Payer: Self-pay | Admitting: Urology

## 2019-05-04 ENCOUNTER — Other Ambulatory Visit: Payer: Self-pay

## 2019-05-06 NOTE — H&P (Signed)
CC/HPI: 12/22/18: The patient is seen for "penile lesions" however no notes were supplied describing the lesions. I do find that he was seen in the emergency room on 11/26/18 with an indication that he has a Foley catheter in place and has severe Parkinson's with contractures. At that time a home health nurse removed his Foley catheter but was unable to place a new Foley catheter "through another hole at the base of his penis". Dr. Alyson Ingles was contacted and recommended placing a Foley through the base of the penis which apparently was successful.  He came in with his son who supplied all of the history. I have learned that he has been bed ridden severe Parkinson's and contractures for 9 months now. Had a Foley catheter indwelling but I was told that apparently at 1 point the catheter was placed and there was some form of injury that may have occurred. He then developed a knot that then burst. When he was seen in the emergency room Foley catheter was inserted and it has been draining but the urine is cloudy. He is not having any fever and has no hematuria.     ALLERGIES: None   MEDICATIONS: Acetaminophen  Carbidopa-Levodopa  Oxycodone-Acetaminophen 5 mg-325 mg tablet  Trihexyphenidyl Hcl 2 mg tablet     GU PSH: No GU PSH    NON-GU PSH: No Non-GU PSH    GU PMH: No GU PMH    NON-GU PMH: No Non-GU PMH    FAMILY HISTORY: father deceased - Other Kidney Stones - Son mother deceased - Other   SOCIAL HISTORY: Marital Status: Widowed Current Smoking Status: Patient has never smoked.   Tobacco Use Assessment Completed: Used Tobacco in last 30 days? Has never drank.  Drinks 1 caffeinated drink per day.    REVIEW OF SYSTEMS:    GU Review Male:   Patient reports frequent urination, hard to postpone urination, burning/ pain with urination, get up at night to urinate, and leakage of urine. Patient denies stream starts and stops, trouble starting your stream, have to strain to urinate , erection  problems, and penile pain.  Gastrointestinal (Upper):   Patient denies nausea, vomiting, and indigestion/ heartburn.  Gastrointestinal (Lower):   Patient denies diarrhea and constipation.  Constitutional:   Patient reports night sweats and fatigue. Patient denies fever and weight loss.  Skin:   Patient reports skin rash/ lesion and itching.   Eyes:   Patient denies blurred vision and double vision.  Ears/ Nose/ Throat:   Patient denies sore throat and sinus problems.  Hematologic/Lymphatic:   Patient denies swollen glands and easy bruising.  Cardiovascular:   Patient denies leg swelling and chest pains.  Respiratory:   Patient denies cough and shortness of breath.  Endocrine:   Patient denies excessive thirst.  Musculoskeletal:   Patient reports joint pain. Patient denies back pain.  Neurological:   Patient denies headaches and dizziness.  Psychologic:   Patient denies depression and anxiety.   Notes: UTI    VITAL SIGNS:    BP 98/63 mmHg  Pulse 75 /min  Temperature 97.7 F / 36.5 C   GU PHYSICAL EXAMINATION:    Scrotum: No lesions. No edema. No cysts. No warts.  Epididymides: Right: no spermatocele, no masses, no cysts, no tenderness, no induration, no enlargement. Left: no spermatocele, no masses, no cysts, no tenderness, no induration, no enlargement.  Testes: No tenderness, no swelling, no enlargement left testes. No tenderness, no swelling, no enlargement right testes. Normal location left testes.  Normal location right testes. No mass, no cyst, no varicocele, no hydrocele left testes. No mass, no cyst, no varicocele, no hydrocele right testes.  Urethral Meatus: Normal size. No lesion, no wart, no discharge, no polyp. Normal location.  Penis: Circumcised, his Foley catheter enters in an opening at the base of the penis at the penoscrotal junction consistent with a urethrocutaneous fistula.   MULTI-SYSTEM PHYSICAL EXAMINATION:    Constitutional: Well-nourished. No physical  deformities. Normally developed. Good grooming.   Neck: Neck symmetrical, not swollen. Normal tracheal position.  Respiratory: No labored breathing, no use of accessory muscles.   Cardiovascular: Normal temperature, normal extremity pulses, no swelling, no varicosities.  Lymphatic: No enlargement of neck, axillae, groin.  Skin: No paleness, no jaundice, no cyanosis. No lesion, no ulcer, no rash.  Neurologic / Psychiatric: Non communicative  Gastrointestinal: No mass, no tenderness, no rigidity, non obese abdomen.  Eyes: Normal conjunctivae. Normal eyelids.  Ears, Nose, Mouth, and Throat: Left ear no scars, no lesions, no masses. Right ear no scars, no lesions, no masses. Nose no scars, no lesions, no masses. Normal hearing. Normal lips.  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 Abdomen/Pelvis: Reviewed Report. A CT scan performed on 04/02/18 revealed a questionable left renal cyst that was sub cm in size with no other abnormality noted.   Notes:                     A creatinine on 11/26/18 was noted to be 0.89. A urine culture at that time grew Proteus.   Source     : URINE CULTURE  Iso/Result : 01   >100,000 C/ML   Pseudomonas aeruginosa  Antimicrobic/Dose                        MIC   SYSTEMIC   URINE --------------------------------------------------------------- Amikacin                                 <16       S        S  Aztreonam                                 16       I        I  Ceftazidime                                4       S        S  Ciprofloxacin                             <1       S        S  Cefepime                                   4  S        S  Gentamicin                                 8       I         I  Levofloxacin                              <2       S        S  Meropenem                                  2       S        S  Pip/Tazo                                 <16       S        S  Tobramycin                                <4       S        S  ASSESSMENT/PLAN:      ICD-10 Details  1 GU:   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 His son will contact me with a decision as to how he would like to proceed regarding an SP tube.          Notes:   I discussed with his son the fact that it appears he likely developed some form of infection that then resulted in a urethrocutaneous fistula at the base of the penis. He currently has a Foley catheter in this location and the son indicates that when a catheter is placed per urethra it comes out through this hole. I told him it could be redirected but it is probably easier just to place a catheter through the fistula. He wanted to know if it could be repaired and I told him that would be very difficult due to the fact that he is contracted severely and getting to that location for surgery would be quite challenging if not impossible and in addition this fistula is not causing him any health issues. Apparently the home health nurses are reluctant to change the catheter at this location although I told him that it is no different than placing a catheter through his meatus.  His catheter was changed today and then we discussed the fact that I think he would be best served by placement of a suprapubic tube. This would bring the location where the tube was changed up on to his abdomen where a could be easily accessed. We did discuss procedure, its outpatient nature and the fact that it would require a general anesthetic. His son and brother are his healthcare power of going to discuss this further and make a decision as to whether they would want me to consider placing of suprapubic tube. That was to occur he would need to have a urine  culture done preoperatively and treated with antibiotics. He will let me know decision regarding this.   We also discussed  the concept of bacterial colonization versus infection. While his urine is cloudy he is not having any fever or hematuria. Mental status changes are difficult to assess due to his severe, end-stage Parkinson's disease. I have explained the concept of bacterial colonization and the fact that treating cloudy urine in the absence of symptoms of infection would likely result in the development of multiply resistant bacteria and therefore his inadvisable.   A preoperative culture was performed and grew Pseudomonas that was sensitive to Levaquin.  He was placed on this and has remained on antibiotics until the day of his surgery.

## 2019-05-06 NOTE — Discharge Instructions (Signed)
Suprapubic Catheter Home Guide °A suprapubic catheter is a flexible tube that is used to drain urine from the bladder into a collection bag outside the body. The catheter is inserted into the bladder through a small opening in the lower abdomen, above the pubic bone (suprapubic area) and a few inches below your belly button (navel). A tiny balloon filled with germ-free (sterile) water helps to keep the catheter in place. °The collection bag must be emptied at least once a day and cleaned at least every other day. The collection bag can be put beside your bed at night and attached to your leg during the day. You may have a large collection bag to use at night and a smaller one to use during the day. °Your suprapubic catheter may need to be changed every 4-6 weeks, or as often as recommended by your health care provider. Healing of the tract where the catheter is placed can take 6 weeks to 6 months. During that time, your health care provider may change your catheter. Once the tract is well healed, you or a caregiver will change your suprapubic catheter at home. °What are the risks? °This catheter is safe to use. However, problems can occur, including: °· Blocked urine flow. This can occur if the catheter stops working, or if you have a blood clot in your bladder or in the catheter. °· Irritation of the skin around the catheter. °· Infection. This can happen if bacteria gets into your bladder. °Supplies needed: °· Two pairs of sterile gloves. °· Paper towels. °· Catheter. °· Two syringes. °· Sterile water. °· Sterile cleaning solution. °· Lubricant. °· Collection bags. °How to change the catheter ° °1. Drink plenty of fluids during the hours before you change the catheter. °2. Wash your hands with soap and water. If soap and water are not available, use hand sanitizer. °3. Draw up sterile water into a syringe to have ready to fill the new catheter balloon. The amount will depend on the size of the balloon. °4. Have  all of your supplies ready and close to you on a paper towel. °5. Lie on your back, sitting slightly upright so that you can see the catheter and opening. °6. Put on sterile gloves. °7. Clean the skin around the catheter opening using the sterile cleaning solution. °8. Remove the water from the balloon in the catheter using a syringe. °9. Slowly remove the catheter. If the catheter seems stuck, or if you have difficulty removing it: °? Do not pull on it. °? Call your health care provider right away. °10. Place the old catheter on a paper towel to discard later. °11. Take off the used gloves, and put on a new pair. °12. Put lubricant on the end of the new catheter that will go into your bladder. °13. Clean the skin around the catheter opening using the sterile cleaning solution. °14. Gently slide the catheter through the opening in your abdomen and into the tract that leads to your bladder. °15. Wait for some urine to start flowing through the catheter. °16. When urine starts to flow through the catheter, attach the collection bag to the end of the catheter. Make sure the connection is tight. °17. Use a syringe to fill the catheter balloon with sterile water. Fill to the amount directed by your health care provider. °18. Remove the gloves and wash your hands with soap and water. °How to care for the skin around the catheter °Follow your health care provider's instructions on   caring for your skin. °· Use a clean washcloth and soapy water to clean the skin around your catheter every day. Pat the area dry with a clean paper towel. °· Do not pull on the catheter. °· Do not use ointment or lotion on this area, unless told by your health care provider. °· Check the skin around the catheter every day for signs of infection. Check for: °? Redness, swelling, or pain. °? Fluid or blood. °? Warmth. °? Pus or a bad smell. °How to empty and clean the collection bag °Empty the large collection bag every 8 hours. Empty the small  collection bag when it is about ? full. Clean the collection bag every 2-3 days, or as often as told by your health care provider. To do this: °1. Wash your hands with soap and water. If soap and water are not available, use hand sanitizer. °2. Disconnect the bag from the catheter and immediately attach a new bag to the catheter. °3. Hold the used bag over the toilet or another container. °4. Turn the valve (spigot) at the bottom of the bag to empty the urine. Empty the used bag completely. °? Do not touch the opening of the spigot. °? Do not let the opening touch the toilet or container. °5. Close the spigot tightly when the bag is empty. °6. Clean the used bag in one of the following methods: °? According to the manufacturer's instructions. °? As told by your health care provider. °7. Let the bag dry completely. Put it in a clean plastic bag before storing it. °General tips ° °· Always wash your hands before and after caring for your catheter and collection bag. Use a mild, fragrance-free soap. If soap and water are not available, use hand sanitizer. °· Clean the outside of the catheter with soap and water as often as told by your health care provider. °· Always make sure there are no twists or kinks in the catheter tube. °· Always make sure there are no leaks in the catheter or collection bag. °· Always wear the leg bag below your knee. °· Make sure the overnight drainage bag is always lower than the level of your bladder, but do not let it touch the floor. Before you go to sleep, hang the bag inside a wastebasket that is covered by a clean plastic bag. °· Drink enough fluid to keep your urine pale yellow. °· Do not take baths, swim, or use a hot tub until your health care provider approves. Ask your health care provider if you may take showers. °Contact a heath care provider if: °· You leak urine. °· You have redness, swelling, or pain around your catheter. °· You have fluid or blood coming from your catheter  opening. °· Your catheter opening feels warm to the touch. °· You have pus or a bad smell coming from your catheter opening. °· You have a fever or chills. °· Your urine flow slows down. °· Your urine becomes cloudy or smelly. °Get help right away if: °· Your catheter comes out. °· You have: °? Nausea. °? Back pain. °? Difficulty changing your catheter. °? Blood in your urine. °? No urine flow for 1 hour. °Summary °· A suprapubic catheter is a flexible tube that is used to drain urine from the bladder into a collection bag outside the body. °· Your suprapubic catheter may need to be changed every 4-6 weeks, or as recommended by your health care provider. °· Follow instructions on how to   change the catheter and how to empty and clean the collection bag. °· Always wash your hands before and after caring for your catheter and collection bag. Drink enough fluid to keep your urine pale yellow. °· Get help right away if you have difficulty changing your catheter or if there is blood in your urine. °This information is not intended to replace advice given to you by your health care provider. Make sure you discuss any questions you have with your health care provider. °Document Revised: 06/11/2018 Document Reviewed: 03/25/2018 °Elsevier Patient Education © 2020 [employer] ° °

## 2019-05-10 ENCOUNTER — Ambulatory Visit (HOSPITAL_COMMUNITY)
Admission: RE | Admit: 2019-05-10 | Discharge: 2019-05-10 | Disposition: A | Discharge status: Home / Self Care | Payer: Medicare Other | Attending: Urology | Admitting: Urology

## 2019-05-10 ENCOUNTER — Ambulatory Visit (HOSPITAL_COMMUNITY): Payer: Medicare Other | Admitting: Certified Registered Nurse Anesthetist

## 2019-05-10 ENCOUNTER — Encounter (HOSPITAL_COMMUNITY): Payer: Self-pay | Admitting: Urology

## 2019-05-10 ENCOUNTER — Encounter (HOSPITAL_COMMUNITY)
Admission: RE | Disposition: A | Discharge status: Home / Self Care | Payer: Self-pay | Source: Home / Self Care | Attending: Urology

## 2019-05-10 DIAGNOSIS — N35912 Unspecified bulbous urethral stricture, male: Secondary | ICD-10-CM | POA: Insufficient documentation

## 2019-05-10 DIAGNOSIS — Z79899 Other long term (current) drug therapy: Secondary | ICD-10-CM | POA: Insufficient documentation

## 2019-05-10 DIAGNOSIS — F0280 Dementia in other diseases classified elsewhere without behavioral disturbance: Secondary | ICD-10-CM | POA: Insufficient documentation

## 2019-05-10 DIAGNOSIS — N318 Other neuromuscular dysfunction of bladder: Secondary | ICD-10-CM | POA: Insufficient documentation

## 2019-05-10 DIAGNOSIS — N360 Urethral fistula: Secondary | ICD-10-CM | POA: Insufficient documentation

## 2019-05-10 DIAGNOSIS — Z20822 Contact with and (suspected) exposure to covid-19: Secondary | ICD-10-CM | POA: Diagnosis not present

## 2019-05-10 DIAGNOSIS — G20 Parkinson's disease: Secondary | ICD-10-CM | POA: Insufficient documentation

## 2019-05-10 HISTORY — PX: CYSTOSCOPY: SHX5120

## 2019-05-10 LAB — BASIC METABOLIC PANEL
Anion gap: 7 (ref 5–15)
BUN: 26 mg/dL — ABNORMAL HIGH (ref 8–23)
CO2: 26 mmol/L (ref 22–32)
Calcium: 9.3 mg/dL (ref 8.9–10.3)
Chloride: 107 mmol/L (ref 98–111)
Creatinine, Ser: 0.63 mg/dL (ref 0.61–1.24)
GFR calc Af Amer: >60 mL/min (ref >60)
GFR calc non Af Amer: >60 mL/min (ref >60)
Glucose, Bld: 90 mg/dL (ref 70–99)
Potassium: 4.2 mmol/L (ref 3.5–5.1)
Sodium: 140 mmol/L (ref 135–145)

## 2019-05-10 LAB — CBC
HCT: 37.8 % — ABNORMAL LOW (ref 39.0–52.0)
Hemoglobin: 12.1 g/dL — ABNORMAL LOW (ref 13.0–17.0)
MCH: 31 pg (ref 26.0–34.0)
MCHC: 32 g/dL (ref 30.0–36.0)
MCV: 96.9 fL (ref 80.0–100.0)
Platelets: 263 10*3/uL (ref 150–400)
RBC: 3.9 MIL/uL — ABNORMAL LOW (ref 4.22–5.81)
RDW: 12.9 % (ref 11.5–15.5)
WBC: 8.3 10*3/uL (ref 4.0–10.5)
nRBC: 0 % (ref 0.0–0.2)

## 2019-05-10 LAB — RESPIRATORY PANEL BY RT PCR (FLU A&B, COVID)
Influenza A by PCR: NEGATIVE
Influenza B by PCR: NEGATIVE
SARS Coronavirus 2 by RT PCR: NEGATIVE

## 2019-05-10 SURGERY — CYSTOSCOPY
Anesthesia: General

## 2019-05-10 MED ORDER — ONDANSETRON HCL 4 MG/2ML IJ SOLN: 4.0000 mg | Freq: Once | INTRAMUSCULAR | Status: DC | PRN

## 2019-05-10 MED ORDER — FENTANYL CITRATE (PF) 100 MCG/2ML IJ SOLN
INTRAMUSCULAR | Status: AC
  Filled 2019-05-10: qty 2

## 2019-05-10 MED ORDER — STERILE WATER FOR IRRIGATION IR SOLN
Status: DC | PRN
  Administered 2019-05-10: 3000 mL

## 2019-05-10 MED ORDER — LIDOCAINE 2% (20 MG/ML) 5 ML SYRINGE
INTRAMUSCULAR | Status: AC
  Filled 2019-05-10: qty 5

## 2019-05-10 MED ORDER — ONDANSETRON HCL 4 MG/2ML IJ SOLN
INTRAMUSCULAR | Status: AC
  Filled 2019-05-10: qty 2

## 2019-05-10 MED ORDER — PROPOFOL 500 MG/50ML IV EMUL
INTRAVENOUS | Status: DC | PRN
  Administered 2019-05-10 (×2): 50 mg via INTRAVENOUS

## 2019-05-10 MED ORDER — PROPOFOL 10 MG/ML IV BOLUS
INTRAVENOUS | Status: AC
  Filled 2019-05-10: qty 20

## 2019-05-10 MED ORDER — MEPERIDINE HCL 50 MG/ML IJ SOLN: 6.2500 mg | INTRAMUSCULAR | Status: DC | PRN

## 2019-05-10 MED ORDER — DEXAMETHASONE SODIUM PHOSPHATE 10 MG/ML IJ SOLN
INTRAMUSCULAR | Status: AC
  Filled 2019-05-10: qty 1

## 2019-05-10 MED ORDER — HYDROCODONE-ACETAMINOPHEN 10-325 MG PO TABS: 1.0000 | ORAL_TABLET | ORAL | 0 refills | Status: DC | PRN

## 2019-05-10 MED ORDER — FENTANYL CITRATE (PF) 100 MCG/2ML IJ SOLN
INTRAMUSCULAR | Status: DC | PRN
  Administered 2019-05-10: 50 ug via INTRAVENOUS

## 2019-05-10 MED ORDER — BUPIVACAINE-EPINEPHRINE 0.25% -1:200000 IJ SOLN
INTRAMUSCULAR | Status: DC | PRN
  Administered 2019-05-10: 9 mL

## 2019-05-10 MED ORDER — FENTANYL CITRATE (PF) 100 MCG/2ML IJ SOLN
50.0000 ug | INTRAMUSCULAR | Status: DC
  Administered 2019-05-10: 50 ug via INTRAVENOUS

## 2019-05-10 MED ORDER — ONDANSETRON HCL 4 MG/2ML IJ SOLN
INTRAMUSCULAR | Status: DC | PRN
  Administered 2019-05-10: 4 mg via INTRAVENOUS

## 2019-05-10 MED ORDER — PHENYLEPHRINE HCL (PRESSORS) 10 MG/ML IV SOLN
INTRAVENOUS | Status: DC | PRN
  Administered 2019-05-10 (×2): 80 ug via INTRAVENOUS

## 2019-05-10 MED ORDER — LIDOCAINE 2% (20 MG/ML) 5 ML SYRINGE
INTRAMUSCULAR | Status: DC | PRN
  Administered 2019-05-10: 100 mg via INTRAVENOUS

## 2019-05-10 MED ORDER — SODIUM CHLORIDE 0.9 % IV SOLN
1.0000 g | Freq: Once | INTRAVENOUS | Status: DC
  Filled 2019-05-10: qty 1

## 2019-05-10 MED ORDER — DEXAMETHASONE SODIUM PHOSPHATE 10 MG/ML IJ SOLN
INTRAMUSCULAR | Status: DC | PRN
  Administered 2019-05-10: 10 mg via INTRAVENOUS

## 2019-05-10 MED ORDER — LACTATED RINGERS IV SOLN: INTRAVENOUS | Status: DC

## 2019-05-10 MED ORDER — BUPIVACAINE-EPINEPHRINE (PF) 0.25% -1:200000 IJ SOLN
INTRAMUSCULAR | Status: AC
  Filled 2019-05-10: qty 30

## 2019-05-10 MED ORDER — CEFAZOLIN SODIUM-DEXTROSE 2-4 GM/100ML-% IV SOLN
INTRAVENOUS | Status: AC
  Filled 2019-05-10: qty 100

## 2019-05-10 MED ORDER — FENTANYL CITRATE (PF) 100 MCG/2ML IJ SOLN
25.0000 ug | INTRAMUSCULAR | Status: DC | PRN
  Administered 2019-05-10 (×2): 50 ug via INTRAVENOUS

## 2019-05-10 SURGICAL SUPPLY — 15 items
BAG URO CATCHER STRL LF (MISCELLANEOUS) ×2 IMPLANT
CATH FOLEY 2WAY SLVR  5CC 20FR (CATHETERS) ×2
CATH FOLEY 2WAY SLVR 5CC 20FR (CATHETERS) ×1 IMPLANT
CATH INTERMIT  6FR 70CM (CATHETERS) IMPLANT
CLOTH BEACON ORANGE TIMEOUT ST (SAFETY) ×2 IMPLANT
GLOVE BIOGEL M 8.0 STRL (GLOVE) ×6 IMPLANT
GOWN STRL REUS W/TWL XL LVL3 (GOWN DISPOSABLE) ×4 IMPLANT
GUIDEWIRE STR DUAL SENSOR (WIRE) ×2 IMPLANT
KIT TURNOVER KIT A (KITS) ×2 IMPLANT
MANIFOLD NEPTUNE II (INSTRUMENTS) ×2 IMPLANT
PACK CYSTO (CUSTOM PROCEDURE TRAY) ×2 IMPLANT
SUT SILK 0 (SUTURE) ×2
SUT SILK 0 30XBRD TIE 6 (SUTURE) ×1 IMPLANT
TUBING CONNECTING 10 (TUBING) ×2 IMPLANT
TUBING UROLOGY SET (TUBING) IMPLANT

## 2019-05-10 NOTE — Anesthesia Postprocedure Evaluation (Signed)
Anesthesia Post Note  Patient: Mark Mathews  Procedure(s) Performed: CYSTOSCOPY WITH SUPRA PUBIC TUBE PLACEMENT (N/A )     Patient location during evaluation: PACU Anesthesia Type: General Level of consciousness: sedated and patient cooperative Pain management: pain level controlled Vital Signs Assessment: post-procedure vital signs reviewed and stable Respiratory status: spontaneous breathing Cardiovascular status: stable Anesthetic complications: no    Last Vitals:  Vitals:   05/10/19 1600 05/10/19 1700  BP: 121/74 134/65  Pulse: 89 98  Resp: 16 18  Temp: 36.7 C 36.7 C  SpO2: 99% 96%    Last Pain:  Vitals:   05/10/19 1530  PainSc: Asleep                 Lewie Loron

## 2019-05-10 NOTE — Anesthesia Preprocedure Evaluation (Signed)
Anesthesia Evaluation  Patient identified by MRN, date of birth, ID band  Reviewed: Allergy & Precautions, NPO status , Patient's Chart, lab work & pertinent test results  History of Anesthesia Complications Negative for: history of anesthetic complications  Airway Mallampati: II  TM Distance: >3 FB Neck ROM: Full    Dental  (+) Poor Dentition   Pulmonary neg pulmonary ROS,    breath sounds clear to auscultation       Cardiovascular negative cardio ROS   Rhythm:Regular Rate:Normal     Neuro/Psych Depression Dementia Advanced parkinson's: falls, hallucinations    GI/Hepatic negative GI ROS, Neg liver ROS,   Endo/Other  negative endocrine ROS  Renal/GU negative Renal ROS     Musculoskeletal   Abdominal   Peds  Hematology  (+) Blood dyscrasia (Hb 9.7), anemia ,   Anesthesia Other Findings   Reproductive/Obstetrics                             Anesthesia Physical  Anesthesia Plan  ASA: III  Anesthesia Plan: General   Post-op Pain Management:    Induction: Intravenous  PONV Risk Score and Plan: 3 and Ondansetron, Treatment may vary due to age or medical condition and Dexamethasone  Airway Management Planned: LMA  Additional Equipment: None  Intra-op Plan:   Post-operative Plan: Extubation in OR  Informed Consent: I have reviewed the patients History and Physical, chart, labs and discussed the procedure including the risks, benefits and alternatives for the proposed anesthesia with the patient or authorized representative who has indicated his/her understanding and acceptance.     Dental advisory given  Plan Discussed with: CRNA  Anesthesia Plan Comments:        Anesthesia Quick Evaluation

## 2019-05-10 NOTE — Op Note (Signed)
PATIENT:  Mark Mathews  PRE-OPERATIVE DIAGNOSIS: 1.  Urethrocutaneous fistula.   2.  End-stage Parkinson's disease  POST-OPERATIVE DIAGNOSIS:  1.  Urethrocutaneous fistula.   2.  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   3.  Bulbar urethral stricture  PROCEDURE:  1.  Cystoscopy with urethral dilatation 2.  Suprapubic cystotomy with SP tube placement  SURGEON:  Claybon Jabs  INDICATION: Mark Mathews is a  79 year old male with end-stage Parkinson's disease resulting in a neurogenic bladder and severe contraction deformities.  He had some form of iatrogenic urethral injury occur with a Foley catheter in the past and developed a urethrocutaneous fistula.  There has been some difficulty placing a Foley catheter and therefore his bladder has been managed recently with a diaper but we had discussed placement of an SP tube which could be changed much more easily due to his severe contractures.  He presents to the operating room today for SP tube placement and has been taking Levaquin preoperatively based on his preop urine culture.  ANESTHESIA:  General  EBL:  Minimal  DRAINS:   84 French Foley catheter as SP to  LOCAL MEDICATIONS USED:   10 cc of 0.25% Marcaine with epinephrine  SPECIMEN:   none  Description of procedure: After informed consent the patient was taken to the operating room and placed on the table in a supine position. General anesthesia was then administered. Once fully anesthetized the patient was moved to the dorsal lithotomy position and the lower abdomen and genitalia were sterilely prepped and draped in standard fashion. An official timeout was then performed.   the 56 French rigid cystoscope with 30 degree lens was  Passed down the urethra under direct vision.  I noted urethrocutaneous fistula and then just proximal to that in the bulbar urethra was a stricture that would not allow passage of the scope beyond this.  I therefore passed a  0.038 in sensor guidewire through the cystoscope in through the area of stricture leaving this in place in the bladder.  Over the guidewire I passed the [place]rton dilators starting at 50 French and progressing easily to 35 [place].  Repeat cystoscopy revealed the stricture was widely patent and I was able to pass the scope on into the bladder.  There is a great deal of urine in the bladder and I drained the bladder and reinspected the mucosa.  No tumors or stones were identified.  The prostatic urethra did reveal some hypertrophy of the lateral lobes.  I filled the bladder to capacity through the cystoscope and with the 70 degree lens was able to visualize the dome.  Palpation of the abdomen approximately 2 fingerbreadths above the symphysis pubis resulted in indentation of the dome of the bladder.  This area was marked and the cystoscope was then removed.  I passed the Lowsley retractor per urethra and into the bladder.  I can palpate this directly below the area that was marked.  I injected 0.25% Marcaine in this location and then made a midline incision approximately 1 cm through the skin with a 15 blade.  I can palpate the tip of the Lowsley retractor and was able to cut down on that easily with the tip passing through the skin incision.  I secured a 61 French Foley catheter to the Lowsley retractor and withdrew this into the bladder and out through the urethra.  I then released the catheter and as I withdrew the catheter back through the urethra I followed  this with the cystoscope and 30 degree lens into the bladder.  It was then filled with 10 cc of water and secured to the skin with a 0 silk suture in a figure-of-eight fashion.  It was connected to closed system drainage and a sterile dressing was applied to the SP tube site.  The patient was then awakened and taken to the recovery room in stable and satisfactory condition.  He tolerated the procedure well with no intraoperative complications.  PLAN OF CARE:  Discharge to home after PACU  PATIENT DISPOSITION:  PACU - hemodynamically stable.

## 2019-05-10 NOTE — Anesthesia Procedure Notes (Signed)
Procedure Name: LMA Insertion Date/Time: 05/10/2019 1:31 PM Performed by: Basilio Cairo, CRNA Pre-anesthesia Checklist: Patient identified, Patient being monitored, Timeout performed, Emergency Drugs available and Suction available Patient Re-evaluated:Patient Re-evaluated prior to induction Oxygen Delivery Method: Circle system utilized Preoxygenation: Pre-oxygenation with 100% oxygen Induction Type: IV induction Ventilation: Mask ventilation without difficulty LMA: LMA inserted and LMA with gastric port inserted LMA Size: 4.0 Tube type: Oral Number of attempts: 1 Placement Confirmation: positive ETCO2 and breath sounds checked- equal and bilateral Tube secured with: Tape Dental Injury: Teeth and Oropharynx as per pre-operative assessment

## 2019-05-10 NOTE — Transfer of Care (Signed)
Immediate Anesthesia Transfer of Care Note  Patient: Mark Mathews  Procedure(s) Performed: CYSTOSCOPY WITH SUPRA PUBIC TUBE PLACEMENT (N/A )  Patient Location: PACU  Anesthesia Type:General  Level of Consciousness: drowsy, patient cooperative and responds to stimulation  Airway & Oxygen Therapy: Patient Spontanous Breathing and Patient connected to face mask oxygen  Post-op Assessment: Report given to RN and Post -op Vital signs reviewed and stable  Post vital signs: Reviewed and stable  Last Vitals:  Vitals Value Taken Time  BP 133/69 05/10/19 1424  Temp    Pulse 249 05/10/19 1426  Resp 20 05/10/19 1426  SpO2 74 % 05/10/19 1426  Vitals shown include unvalidated device data.  Last Pain:  Vitals:   05/10/19 1110  PainSc: 0-No pain      Patients Stated Pain Goal: 3 (05/10/19 1310)  Complications: No apparent anesthesia complications

## 2019-05-19 NOTE — Progress Notes (Signed)
March 18th, 2021 [facility] Palliative Care Consult Note Telephone: [phone]  Fax: [phone]   [facility] DOB: August 30, 1940 MRN: [id number] [address] [place], [place] [address]   PRIMARY CARE PROVIDER:    Clovia Cuff, MD [place] [id number]   Dr. Star Age (Neurology) [phone] Dr Adah Salvage ([facility])    REFERRING PROVIDER:  Clovia Cuff, MD   RESPONSIBLE PARTY:   Winfrey Chillemi (daughter-in-law) Primary contact:  [phone]. Earmon Sherrow (son) [phone]   [employer] (for wheelchair) [phone]   ASSESSMENT / RECOMMENDATIONS:  1. Advance Care Planning: A. Directives: Forms in EPIC/VYNCA EMR. DNR. MOST: DNR/DNR, Limited Additional Interventions, antibiotics and IVFs if indicated.  B. Goals of Care: Continued improvement of sacral wound. Stay out of the hospital. Get patient OOB; awaiting wheelchair arrival.    2. Symptom Management: A. Pain: Continues multifocal/generalized  r/t bedbound status, LE contractures, and stie of sacral wound. Managed with Tylenol 1000mg  qd, down from TID. [name] administers only when patient request. They have not needed to utilize ibuprofen.   B. Constipation: Resolved. No further need for prn laxatives.   C. Sacral wound: Stage II coccyx pressure injury has increased to about the size of a 50cent piece. No signs of inflammation or infection. [facility] dsg changes q wk, with [name] changing otherwise. [name] is doing a great job with frequent turns, keeping dressing clean.    D. Stiffness and contractures of Parkinson's. Sinemet decreased to qid by neurologist.       E. Bed bound status: [employer] has wheelchair available but cost, even with insurance, is a factor to family picking it up. It would be an upfront payment of $360, with monthly rental of $55. Wheelchair would  recline, and would have  a removable side arm that will permit patient to transfer/slide from chair to bed (or car). The family are sorting out if they will pursue or not.    F. Urinary incontinence: On 05/10/2019 patient had placement of a suprapubic catheter for ease of care (severe contractures). He previously has a urinary catheter through a opening in the posterior base of his penis. There is still some leakage of urine from this area, but decreased amounts; I discussed with [name] that this opening will likely seal itself off now that it is not being kept open with a catheter. Patient c/o burning sensation about twice a day, lasting a min or two, from suprapubic cath site. I'm wondering if this may be d/t bladder spasms. Patient was on mirabegron, then changed to Tolterodine d/t cost, but family discontinued both of these d/t cost.    3.Cognitive / Functional status: (stable from previous note): Patient continues A & O x 3.; some ST memory deficits Able to follow commands, has a sweet disposition, and is cooperative. His speech is clear and on topic. Due to his Parkinson's disease, he is bedbound (has electric bed) and dependent for hygiene, dressing, and toileting. He has UE tremors. His LEs are severely contracted; patient has very limited ability to extend. On Sinemet 4x/d. If late on dose becomes flushed/increased oral secretions/pooling posterior oral pharynax. He needs assist with meals; consumes 100%.    4. Family Supports / [employer] / Coping: [name] deceased Patient's biggest source of support is his family with whom he resides. Patient living with his son [name], [name], and 2 very young granddaughters.  [place]   5. Follow up Palliative Care Visit: Family feel patient is doing well and is stable. They will call us on a prn basis   I spent 60 minutes providing this consultation from 11am to noon. More than 50% of the time in this consultation  was spent coordinating communication.    HISTORY OF PRESENT ILLNESS:  Mark Mathews is a 79 y.o. 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 I last saw patient for Palliative Care 04/13/2018 at [facility].  11/13-11/21/2020 Hospitalized for urosepsis, bilateral hydroureteronephrosis down to bladder, stercoral colitis with large volume fecal impactition. 1/29-04/09/2018: Hospitalized for sepsis 2/2// infected sacral decubitus (surgical debridement) 05/10/2019: placement of a suprapubic tube (Dr. Ihor Gully) This is a f/u Palliative Care visit from 04/01/2019.    CODE STATUS: DNR   PPS: 30%   HOSPICE ELIGIBILITY/DIAGNOSIS: TBD   PAST MEDICAL HISTORY:  Past Medical History:  Diagnosis Date  . Other and unspecified hyperlipidemia   . Paralysis agitans (HCC)   . Parkinson's disease (HCC) 10/12/2012  . Spinal stenosis     SOCIAL HX:  Social History   Tobacco Use  . Smoking status: Never Smoker  . Smokeless tobacco: Never Used  Substance Use Topics  . Alcohol use: No    Alcohol/week: 0.0 standard drinks    ALLERGIES: No Known Allergies   PERTINENT MEDICATIONS:  Outpatient Encounter Medications as of 05/20/2019  Medication Sig  . acetaminophen (TYLENOL) 500 MG tablet Take 2 tablets (1,000 mg total) by mouth every 8 (eight) hours as needed. (Patient taking differently: Take 1,000 mg by mouth every 8 (eight) hours as needed for moderate pain. )  . acidophilus (RISAQUAD) CAPS capsule Take 1 capsule by mouth daily.  . carbidopa-levodopa (SINEMET IR) 25-100 MG tablet Take 1 tablet by mouth 5 (five) times daily.  Marland Kitchen HYDROcodone-acetaminophen (NORCO) 10-325 MG tablet Take 1-2 tablets by mouth every 4 (four) hours as needed for moderate pain. Maximum dose per 24 hours - 8 pills  . levofloxacin (LEVAQUIN) 500 MG tablet Take 500 mg by mouth daily.  .  Multiple Vitamins-Minerals (MULTIVITAMIN WITH MINERALS) tablet Take 1 tablet by mouth daily.  Marland Kitchen VITAMIN E PO Take 500 Units by mouth daily.    No facility-administered encounter medications on file as of 05/20/2019.    PHYSICAL EXAM:   Physical Exam limited to decreased risk of COVID exposure Slender, frail appearing elderly Caucasian male lying on left side. He is alert and conversant. D-I-L present  Abdomen: non-distended. Suprapubic catheter in place; some redness about insertion site but no drainage or excoriation.   GU: hydrospadis; small amount urine leakage Extremities: Contractures all extremities. Muscular / adipose wasting. No edema. Skin: Sacral Stage 2 pressure injury about size of 50 cent piece. No inflammation/infection.  Neuro: UE resting tremors    Anselm Lis, NP

## 2019-05-20 ENCOUNTER — Other Ambulatory Visit: Payer: Self-pay

## 2019-05-20 ENCOUNTER — Other Ambulatory Visit: Payer: Medicare Other | Admitting: Internal Medicine

## 2019-05-20 DIAGNOSIS — Z7189 Other specified counseling: Secondary | ICD-10-CM

## 2019-05-20 DIAGNOSIS — Z515 Encounter for palliative care: Secondary | ICD-10-CM

## 2019-05-23 ENCOUNTER — Encounter: Payer: Self-pay | Admitting: Internal Medicine

## 2020-03-15 ENCOUNTER — Telehealth: Payer: Self-pay

## 2020-03-15 NOTE — Telephone Encounter (Signed)
Volunteer called patient on behalf of Palliative Care and did not get a answer from patient/family. ° °

## 2020-03-27 ENCOUNTER — Emergency Department (HOSPITAL_COMMUNITY): Payer: Medicare Other

## 2020-03-27 ENCOUNTER — Encounter (HOSPITAL_COMMUNITY): Payer: Self-pay | Admitting: Emergency Medicine

## 2020-03-27 ENCOUNTER — Other Ambulatory Visit: Payer: Self-pay

## 2020-03-27 ENCOUNTER — Inpatient Hospital Stay (HOSPITAL_COMMUNITY)
Admission: EM | Admit: 2020-03-27 | Discharge: 2020-04-03 | DRG: 177 | Disposition: A | Discharge status: Home / Self Care | Payer: Medicare Other | Attending: Internal Medicine | Admitting: Internal Medicine

## 2020-03-27 DIAGNOSIS — Z8249 Family history of ischemic heart disease and other diseases of the circulatory system: Secondary | ICD-10-CM

## 2020-03-27 DIAGNOSIS — E785 Hyperlipidemia, unspecified: Secondary | ICD-10-CM | POA: Diagnosis present

## 2020-03-27 DIAGNOSIS — R627 Adult failure to thrive: Secondary | ICD-10-CM | POA: Diagnosis present

## 2020-03-27 DIAGNOSIS — E440 Moderate protein-calorie malnutrition: Secondary | ICD-10-CM | POA: Diagnosis present

## 2020-03-27 DIAGNOSIS — G9341 Metabolic encephalopathy: Secondary | ICD-10-CM | POA: Diagnosis present

## 2020-03-27 DIAGNOSIS — Z79899 Other long term (current) drug therapy: Secondary | ICD-10-CM

## 2020-03-27 DIAGNOSIS — E876 Hypokalemia: Secondary | ICD-10-CM | POA: Diagnosis not present

## 2020-03-27 DIAGNOSIS — F0391 Unspecified dementia with behavioral disturbance: Secondary | ICD-10-CM | POA: Diagnosis present

## 2020-03-27 DIAGNOSIS — Z66 Do not resuscitate: Secondary | ICD-10-CM | POA: Diagnosis present

## 2020-03-27 DIAGNOSIS — N390 Urinary tract infection, site not specified: Secondary | ICD-10-CM | POA: Diagnosis present

## 2020-03-27 DIAGNOSIS — R1310 Dysphagia, unspecified: Secondary | ICD-10-CM | POA: Diagnosis present

## 2020-03-27 DIAGNOSIS — R4182 Altered mental status, unspecified: Secondary | ICD-10-CM | POA: Diagnosis not present

## 2020-03-27 DIAGNOSIS — Z6820 Body mass index (BMI) 20.0-20.9, adult: Secondary | ICD-10-CM

## 2020-03-27 DIAGNOSIS — U071 COVID-19: Secondary | ICD-10-CM | POA: Diagnosis not present

## 2020-03-27 DIAGNOSIS — J1282 Pneumonia due to coronavirus disease 2019: Secondary | ICD-10-CM | POA: Diagnosis present

## 2020-03-27 DIAGNOSIS — G20 Parkinson's disease: Secondary | ICD-10-CM | POA: Diagnosis present

## 2020-03-27 DIAGNOSIS — L89154 Pressure ulcer of sacral region, stage 4: Secondary | ICD-10-CM | POA: Diagnosis present

## 2020-03-27 DIAGNOSIS — J9811 Atelectasis: Secondary | ICD-10-CM | POA: Diagnosis present

## 2020-03-27 DIAGNOSIS — F03918 Unspecified dementia, unspecified severity, with other behavioral disturbance: Secondary | ICD-10-CM | POA: Diagnosis present

## 2020-03-27 DIAGNOSIS — Z7401 Bed confinement status: Secondary | ICD-10-CM

## 2020-03-27 DIAGNOSIS — S31000A Unspecified open wound of lower back and pelvis without penetration into retroperitoneum, initial encounter: Secondary | ICD-10-CM | POA: Diagnosis present

## 2020-03-27 LAB — CBC WITH DIFFERENTIAL/PLATELET
Abs Immature Granulocytes: 0.01 10*3/uL (ref 0.00–0.07)
Basophils Absolute: 0 10*3/uL (ref 0.0–0.1)
Basophils Relative: 0 %
Eosinophils Absolute: 0.1 10*3/uL (ref 0.0–0.5)
Eosinophils Relative: 2 %
HCT: 35.9 % — ABNORMAL LOW (ref 39.0–52.0)
Hemoglobin: 11.6 g/dL — ABNORMAL LOW (ref 13.0–17.0)
Immature Granulocytes: 0 %
Lymphocytes Relative: 35 %
Lymphs Abs: 1.4 10*3/uL (ref 0.7–4.0)
MCH: 30.3 pg (ref 26.0–34.0)
MCHC: 32.3 g/dL (ref 30.0–36.0)
MCV: 93.7 fL (ref 80.0–100.0)
Monocytes Absolute: 0.5 10*3/uL (ref 0.1–1.0)
Monocytes Relative: 12 %
Neutro Abs: 2.1 10*3/uL (ref 1.7–7.7)
Neutrophils Relative %: 51 %
Platelets: 213 10*3/uL (ref 150–400)
RBC: 3.83 MIL/uL — ABNORMAL LOW (ref 4.22–5.81)
RDW: 13.7 % (ref 11.5–15.5)
WBC: 4.1 10*3/uL (ref 4.0–10.5)
nRBC: 0 % (ref 0.0–0.2)

## 2020-03-27 LAB — URINALYSIS, ROUTINE W REFLEX MICROSCOPIC
Bilirubin Urine: NEGATIVE
Glucose, UA: NEGATIVE mg/dL
Ketones, ur: NEGATIVE mg/dL
Nitrite: POSITIVE — AB
Protein, ur: 100 mg/dL — AB
RBC / HPF: >50 RBC/hpf — ABNORMAL HIGH (ref 0–5)
Specific Gravity, Urine: 1.02 (ref 1.005–1.030)
pH: 7 (ref 5.0–8.0)

## 2020-03-27 LAB — COMPREHENSIVE METABOLIC PANEL
ALT: 16 U/L (ref 0–44)
AST: 97 U/L — ABNORMAL HIGH (ref 15–41)
Albumin: 2.9 g/dL — ABNORMAL LOW (ref 3.5–5.0)
Alkaline Phosphatase: 41 U/L (ref 38–126)
Anion gap: 10 (ref 5–15)
BUN: 29 mg/dL — ABNORMAL HIGH (ref 8–23)
CO2: 22 mmol/L (ref 22–32)
Calcium: 8.2 mg/dL — ABNORMAL LOW (ref 8.9–10.3)
Chloride: 109 mmol/L (ref 98–111)
Creatinine, Ser: 0.7 mg/dL (ref 0.61–1.24)
GFR, Estimated: >60 mL/min (ref >60)
Glucose, Bld: 126 mg/dL — ABNORMAL HIGH (ref 70–99)
Potassium: 3.8 mmol/L (ref 3.5–5.1)
Sodium: 141 mmol/L (ref 135–145)
Total Bilirubin: 0.3 mg/dL (ref 0.3–1.2)
Total Protein: 6.8 g/dL (ref 6.5–8.1)

## 2020-03-27 LAB — POC SARS CORONAVIRUS 2 AG -  ED: SARS Coronavirus 2 Ag: POSITIVE — AB

## 2020-03-27 LAB — PROTIME-INR
INR: 1.1 (ref 0.8–1.2)
Prothrombin Time: 13.7 seconds (ref 11.4–15.2)

## 2020-03-27 LAB — APTT: aPTT: 37 seconds — ABNORMAL HIGH (ref 24–36)

## 2020-03-27 LAB — LACTIC ACID, PLASMA: Lactic Acid, Venous: 1.4 mmol/L (ref 0.5–1.9)

## 2020-03-27 MED ORDER — SODIUM CHLORIDE 0.9 % IV BOLUS (SEPSIS)
500.0000 mL | Freq: Once | INTRAVENOUS | Status: AC
  Administered 2020-03-27: 500 mL via INTRAVENOUS

## 2020-03-27 MED ORDER — SODIUM CHLORIDE 0.9 % IV SOLN
2.0000 g | Freq: Once | INTRAVENOUS | Status: AC
  Administered 2020-03-27: 2 g via INTRAVENOUS
  Filled 2020-03-27: qty 20

## 2020-03-27 MED ORDER — ACETAMINOPHEN 650 MG RE SUPP
650.0000 mg | Freq: Once | RECTAL | Status: DC
  Filled 2020-03-27: qty 1

## 2020-03-27 NOTE — ED Provider Notes (Signed)
[facility]-EMERGENCY DEPT Provider Note   CSN: [id number] Arrival date & time: 03/27/20  2006     History Chief Complaint  Patient presents with  . Altered Mental Status    Mark Mathews is a 80 y.o. male.  The history is provided by a caregiver and medical records.   Mark Mathews is a 80 y.o. male who presents to the Emergency Department complaining of altered mental status.  Level V caveat due to confusion. History is provided by the patient's son over the phone. He lives at home with his son and his son's family. At baseline he is bedridden due to Parkinson's. Family is concerned for worsening bedsore in the sacral region that could be potentially getting infected. They state that is becoming more open and draining more recently. Towards the end of last week he began to get ill with abdominal discomfort, body aches, confusion. They state that he will only drink when he is made to drink. He did have blood in his urine years earlier, now resolved. He is sleeping more and interacting last. He is having difficulty using his hands and is requiring assistance with all his ADLs now. He has a cough and fever to 100.  No vomiting, diarrhea.  No reports of choking.  No known sick contacts.  Not vaccinated.      Past Medical History:  Diagnosis Date  . Other and unspecified hyperlipidemia   . Paralysis agitans (HCC)   . Parkinson's disease (HCC) 10/12/2012  . Spinal stenosis     Patient Active Problem List   Diagnosis Date Noted  . Bacteremia 01/16/2019  . Palliative care by specialist   . Wound infection   . Dementia with behavioral disturbance (HCC)   . Malnutrition of moderate degree 04/06/2018  . Pressure injury of skin 04/06/2018  . Severe sepsis (HCC) 04/02/2018  . Sacral wound 04/02/2018  . Abdominal tenderness 04/02/2018  . Chronic anemia 04/02/2018  . Goals of care, counseling/discussion   . Palliative care encounter   . Closed compression fracture  of L3 lumbar vertebra 04/09/2016  . Parkinson's disease (HCC) 10/12/2012    Past Surgical History:  Procedure Laterality Date  . APPENDECTOMY    . CATARACT EXTRACTION  2016   x3  . CYSTOSCOPY N/A 05/10/2019   Procedure: CYSTOSCOPY WITH SUPRA PUBIC TUBE PLACEMENT;  Surgeon: Ihor Gully, MD;  Location: [facility] ORS;  Service: Urology;  Laterality: N/A;  . INCISION AND DRAINAGE ABSCESS N/A 04/02/2018   Procedure: INCISION AND DRAINAGE debridement of sacral decubitis wound;  Surgeon: Andria Meuse, MD;  Location: [facility] OR;  Service: General;  Laterality: N/A;  . INGUINAL HERNIA REPAIR Bilateral 03/06/2009  . KIDNEY SURGERY         Family History  Problem Relation Age of Onset  . Heart failure Father     Social History   Tobacco Use  . Smoking status: Never Smoker  . Smokeless tobacco: Never Used  Vaping Use  . Vaping Use: Never used  Substance Use Topics  . Alcohol use: No    Alcohol/week: 0.0 standard drinks  . Drug use: No    Home Medications Prior to Admission medications   Medication Sig Start Date End Date Taking? Authorizing Provider  acetaminophen (TYLENOL) 500 MG tablet Take 2 tablets (1,000 mg total) by mouth every 8 (eight) hours as needed. Patient taking differently: Take 1,000 mg by mouth every 8 (eight) hours as needed for moderate pain.  04/09/18   Ghimire, [employer]  M, MD  acidophilus (RISAQUAD) CAPS capsule Take 1 capsule by mouth daily.    [provider]  carbidopa-levodopa (SINEMET IR) 25-100 MG tablet Take 1 tablet by mouth 5 (five) times daily. 01/22/19   Marguerita Merles Latif, DO  HYDROcodone-acetaminophen (NORCO) 10-325 MG tablet Take 1-2 tablets by mouth every 4 (four) hours as needed for moderate pain. Maximum dose per 24 hours - 8 pills Patient not taking: Reported on 05/23/2019 05/10/19   Ihor Gully, MD  levofloxacin (LEVAQUIN) 500 MG tablet Take 500 mg by mouth daily. 05/03/19   [provider]  Multiple Vitamins-Minerals (MULTIVITAMIN WITH  MINERALS) tablet Take 1 tablet by mouth daily.    [provider]  VITAMIN E PO Take 500 Units by mouth daily.     [provider]    Allergies    Patient has no known allergies.  Review of Systems   Review of Systems  All other systems reviewed and are negative.   Physical Exam Updated Vital Signs BP 120/61   Pulse 70   Temp 100.2 F (37.9 C) (Rectal)   Resp 15   Ht 5\' 8"  (1.727 m)   Wt 61.4 kg   SpO2 99%   BMI 20.58 kg/m   Physical Exam Vitals and nursing note reviewed.  Constitutional:      Appearance: He is well-developed and well-nourished.  HENT:     Head: Normocephalic and atraumatic.  Cardiovascular:     Rate and Rhythm: Normal rate and regular rhythm.     Heart sounds: No murmur heard.   Pulmonary:     Effort: Pulmonary effort is normal. No respiratory distress.     Comments: Course breath sounds. Abdominal:     Palpations: Abdomen is soft.     Tenderness: There is no abdominal tenderness. There is no guarding or rebound.  Genitourinary:    Comments: catheter in place Musculoskeletal:        General: No tenderness or edema.  Skin:    General: Skin is warm and dry.  Neurological:     Mental Status: He is alert.     Comments: Contractures to bilateral lower extremities. Difficult to understand speech. Denies pain. Confused. Slow to answer questions.   Psychiatric:        Mood and Affect: Mood and affect normal.        Behavior: Behavior normal.       ED Results / Procedures / Treatments   Labs (all labs ordered are listed, but only abnormal results are displayed) Labs Reviewed  COMPREHENSIVE METABOLIC PANEL - Abnormal; Notable for the following components:      Result Value   Glucose, Bld 126 (*)    BUN 29 (*)    Calcium 8.2 (*)    Albumin 2.9 (*)    AST 97 (*)    All other components within normal limits  CBC WITH DIFFERENTIAL/PLATELET - Abnormal; Notable for the following components:   RBC 3.83 (*)    Hemoglobin 11.6  (*)    HCT 35.9 (*)    All other components within normal limits  APTT - Abnormal; Notable for the following components:   aPTT 37 (*)    All other components within normal limits  URINALYSIS, ROUTINE W REFLEX MICROSCOPIC - Abnormal; Notable for the following components:   APPearance TURBID (*)    Hgb urine dipstick LARGE (*)    Protein, ur 100 (*)    Nitrite POSITIVE (*)    Leukocytes,Ua LARGE (*)  RBC / HPF >50 (*)    Bacteria, UA MANY (*)    All other components within normal limits  POC SARS CORONAVIRUS 2 AG -  ED - Abnormal; Notable for the following components:   SARS Coronavirus 2 Ag POSITIVE (*)    All other components within normal limits  URINE CULTURE  CULTURE, BLOOD (ROUTINE X 2)  CULTURE, BLOOD (ROUTINE X 2)  LACTIC ACID, PLASMA  PROTIME-INR    EKG None  Radiology DG Chest Port 1 View  Result Date: 03/27/2020 CLINICAL DATA:  Questionable sepsis.  Decreased responsiveness. EXAM: PORTABLE CHEST 1 VIEW COMPARISON:  02/27/2019 FINDINGS: Low lung volumes. Mild cardiomegaly. Minimal bibasilar densities, likely atelectasis. No effusions. No acute bony abnormality. IMPRESSION: Low lung volumes, bibasilar atelectasis. Electronically Signed   By: Charlett Nose M.D.   On: 03/27/2020 22:02    Procedures Procedures   Medications Ordered in ED Medications  acetaminophen (TYLENOL) suppository 650 mg (650 mg Rectal Patient Refused/Not Given 03/27/20 2347)  sodium chloride 0.9 % bolus 500 mL (0 mLs Intravenous Stopped 03/27/20 2236)  cefTRIAXone (ROCEPHIN) 2 g in sodium chloride 0.9 % 100 mL IVPB (0 g Intravenous Stopped 03/27/20 2251)    ED Course  I have reviewed the triage vital signs and the nursing notes.  Pertinent labs & imaging results that were available during my care of the patient were reviewed by me and considered in my medical decision making (see chart for details).    MDM Rules/Calculators/A&P                         patient presents the emergency  department from home for evaluation of worsening mental status, cough, abdominal pain, blood urine and worsening sacral wound for the last several days. At baseline he is bedbound. He has previously been on hospice with [facility] last year, but is not currently on hospice. He is febrile, confused and dehydrated appearing on presentation. He was treated with antibiotics for possible urinary tract infection in setting of symptoms. He has a sacral wound, the does have a very small area of necrotic tissue but overtly does not look significantly infected. Patient is positive for COVID-19 infection. He does not have any hypoxia. He does have a cough. Chest x-ray without focal infiltrate. Unclear if fever is coming from urinary tract source versus COVID. Given progressive worsening in mental status, inability to take liquids without difficulty medicine consulted for admission for ongoing treatment.  Final Clinical Impression(s) / ED Diagnoses Final diagnoses:  COVID-19 virus infection  Acute UTI    Rx / DC Orders ED Discharge Orders    None       Tilden Fossa, MD 03/27/20 2349

## 2020-03-27 NOTE — ED Triage Notes (Signed)
Pt arrived via EMS from home. Pt has altered mental status and decreased responsiveness from pt. Pt has had increased swelling around suprapubic catheter site with blood in his urine for 3 days. Urine is cloudy. Pt developed a fever today. Pt has an open bedsore on his bottom.  Pt has hx of parkinson's

## 2020-03-28 ENCOUNTER — Inpatient Hospital Stay (HOSPITAL_COMMUNITY): Payer: Medicare Other

## 2020-03-28 ENCOUNTER — Encounter (HOSPITAL_COMMUNITY): Payer: Self-pay

## 2020-03-28 ENCOUNTER — Emergency Department (HOSPITAL_COMMUNITY): Payer: Medicare Other

## 2020-03-28 DIAGNOSIS — Z79899 Other long term (current) drug therapy: Secondary | ICD-10-CM | POA: Diagnosis not present

## 2020-03-28 DIAGNOSIS — N390 Urinary tract infection, site not specified: Secondary | ICD-10-CM | POA: Diagnosis present

## 2020-03-28 DIAGNOSIS — J9811 Atelectasis: Secondary | ICD-10-CM | POA: Diagnosis present

## 2020-03-28 DIAGNOSIS — F0391 Unspecified dementia with behavioral disturbance: Secondary | ICD-10-CM | POA: Diagnosis present

## 2020-03-28 DIAGNOSIS — E785 Hyperlipidemia, unspecified: Secondary | ICD-10-CM | POA: Diagnosis present

## 2020-03-28 DIAGNOSIS — U071 COVID-19: Secondary | ICD-10-CM | POA: Diagnosis present

## 2020-03-28 DIAGNOSIS — G9341 Metabolic encephalopathy: Secondary | ICD-10-CM | POA: Diagnosis present

## 2020-03-28 DIAGNOSIS — R627 Adult failure to thrive: Secondary | ICD-10-CM | POA: Diagnosis present

## 2020-03-28 DIAGNOSIS — Z66 Do not resuscitate: Secondary | ICD-10-CM | POA: Diagnosis present

## 2020-03-28 DIAGNOSIS — R4182 Altered mental status, unspecified: Secondary | ICD-10-CM | POA: Diagnosis present

## 2020-03-28 DIAGNOSIS — Z7401 Bed confinement status: Secondary | ICD-10-CM | POA: Diagnosis not present

## 2020-03-28 DIAGNOSIS — E440 Moderate protein-calorie malnutrition: Secondary | ICD-10-CM | POA: Diagnosis present

## 2020-03-28 DIAGNOSIS — L89154 Pressure ulcer of sacral region, stage 4: Secondary | ICD-10-CM | POA: Diagnosis present

## 2020-03-28 DIAGNOSIS — R410 Disorientation, unspecified: Secondary | ICD-10-CM | POA: Diagnosis not present

## 2020-03-28 DIAGNOSIS — Z6820 Body mass index (BMI) 20.0-20.9, adult: Secondary | ICD-10-CM | POA: Diagnosis not present

## 2020-03-28 DIAGNOSIS — J1282 Pneumonia due to coronavirus disease 2019: Secondary | ICD-10-CM | POA: Diagnosis present

## 2020-03-28 DIAGNOSIS — G20 Parkinson's disease: Secondary | ICD-10-CM | POA: Diagnosis not present

## 2020-03-28 DIAGNOSIS — R1310 Dysphagia, unspecified: Secondary | ICD-10-CM | POA: Diagnosis present

## 2020-03-28 DIAGNOSIS — Z8249 Family history of ischemic heart disease and other diseases of the circulatory system: Secondary | ICD-10-CM | POA: Diagnosis not present

## 2020-03-28 DIAGNOSIS — E876 Hypokalemia: Secondary | ICD-10-CM | POA: Diagnosis not present

## 2020-03-28 MED ORDER — GUAIFENESIN-DM 100-10 MG/5ML PO SYRP
10.0000 mL | ORAL_SOLUTION | ORAL | Status: DC | PRN
  Filled 2020-03-28: qty 10

## 2020-03-28 MED ORDER — ONDANSETRON HCL 4 MG PO TABS: 4.0000 mg | ORAL_TABLET | Freq: Four times a day (QID) | ORAL | Status: DC | PRN

## 2020-03-28 MED ORDER — SODIUM CHLORIDE 0.9 % IV SOLN
3.0000 g | Freq: Four times a day (QID) | INTRAVENOUS | Status: DC
  Administered 2020-03-28 – 2020-04-03 (×25): 3 g via INTRAVENOUS
  Filled 2020-03-28 (×3): qty 3
  Filled 2020-03-28: qty 8
  Filled 2020-03-28: qty 3
  Filled 2020-03-28: qty 8
  Filled 2020-03-28 (×6): qty 3
  Filled 2020-03-28 (×2): qty 8
  Filled 2020-03-28 (×4): qty 3
  Filled 2020-03-28 (×2): qty 8
  Filled 2020-03-28 (×2): qty 3
  Filled 2020-03-28 (×2): qty 8
  Filled 2020-03-28 (×3): qty 3

## 2020-03-28 MED ORDER — ONDANSETRON HCL 4 MG/2ML IJ SOLN: 4.0000 mg | Freq: Four times a day (QID) | INTRAMUSCULAR | Status: DC | PRN

## 2020-03-28 MED ORDER — CARBIDOPA-LEVODOPA 25-100 MG PO TABS: 1.0000 | ORAL_TABLET | Freq: Every day | ORAL | Status: DC

## 2020-03-28 MED ORDER — ACETAMINOPHEN 325 MG PO TABS
650.0000 mg | ORAL_TABLET | Freq: Four times a day (QID) | ORAL | Status: DC | PRN
  Administered 2020-03-29 – 2020-04-02 (×7): 650 mg via ORAL
  Filled 2020-03-28 (×8): qty 2

## 2020-03-28 MED ORDER — VITAMIN D 25 MCG (1000 UNIT) PO TABS
1000.0000 [IU] | ORAL_TABLET | Freq: Every day | ORAL | Status: DC
  Administered 2020-03-29 – 2020-04-03 (×6): 1000 [IU] via ORAL
  Filled 2020-03-28 (×7): qty 1

## 2020-03-28 MED ORDER — SODIUM CHLORIDE 0.9 % IV SOLN
100.0000 mg | Freq: Every day | INTRAVENOUS | Status: AC
  Administered 2020-03-29 – 2020-04-01 (×4): 100 mg via INTRAVENOUS
  Filled 2020-03-28 (×4): qty 20

## 2020-03-28 MED ORDER — HEPARIN SODIUM (PORCINE) 5000 UNIT/ML IJ SOLN
5000.0000 [IU] | Freq: Three times a day (TID) | INTRAMUSCULAR | Status: DC
  Administered 2020-03-28 – 2020-04-03 (×16): 5000 [IU] via SUBCUTANEOUS
  Filled 2020-03-28 (×18): qty 1

## 2020-03-28 MED ORDER — IPRATROPIUM BROMIDE HFA 17 MCG/ACT IN AERS
2.0000 | INHALATION_SPRAY | Freq: Four times a day (QID) | RESPIRATORY_TRACT | Status: DC
  Administered 2020-03-28 – 2020-03-30 (×6): 2 via RESPIRATORY_TRACT
  Filled 2020-03-28: qty 12.9

## 2020-03-28 MED ORDER — ORAL CARE MOUTH RINSE
15.0000 mL | Freq: Two times a day (BID) | OROMUCOSAL | Status: DC
  Administered 2020-03-29 – 2020-04-03 (×9): 15 mL via OROMUCOSAL

## 2020-03-28 MED ORDER — CARBIDOPA-LEVODOPA 25-100 MG PO TABS
1.0000 | ORAL_TABLET | Freq: Every day | ORAL | Status: DC
  Administered 2020-03-28 – 2020-04-03 (×29): 1 via ORAL
  Filled 2020-03-28 (×29): qty 1

## 2020-03-28 MED ORDER — BISACODYL 10 MG RE SUPP: 10.0000 mg | Freq: Every day | RECTAL | Status: DC | PRN

## 2020-03-28 MED ORDER — SODIUM CHLORIDE 0.9 % IV SOLN
200.0000 mg | Freq: Once | INTRAVENOUS | Status: AC
  Administered 2020-03-28: 200 mg via INTRAVENOUS
  Filled 2020-03-28: qty 200

## 2020-03-28 MED ORDER — ADULT MULTIVITAMIN W/MINERALS CH
1.0000 | ORAL_TABLET | Freq: Every day | ORAL | Status: DC
  Administered 2020-03-29 – 2020-04-03 (×6): 1 via ORAL
  Filled 2020-03-28 (×7): qty 1

## 2020-03-28 NOTE — Consult Note (Addendum)
WOC Nurse Consult Note: Reason for Consult: Consult requested for sacrum.  Performed remotely after review of progress notes and photo in the EMR. CT scan is pending to r/o osteomyelitis since pt appears to be septic with unknown etiology.  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 Topical treatment orders provided for bedside nurses to perform as follows to absorb drainage and provide antimicrobial benefits: Apply Aquacel packing [place] # [phone]) to sacrum wound Q day, using swab to fill, then cover with ABD pads and tape. Please re-consult if further assistance is needed.  Thank-you,  Cammie Mcgee MSN, RN, CWOCN, [place], CNS [phone]

## 2020-03-28 NOTE — Progress Notes (Signed)
Replaced suprapubic catheter due to dislodgement. Attempted to insert 51F straight tip catheter and unable to advance cath. Placed 66F cath and urine return. Blood noted around insertion site and small clots with initial placement in foley. Gerhart Ruggieri A

## 2020-03-28 NOTE — H&P (Signed)
History and Physical    [name]:[id number] DOB: 12-13-1940 DOA: 03/27/2020  PCP: Patient, No Pcp Per  Patient coming from: Home  I have personally briefly reviewed patient's old medical records in [facility]  Chief Complaint: Altered mental status.  HPI: Mark Mathews is a 80 y.o. male with Parkinson's, chronic sacral decubitus ulcer presented to ED for altered mentation.  He lives at home with his son and his son's family. At baseline he is bedridden due to Parkinson's. Family is concerned for worsening bedsore in the sacral region that could be potentially getting infected. They state that is becoming more open and draining more recently.   He has previously been on hospice with [facility] last year, but is not currently on hospice.  No vomiting, diarrhea.  No reports of choking.  No known sick contacts.  Not vaccinated.     ED Course: Tmax 100.2 Blood pressure 93/51-122/92, pulse 59, RR 17, SPO2 99% room air  -WBC 4.1, Hb 11.6, platelets 213 -Sodium 141, potassium 3.8, BUN 29, creatinine 0.70 -Albumin 2.9, AST 97  -Urinalysis with large leukocytes nitrites WBCs and RBCs. -Covid PCR positive.  Lactic acid 1.4  -CXR with low lung volumes and bibasilar atelectasis. -CT head and cervical spine without acute abnormality but noted for by apical pneumonia -CT abdomen w/o definitive findings to suggest sacral osteomyelitis. A decubitusulcer is seen. Bibasilar atelectatic changes. This may be related to the underlying COVID-19 positivity.  ED meds: -Ceftriaxone and 500 cc of normal saline.    Review of Systems: As per HPI otherwise 10 point review of systems negative.    Past Medical History:  Diagnosis Date  . Other and unspecified hyperlipidemia   . Paralysis agitans (HCC)   . Parkinson's disease (HCC) 10/12/2012  . Spinal stenosis     Past Surgical History:  Procedure Laterality Date  . APPENDECTOMY    . CATARACT EXTRACTION  2016   x3  .  CYSTOSCOPY N/A 05/10/2019   Procedure: CYSTOSCOPY WITH SUPRA PUBIC TUBE PLACEMENT;  Surgeon: Ihor Gully, MD;  Location: [facility] OR[facility];  Service: Urology;  Laterality: N/A;  . INCISION AND DRAINAGE ABSCESS N/A 04/02/2018   Procedure: INCISION AND DRAINAGE debridement of sacral decubitis wound;  Surgeon: Andria Meuse, MD;  Location: [facility] OR;  Service: General;  Laterality: N/A;  . INGUINAL HERNIA REPAIR Bilateral 03/06/2009  . KIDNEY SURGERY       reports that he has never smoked. He has never used smokeless tobacco. He reports that he does not drink alcohol and does not use drugs.  No Known Allergies  Family History  Problem Relation Age of Onset  . Heart failure Father     Prior to Admission medications   Medication Sig Start Date End Date Taking? Authorizing Provider  acetaminophen (TYLENOL) 500 MG tablet Take 2 tablets (1,000 mg total) by mouth every 8 (eight) hours as needed. Patient taking differently: Take 1,000 mg by mouth every 8 (eight) hours as needed for moderate pain. 04/09/18  Yes Ghimire, Werner Lean, MD  carbidopa-levodopa (SINEMET IR) 25-100 MG tablet Take 1 tablet by mouth 5 (five) times daily. 01/22/19  Yes Sheikh, Omair Latif, DO  cholecalciferol (VITAMIN D3) 25 MCG (1000 UNIT) tablet Take 1,000 Units by mouth daily.   Yes [provider]  HYDROcodone-acetaminophen (NORCO) 10-325 MG tablet Take 1-2 tablets by mouth every 4 (four) hours as needed for moderate pain. Maximum dose per 24 hours - 8 pills Patient not taking: No sig reported  05/10/19   Ihor Gully, MD  levofloxacin (LEVAQUIN) 500 MG tablet Take 500 mg by mouth daily. Patient not taking: Reported on 03/27/2020 05/03/19   [provider]  mirabegron ER (MYRBETRIQ) 50 MG TB24 tablet Take 50 mg by mouth daily. Patient not taking: No sig reported    [provider]  Multiple Vitamins-Minerals (MULTIVITAMIN WITH MINERALS) tablet Take 1 tablet by mouth daily. Patient not taking: Reported on  03/27/2020    [provider]  VITAMIN E PO Take 500 Units by mouth daily.  Patient not taking: Reported on 03/27/2020    [provider]    Physical Exam: Vitals:   03/28/20 0445 03/28/20 0545 03/28/20 0615 03/28/20 0700  BP: 109/61 (!) 97/51 (!) 102/52 (!) 96/52  Pulse: 66 (!) 59 62 (!) 59  Resp: 20 17 16 17   Temp:      TempSrc:      SpO2: 100% 100% 99% 100%  Weight:      Height:        Constitutional: NAD, calm, comfortable Appears oriented but hard of hearing. Responds appropriately to questions.    Vitals:   03/28/20 1600 03/28/20 1615 03/28/20 1630 03/28/20 1733  BP: 114/76 124/62 106/68   Pulse: 70 75 72   Resp:   18   Temp:      TempSrc:      SpO2: 97% 98% 99% 92%  Weight:      Height:       Eyes: PERRL, lids and conjunctivae normal ENMT: Mucous membranes are moist. Posterior pharynx clear of any exudate or lesions.Normal dentition.  Neck: normal, supple, no masses, no thyromegaly Respiratory: Decreased air entry bilaterally   Cardiovascular: Regular rate and rhythm, no murmurs / rubs / gallops. No extremity edema. 2+ pedal pulses. No carotid bruits.  Abdomen: no tenderness, no masses palpated. No hepatosplenomegaly. Bowel sounds positive.  Musculoskeletal: no clubbing / cyanosis. No joint deformity upper and lower extremities. Good ROM, no contractures. Normal muscle tone.   Skin: Large sacral decubitus ulcer present.   No active discharge noted.  Neurologic: baseline tremor present.  Psychiatric: Normal judgment and insight. Alert and oriented x 3. Normal mood.    Labs on Admission: I have personally reviewed following labs and imaging studies  CBC: Recent Labs  Lab 03/27/20 2143  WBC 4.1  NEUTROABS 2.1  HGB 11.6*  HCT 35.9*  MCV 93.7  PLT 213   Basic Metabolic Panel: Recent Labs  Lab 03/27/20 2143  NA 141  K 3.8  CL 109  CO2 22  GLUCOSE 126*  BUN 29*  CREATININE 0.70  CALCIUM 8.2*   GFR: Estimated Creatinine  Clearance: 64 mL/min (by C-G formula based on SCr of 0.7 mg/dL). Liver Function Tests: Recent Labs  Lab 03/27/20 2143  AST 97*  ALT 16  ALKPHOS 41  BILITOT 0.3  PROT 6.8  ALBUMIN 2.9*   No results for input(s): LIPASE, AMYLASE in the last 168 hours. No results for input(s): AMMONIA in the last 168 hours. Coagulation Profile: Recent Labs  Lab 03/27/20 2143  INR 1.1   Urine analysis:    Component Value Date/Time   COLORURINE YELLOW 03/27/2020 2313   APPEARANCEUR TURBID (A) 03/27/2020 2313   LABSPEC 1.020 03/27/2020 2313   PHURINE 7.0 03/27/2020 2313   GLUCOSEU NEGATIVE 03/27/2020 2313   HGBUR LARGE (A) 03/27/2020 2313   BILIRUBINUR NEGATIVE 03/27/2020 2313   KETONESUR NEGATIVE 03/27/2020 2313   PROTEINUR 100 (A) 03/27/2020 2313   NITRITE POSITIVE (  A) 03/27/2020 2313   LEUKOCYTESUR LARGE (A) 03/27/2020 2313    Radiological Exams on Admission: CT ABDOMEN PELVIS WO CONTRAST  Result Date: 03/28/2020 CLINICAL DATA:  Hematuria and fevers with soft tissue swelling surrounding suprapubic catheter EXAM: CT ABDOMEN AND PELVIS WITHOUT CONTRAST TECHNIQUE: Multidetector CT imaging of the abdomen and pelvis was performed following the standard protocol without IV contrast. COMPARISON:  01/15/2019 FINDINGS: Lower chest: Bibasilar atelectatic changes are seen. No sizable effusion is noted. These changes are likely in part related to the known history of COVID-19 positivity. Hepatobiliary: No focal liver abnormality is seen. No gallstones, gallbladder wall thickening, or biliary dilatation. Pancreas: Mild fatty interdigitation of the pancreas is seen. No focal mass is noted. Spleen: Normal in size without focal abnormality. Adrenals/Urinary Tract: Adrenal glands are within normal limits. Left kidney demonstrates a prominent extrarenal pelvis similar to that seen on the prior exam. Prominent extrarenal pelvis on the right is noted as well. Tiny nonobstructing right renal stone is seen. Ureters are  within normal limits. The bladder is decompressed with mild wall thickening related to the decompressive nature. By history a suprapubic catheter is present although no definitive catheter is seen. Mild induration at the previous catheter site is seen without abscess formation. Stomach/Bowel: Prominent fecal material within the rectal vault is noted although no obstructive changes are seen. No significant constipation is noted. The appendix is not visualized consistent with prior surgical history. Stomach and small bowel are within normal limits. Vascular/Lymphatic: Aortic atherosclerosis. No enlarged abdominal or pelvic lymph nodes. Reproductive: Prostate is unremarkable. Other: No abdominal wall hernia or abnormality. No abdominopelvic ascites. Musculoskeletal: Decubitus ulcer is noted posteriorly. No definitive bony erosive changes are identified to suggest osteomyelitis. No focal abscess is seen. Degenerative changes of lumbosacral spine are noted. IMPRESSION: No definitive findings to suggest sacral osteomyelitis. A decubitus ulcer is seen. Bibasilar atelectatic changes. This may be related to the underlying COVID-19 positivity. Tiny nonobstructing stone. By history, a suprapubic catheter is present although no catheter is identified on this exam. Some induration at the previous catheter site is noted. Clinical correlation is recommended. Mild prominent fecal material within the rectal wall without obstructive change. Electronically Signed   By: Alcide CleverMark  Lukens M.D.   On: 03/28/2020 08:53   CT Head Wo Contrast  Result Date: 03/28/2020 CLINICAL DATA:  Encephalopathy.  COVID-19. EXAM: CT HEAD WITHOUT CONTRAST CT CERVICAL SPINE WITHOUT CONTRAST TECHNIQUE: Multidetector CT imaging of the head and cervical spine was performed following the standard protocol without intravenous contrast. Multiplanar CT image reconstructions of the cervical spine were also generated. COMPARISON:  None. FINDINGS: CT HEAD FINDINGS  Brain: There is no mass, hemorrhage or extra-axial collection. There is generalized atrophy without lobar predilection. There is hypoattenuation of the periventricular white matter, most commonly indicating chronic ischemic microangiopathy. Vascular: No abnormal hyperdensity of the major intracranial arteries or dural venous sinuses. No intracranial atherosclerosis. Skull: The visualized skull base, calvarium and extracranial soft tissues are normal. Sinuses/Orbits: Bilateral mastoid fluid.  The orbits are normal. CT CERVICAL SPINE FINDINGS Alignment: No static subluxation. Facets are aligned. Occipital condyles are normally positioned. Skull base and vertebrae: No acute fracture. Soft tissues and spinal canal: No prevertebral fluid or swelling. No visible canal hematoma. Disc levels: No advanced spinal canal or neural foraminal stenosis. Upper chest: Biapical opacities. Other: Normal visualized paraspinal cervical soft tissues. IMPRESSION: 1. No acute intracranial abnormality. 2. No acute fracture or static subluxation of the cervical spine. 3. Bilateral mastoid fluid. 4. Biapical pneumonia. Electronically  Signed   By: Deatra Robinson M.D.   On: 03/28/2020 01:51   CT Cervical Spine Wo Contrast  Result Date: 03/28/2020 CLINICAL DATA:  Encephalopathy.  COVID-19. EXAM: CT HEAD WITHOUT CONTRAST CT CERVICAL SPINE WITHOUT CONTRAST TECHNIQUE: Multidetector CT imaging of the head and cervical spine was performed following the standard protocol without intravenous contrast. Multiplanar CT image reconstructions of the cervical spine were also generated. COMPARISON:  None. FINDINGS: CT HEAD FINDINGS Brain: There is no mass, hemorrhage or extra-axial collection. There is generalized atrophy without lobar predilection. There is hypoattenuation of the periventricular white matter, most commonly indicating chronic ischemic microangiopathy. Vascular: No abnormal hyperdensity of the major intracranial arteries or dural venous  sinuses. No intracranial atherosclerosis. Skull: The visualized skull base, calvarium and extracranial soft tissues are normal. Sinuses/Orbits: Bilateral mastoid fluid.  The orbits are normal. CT CERVICAL SPINE FINDINGS Alignment: No static subluxation. Facets are aligned. Occipital condyles are normally positioned. Skull base and vertebrae: No acute fracture. Soft tissues and spinal canal: No prevertebral fluid or swelling. No visible canal hematoma. Disc levels: No advanced spinal canal or neural foraminal stenosis. Upper chest: Biapical opacities. Other: Normal visualized paraspinal cervical soft tissues. IMPRESSION: 1. No acute intracranial abnormality. 2. No acute fracture or static subluxation of the cervical spine. 3. Bilateral mastoid fluid. 4. Biapical pneumonia. Electronically Signed   By: Deatra Robinson M.D.   On: 03/28/2020 01:51   DG Chest Port 1 View  Result Date: 03/27/2020 CLINICAL DATA:  Questionable sepsis.  Decreased responsiveness. EXAM: PORTABLE CHEST 1 VIEW COMPARISON:  02/27/2019 FINDINGS: Low lung volumes. Mild cardiomegaly. Minimal bibasilar densities, likely atelectasis. No effusions. No acute bony abnormality. IMPRESSION: Low lung volumes, bibasilar atelectasis. Electronically Signed   By: Charlett Nose M.D.   On: 03/27/2020 22:02    EKG: Independently reviewed. NSR@77bpm , LAD LAFB, RBBB with reciprocal changes.    Assessment/Plan Active Problems:   Parkinson's disease (HCC)   Sacral wound   Malnutrition of moderate degree   Dementia with behavioral disturbance (HCC)   Acute lower UTI   COVID-19 virus infection   Altered mental status  #Altered mentation -Likely secondary to UTI. -F/u BCx and UCx. -C/w Unasyn.  #Covid infection -Not hypoxic. Inflammatory markers pending.  -Will administer Remdesevir.  -Hold decadron for now.   #Parkinson's disease -C/w Sinemet.   #Immobility resulting in sacral decubitus ulcer -Wound care RN.   #Failure to thrive.  DVT  prophylaxis: Subcu heparin.    Code Status: DNR Family Communication: Son.  Disposition Plan: Home versus SNF.    Consults called: None. Admission status: inpt on MedSurg.  Garrin Kirwan MD [facility]   If 7PM-7AM, please contact night-coverage [email or web address] Password [facility]  03/28/2020, 7:18 AM

## 2020-03-28 NOTE — ED Provider Notes (Signed)
Nursing notes and vitals signs, including pulse oximetry, reviewed.  Summary of this visit's results, reviewed by myself:  EKG:  EKG Interpretation  Date/Time:    Ventricular Rate:    PR Interval:    QRS Duration:   QT Interval:    QTC Calculation:   R Axis:     Text Interpretation:         Labs:  Results for orders placed or performed during the hospital encounter of 03/27/20 (from the past 24 hour(s))  Comprehensive metabolic panel     Status: Abnormal   Collection Time: 03/27/20  9:43 PM  Result Value Ref Range   Sodium 141 135 - 145 mmol/L   Potassium 3.8 3.5 - 5.1 mmol/L   Chloride 109 98 - 111 mmol/L   CO2 22 22 - 32 mmol/L   Glucose, Bld 126 (H) 70 - 99 mg/dL   BUN 29 (H) 8 - 23 mg/dL   Creatinine, Ser 4.96 0.61 - 1.24 mg/dL   Calcium 8.2 (L) 8.9 - 10.3 mg/dL   Total Protein 6.8 6.5 - 8.1 g/dL   Albumin 2.9 (L) 3.5 - 5.0 g/dL   AST 97 (H) 15 - 41 U/L   ALT 16 0 - 44 U/L   Alkaline Phosphatase 41 38 - 126 U/L   Total Bilirubin 0.3 0.3 - 1.2 mg/dL   GFR, Estimated >75 >91 mL/min   Anion gap 10 5 - 15  CBC WITH DIFFERENTIAL     Status: Abnormal   Collection Time: 03/27/20  9:43 PM  Result Value Ref Range   WBC 4.1 4.0 - 10.5 K/uL   RBC 3.83 (L) 4.22 - 5.81 MIL/uL   Hemoglobin 11.6 (L) 13.0 - 17.0 g/dL   HCT 63.8 (L) 46.6 - 59.9 %   MCV 93.7 80.0 - 100.0 fL   MCH 30.3 26.0 - 34.0 pg   MCHC 32.3 30.0 - 36.0 g/dL   RDW 35.7 01.7 - 79.3 %   Platelets 213 150 - 400 K/uL   nRBC 0.0 0.0 - 0.2 %   Neutrophils Relative % 51 %   Neutro Abs 2.1 1.7 - 7.7 K/uL   Lymphocytes Relative 35 %   Lymphs Abs 1.4 0.7 - 4.0 K/uL   Monocytes Relative 12 %   Monocytes Absolute 0.5 0.1 - 1.0 K/uL   Eosinophils Relative 2 %   Eosinophils Absolute 0.1 0.0 - 0.5 K/uL   Basophils Relative 0 %   Basophils Absolute 0.0 0.0 - 0.1 K/uL   Immature Granulocytes 0 %   Abs Immature Granulocytes 0.01 0.00 - 0.07 K/uL  Protime-INR     Status: None   Collection Time: 03/27/20  9:43 PM   Result Value Ref Range   Prothrombin Time 13.7 11.4 - 15.2 seconds   INR 1.1 0.8 - 1.2  APTT     Status: Abnormal   Collection Time: 03/27/20  9:43 PM  Result Value Ref Range   aPTT 37 (H) 24 - 36 seconds  Lactic acid, plasma     Status: None   Collection Time: 03/27/20 10:00 PM  Result Value Ref Range   Lactic Acid, Venous 1.4 0.5 - 1.9 mmol/L  POC SARS Coronavirus 2 Ag-ED - Nasal Swab     Status: Abnormal   Collection Time: 03/27/20 10:35 PM  Result Value Ref Range   SARS Coronavirus 2 Ag POSITIVE (A) NEGATIVE  Urinalysis, Routine w reflex microscopic Urine, Catheterized     Status: Abnormal   Collection Time: 03/27/20 11:13  PM  Result Value Ref Range   Color, Urine YELLOW YELLOW   APPearance TURBID (A) CLEAR   Specific Gravity, Urine 1.020 1.005 - 1.030   pH 7.0 5.0 - 8.0   Glucose, UA NEGATIVE NEGATIVE mg/dL   Hgb urine dipstick LARGE (A) NEGATIVE   Bilirubin Urine NEGATIVE NEGATIVE   Ketones, ur NEGATIVE NEGATIVE mg/dL   Protein, ur 469 (A) NEGATIVE mg/dL   Nitrite POSITIVE (A) NEGATIVE   Leukocytes,Ua LARGE (A) NEGATIVE   RBC / HPF >50 (H) 0 - 5 RBC/hpf   WBC, UA 21-50 0 - 5 WBC/hpf   Bacteria, UA MANY (A) NONE SEEN   WBC Clumps PRESENT    Mucus PRESENT    Amorphous Crystal PRESENT     Imaging Studies: CT Head Wo Contrast  Result Date: 03/28/2020 CLINICAL DATA:  Encephalopathy.  COVID-19. EXAM: CT HEAD WITHOUT CONTRAST CT CERVICAL SPINE WITHOUT CONTRAST TECHNIQUE: Multidetector CT imaging of the head and cervical spine was performed following the standard protocol without intravenous contrast. Multiplanar CT image reconstructions of the cervical spine were also generated. COMPARISON:  None. FINDINGS: CT HEAD FINDINGS Brain: There is no mass, hemorrhage or extra-axial collection. There is generalized atrophy without lobar predilection. There is hypoattenuation of the periventricular white matter, most commonly indicating chronic ischemic microangiopathy. Vascular: No  abnormal hyperdensity of the major intracranial arteries or dural venous sinuses. No intracranial atherosclerosis. Skull: The visualized skull base, calvarium and extracranial soft tissues are normal. Sinuses/Orbits: Bilateral mastoid fluid.  The orbits are normal. CT CERVICAL SPINE FINDINGS Alignment: No static subluxation. Facets are aligned. Occipital condyles are normally positioned. Skull base and vertebrae: No acute fracture. Soft tissues and spinal canal: No prevertebral fluid or swelling. No visible canal hematoma. Disc levels: No advanced spinal canal or neural foraminal stenosis. Upper chest: Biapical opacities. Other: Normal visualized paraspinal cervical soft tissues. IMPRESSION: 1. No acute intracranial abnormality. 2. No acute fracture or static subluxation of the cervical spine. 3. Bilateral mastoid fluid. 4. Biapical pneumonia. Electronically Signed   By: Deatra Robinson M.D.   On: 03/28/2020 01:51   CT Cervical Spine Wo Contrast  Result Date: 03/28/2020 CLINICAL DATA:  Encephalopathy.  COVID-19. EXAM: CT HEAD WITHOUT CONTRAST CT CERVICAL SPINE WITHOUT CONTRAST TECHNIQUE: Multidetector CT imaging of the head and cervical spine was performed following the standard protocol without intravenous contrast. Multiplanar CT image reconstructions of the cervical spine were also generated. COMPARISON:  None. FINDINGS: CT HEAD FINDINGS Brain: There is no mass, hemorrhage or extra-axial collection. There is generalized atrophy without lobar predilection. There is hypoattenuation of the periventricular white matter, most commonly indicating chronic ischemic microangiopathy. Vascular: No abnormal hyperdensity of the major intracranial arteries or dural venous sinuses. No intracranial atherosclerosis. Skull: The visualized skull base, calvarium and extracranial soft tissues are normal. Sinuses/Orbits: Bilateral mastoid fluid.  The orbits are normal. CT CERVICAL SPINE FINDINGS Alignment: No static subluxation.  Facets are aligned. Occipital condyles are normally positioned. Skull base and vertebrae: No acute fracture. Soft tissues and spinal canal: No prevertebral fluid or swelling. No visible canal hematoma. Disc levels: No advanced spinal canal or neural foraminal stenosis. Upper chest: Biapical opacities. Other: Normal visualized paraspinal cervical soft tissues. IMPRESSION: 1. No acute intracranial abnormality. 2. No acute fracture or static subluxation of the cervical spine. 3. Bilateral mastoid fluid. 4. Biapical pneumonia. Electronically Signed   By: Deatra Robinson M.D.   On: 03/28/2020 01:51   DG Chest Port 1 View  Result Date: 03/27/2020 CLINICAL DATA:  Questionable  sepsis.  Decreased responsiveness. EXAM: PORTABLE CHEST 1 VIEW COMPARISON:  02/27/2019 FINDINGS: Low lung volumes. Mild cardiomegaly. Minimal bibasilar densities, likely atelectasis. No effusions. No acute bony abnormality. IMPRESSION: Low lung volumes, bibasilar atelectasis. Electronically Signed   By: Charlett Nose M.D.   On: 03/27/2020 22:02      Lovena Kluck, Jonny Ruiz, MD 03/28/20 0202

## 2020-03-28 NOTE — ED Notes (Addendum)
Post CT upon assessment suprapubic catheter has been dislodged and has come out. Provider informed.

## 2020-03-29 DIAGNOSIS — G20 Parkinson's disease: Secondary | ICD-10-CM

## 2020-03-29 DIAGNOSIS — F0281 Dementia in other diseases classified elsewhere with behavioral disturbance: Secondary | ICD-10-CM

## 2020-03-29 DIAGNOSIS — N390 Urinary tract infection, site not specified: Secondary | ICD-10-CM

## 2020-03-29 DIAGNOSIS — U071 COVID-19: Principal | ICD-10-CM

## 2020-03-29 DIAGNOSIS — E440 Moderate protein-calorie malnutrition: Secondary | ICD-10-CM

## 2020-03-29 DIAGNOSIS — R410 Disorientation, unspecified: Secondary | ICD-10-CM

## 2020-03-29 DIAGNOSIS — S31000D Unspecified open wound of lower back and pelvis without penetration into retroperitoneum, subsequent encounter: Secondary | ICD-10-CM

## 2020-03-29 LAB — COMPREHENSIVE METABOLIC PANEL
ALT: 40 U/L (ref 0–44)
AST: 91 U/L — ABNORMAL HIGH (ref 15–41)
Albumin: 2.9 g/dL — ABNORMAL LOW (ref 3.5–5.0)
Alkaline Phosphatase: 41 U/L (ref 38–126)
Anion gap: 11 (ref 5–15)
BUN: 20 mg/dL (ref 8–23)
CO2: 22 mmol/L (ref 22–32)
Calcium: 8.1 mg/dL — ABNORMAL LOW (ref 8.9–10.3)
Chloride: 111 mmol/L (ref 98–111)
Creatinine, Ser: 0.62 mg/dL (ref 0.61–1.24)
GFR, Estimated: >60 mL/min (ref >60)
Glucose, Bld: 91 mg/dL (ref 70–99)
Potassium: 3.5 mmol/L (ref 3.5–5.1)
Sodium: 144 mmol/L (ref 135–145)
Total Bilirubin: 0.4 mg/dL (ref 0.3–1.2)
Total Protein: 6.6 g/dL (ref 6.5–8.1)

## 2020-03-29 LAB — CBC WITH DIFFERENTIAL/PLATELET
Abs Immature Granulocytes: 0.02 10*3/uL (ref 0.00–0.07)
Basophils Absolute: 0 10*3/uL (ref 0.0–0.1)
Basophils Relative: 1 %
Eosinophils Absolute: 0.1 10*3/uL (ref 0.0–0.5)
Eosinophils Relative: 2 %
HCT: 36.3 % — ABNORMAL LOW (ref 39.0–52.0)
Hemoglobin: 11.3 g/dL — ABNORMAL LOW (ref 13.0–17.0)
Immature Granulocytes: 1 %
Lymphocytes Relative: 41 %
Lymphs Abs: 1.6 10*3/uL (ref 0.7–4.0)
MCH: 29.8 pg (ref 26.0–34.0)
MCHC: 31.1 g/dL (ref 30.0–36.0)
MCV: 95.8 fL (ref 80.0–100.0)
Monocytes Absolute: 0.5 10*3/uL (ref 0.1–1.0)
Monocytes Relative: 12 %
Neutro Abs: 1.7 10*3/uL (ref 1.7–7.7)
Neutrophils Relative %: 43 %
Platelets: 259 10*3/uL (ref 150–400)
RBC: 3.79 MIL/uL — ABNORMAL LOW (ref 4.22–5.81)
RDW: 13.7 % (ref 11.5–15.5)
WBC: 4 10*3/uL (ref 4.0–10.5)
nRBC: 0 % (ref 0.0–0.2)

## 2020-03-29 LAB — C-REACTIVE PROTEIN
CRP: 5.2 mg/dL — ABNORMAL HIGH (ref <1.0)
CRP: 5.2 mg/dL — ABNORMAL HIGH (ref <1.0)

## 2020-03-29 LAB — VITAMIN B12: Vitamin B-12: 1546 pg/mL — ABNORMAL HIGH (ref 180–914)

## 2020-03-29 LAB — TSH: TSH: 1.462 u[IU]/mL (ref 0.350–4.500)

## 2020-03-29 LAB — FERRITIN: Ferritin: 166 ng/mL (ref 24–336)

## 2020-03-29 LAB — D-DIMER, QUANTITATIVE: D-Dimer, Quant: 0.53 ug/mL-FEU — ABNORMAL HIGH (ref 0.00–0.50)

## 2020-03-29 LAB — FOLATE: Folate: 38.3 ng/mL (ref >5.9)

## 2020-03-29 MED ORDER — RESOURCE THICKENUP CLEAR PO POWD
ORAL | Status: DC | PRN
  Filled 2020-03-29: qty 125

## 2020-03-29 NOTE — Evaluation (Signed)
Clinical/Bedside Swallow Evaluation Patient Details  Name: Mark Mathews MRN: [id number] Date of Birth: [birth year]  Today's Date: 03/29/2020 Time: SLP Start Time (ACUTE ONLY): 1225 SLP Stop Time (ACUTE ONLY): 1259 SLP Time Calculation (min) (ACUTE ONLY): 34 min  Past Medical History:  Past Medical History:  Diagnosis Date  . Other and unspecified hyperlipidemia   . Paralysis agitans (HCC)   . Parkinson's disease (HCC) 10/12/2012  . Spinal stenosis    Past Surgical History:  Past Surgical History:  Procedure Laterality Date  . APPENDECTOMY    . CATARACT EXTRACTION  2016   x3  . CYSTOSCOPY N/A 05/10/2019   Procedure: CYSTOSCOPY WITH SUPRA PUBIC TUBE PLACEMENT;  Surgeon: Ihor Gully, MD;  Location: [facility];  Service: Urology;  Laterality: N/A;  . INCISION AND DRAINAGE ABSCESS N/A 04/02/2018   Procedure: INCISION AND DRAINAGE debridement of sacral decubitis wound;  Surgeon: Andria Meuse, MD;  Location: [facility];  Service: General;  Laterality: N/A;  . INGUINAL HERNIA REPAIR Bilateral 03/06/2009  . KIDNEY SURGERY     HPI:  Mark Mathews is a 80 y.o. male with Parkinson's, chronic sacral decubitus ulcer presented to ED for altered mentation.   Assessment / Plan / Recommendation Clinical Impression  Pt currently on his side in bed with secretions covering his gown and drooling left sided.  Pt does not follow directions consistently nor answer SLP questions. ? Cognitive deficits and motor initiation due to his Parkinsons.  His voice is very weak and he does not cough or swallow on command.  SLP set up oral suction and used prior to po administration with difficulty due to pt not opening mouth fully.  Delayed swallow noted across all consistencies including puree and nectar = at times with pt requiring verbal cues to swallow.  Did not test thin nor solids due to his level of dysarthria/dysphagia from his neuro process.    Recommend to modify diet to puree/nectar with very strict  aspiration precautions.  Recommend palliative consult due to pt's poor po intake, premorbid wound, baseline dysphagia and current illness.  Pt will be aspiration risk regardless and mitigation is indicated. Advised pt to recommendations but uncertain to his comprehension.  Pt admits to premorbid coughing with intake = suspect some chronic aspiration at baseline.  He was able to articulate that he was at [facility].     No family present to educate/discuss premorbid status. SLP Visit Diagnosis: Dysphagia, oropharyngeal phase (R13.12)    Aspiration Risk  Severe aspiration risk;Risk for inadequate nutrition/hydration    Diet Recommendation Nectar-thick liquid;Dysphagia 1 (Puree)   Liquid Administration via: Cup;Spoon;No straw Medication Administration: Crushed with puree Compensations: Slow rate;Small sips/bites;Monitor for anterior loss;Other (Comment) (suction before and after po intake!) Postural Changes: Seated upright at 90 degrees;Remain upright for at least 30 minutes after po intake (upright as able)    Other  Recommendations Recommended Consults: Other (Comment) (palliative consult please) Oral Care Recommendations: Oral care before and after PO   Follow up Recommendations Other (comment) (TBD)      Frequency and Duration min 1 x/week          Prognosis Prognosis for Safe Diet Advancement: Guarded Barriers to Reach Goals: Cognitive deficits;Severity of deficits      Swallow Study   General HPI: Mark PETERSHEIM is a 80 y.o. male with Parkinson's, chronic sacral decubitus ulcer presented to ED for altered mentation. Type of Study: Bedside Swallow Evaluation Previous Swallow Assessment: h/o ugi 2001 reflux Diet Prior  to this Study: Dysphagia 3 (soft);Thin liquids Temperature Spikes Noted: No Respiratory Status: Room air History of Recent Intubation: No Behavior/Cognition: Alert;Doesn't follow directions Oral Cavity Assessment: Excessive secretions;Other  (comment) Oral Care Completed by SLP: Yes Oral Cavity - Dentition: Adequate natural dentition Vision: Impaired for self-feeding Self-Feeding Abilities: Total assist Patient Positioning: Other (comment) (elevated side lying) Baseline Vocal Quality: Hoarse;Suspected CN X (Vagus) involvement Volitional Cough: Cognitively unable to elicit    Oral/Motor/Sensory Function Overall Oral Motor/Sensory Function: Generalized oral weakness (masked facies, decreased labial seal on left, gross weakness) Velum:  (unable to view)   Ice Chips Ice chips: Not tested   Thin Liquid Thin Liquid: Not tested    Nectar Thick Nectar Thick Liquid: Impaired Presentation: Cup;Spoon;Self Fed Oral Phase Impairments: Reduced lingual movement/coordination;Poor awareness of bolus;Reduced labial seal Oral phase functional implications: Prolonged oral transit;Oral holding Pharyngeal Phase Impairments: Suspected delayed Swallow   Honey Thick Honey Thick Liquid: Impaired Presentation: Spoon Oral Phase Impairments: Reduced labial seal;Reduced lingual movement/coordination;Poor awareness of bolus Oral Phase Functional Implications: Prolonged oral transit;Oral holding Pharyngeal Phase Impairments: Suspected delayed Swallow   Puree Puree: Impaired Oral Phase Impairments: Reduced labial seal;Reduced lingual movement/coordination;Poor awareness of bolus Oral Phase Functional Implications: Prolonged oral transit Pharyngeal Phase Impairments: Suspected delayed Swallow   Solid     Solid: Not tested      Mark Mathews 03/29/2020,1:09 PM  Rolena Infante, MS [facility] SLP Acute Rehab Services Office [phone] Pager [phone]

## 2020-03-29 NOTE — Progress Notes (Addendum)
PROGRESS NOTE  Mark Mathews WPY:[id number] DOB: 08-Feb-1941 DOA: 03/27/2020 PCP: Patient, No Pcp Per   LOS: 1 day   Brief narrative:  Mark Mathews is a 80 y.o. male with Parkinson's disease, chronic sacral decubitus ulcer [facility] with altered mental status.  Patient currently lives with his family and is bedridden due to Parkinson's disease.  Family was concerned about worsening bedsore in the sacral region a potential infection since it was more open and draining.  Of note, patient was with hospice [facility] in the past but is not currently on hospice.  In the ED, patient was noted to be febrile with a temperature of 100.2 F and his blood blood pressure was borderline low.  Initial WBC was 4.1.  Urinalysis was abnormal with large leukocytes nitrites and RBC.-Urinalysis with large leukocytes nitrites WBCs and RBCs.  Lactic acid was 1.4.  Chest x-ray done in the ED showed low lung volumes and bibasilar atelectasis.  CT head and cervical spine without acute findings but for apical pneumonia.  CT abdomen and pelvis with decubitus ulceration.  Patient was given 500 mL of IV fluid bolus and IV Rocephin and was admitted to hospital for further evaluation and treatment.   Assessment/Plan:  Active Problems:   Parkinson's disease (HCC)   Sacral wound   Malnutrition of moderate degree   Dementia with behavioral disturbance (HCC)   Acute lower UTI   COVID-19 virus infection   Altered mental status  Altered mental status likely metabolic encephalopathy. Thought to be secondary to UTI.  Continue IV Rocephin, follow blood culture, urine culture. Normally he was cognitively fine until this episode until 4-5 days  Dysphagia.  Patient has been seen by speech and swallow evaluation.  Recommend dysphagia 1 diet.  Hopefully improve swallowing when his mentation improved.   Covid infection Patient is not hypoxic.  On IV remdesivir.  COVID-19 Labs  Recent Labs    03/29/20 0428  DDIMER  0.53*  FERRITIN 166  CRP 5.2*  5.2*    Parkinson's disease Continue Sinemet  Immobility resulting in sacral decubitus ulcer Continue wound care.  Failure to thrive. Continue supportive care.  Moderate Protein calorie malnutrition.  Encourage oral nutrition.   Pressure injury, bed bound status.  Mid sacral stage IV.  Present on admission.  Continue wound care.  Pressure Injury 01/16/19 Sacrum Posterior;Mid Stage III -  Full thickness tissue loss. Subcutaneous fat may be visible but bone, tendon or muscle are NOT exposed. (Active)  01/16/19 0800  Location: Sacrum  Location Orientation: Posterior;Mid  Staging: Stage III -  Full thickness tissue loss. Subcutaneous fat may be visible but bone, tendon or muscle are NOT exposed.  Wound Description (Comments):   Present on Admission: Yes     Pressure Injury 03/28/20 Sacrum Mid Stage 4 - Full thickness tissue loss with exposed bone, tendon or muscle. (Active)  03/28/20   Location: Sacrum  Location Orientation: Mid  Staging: Stage 4 - Full thickness tissue loss with exposed bone, tendon or muscle.  Wound Description (Comments):   Present on Admission: Yes   Debility, deconditioning.  Will get PT evaluation.  DVT prophylaxis: heparin injection 5,000 Units Start: 03/28/20 2200    Code Status: DNR  Family Communication:  I spoke with the patient's son on the phone and updated him about the clinical condition of the patient.   Status is: Inpatient  Remains inpatient appropriate because:IV treatments appropriate due to intensity of illness or inability to take PO and Inpatient level of  care appropriate due to severity of illness   Dispo: The patient is from: Home              Anticipated d/c is to: Home              Anticipated d/c date is: 2 days              Patient currently is not medically stable to d/c.   Difficult to place patient No   Consultants: None  Procedures: None  Anti-infectives:  Remdesivir  1/25> Unasyn iv 1/25>  Anti-infectives (From admission, onward)    Start     Dose/Rate Route Frequency Ordered Stop   03/29/20 1000  remdesivir 100 mg in sodium chloride 0.9 % 100 mL IVPB       "Followed by" Linked Group Details   100 mg 200 mL/hr over 30 Minutes Intravenous Daily 03/28/20 1911 04/02/20 0959   03/28/20 2000  remdesivir 200 mg in sodium chloride 0.9% 250 mL IVPB       "Followed by" Linked Group Details   200 mg 580 mL/hr over 30 Minutes Intravenous Once 03/28/20 1911 03/28/20 2335   03/28/20 0800  Ampicillin-Sulbactam (UNASYN) 3 g in sodium chloride 0.9 % 100 mL IVPB        3 g 200 mL/hr over 30 Minutes Intravenous Every 6 hours 03/28/20 0736     03/27/20 2215  cefTRIAXone (ROCEPHIN) 2 g in sodium chloride 0.9 % 100 mL IVPB        2 g 200 mL/hr over 30 Minutes Intravenous  Once 03/27/20 2201 03/27/20 2251       Subjective: Today, patient was seen and examined at bedside.  Patient is answering yes/no questions.  Not much interactive. At baseline able to talk and communicative.   Objective: Vitals:   03/29/20 0433 03/29/20 1418  BP: (!) 107/91 (!) 150/78  Pulse: 81 72  Resp: 17 20  Temp: (!) 97.3 F (36.3 C) 99.3 F (37.4 C)  SpO2: 91% 93%    Intake/Output Summary (Last 24 hours) at 03/29/2020 1536 Last data filed at 03/29/2020 1535 Gross per 24 hour  Intake 1016 ml  Output 800 ml  Net 216 ml   Filed Weights   03/27/20 2104  Weight: 61.4 kg   Body mass index is 20.58 kg/m.   Physical Exam: GENERAL: Patient is alert awake and answering yes or no questions, Not in obvious distress.  Thinly built, chronically ill, flexed posture HENT: No scleral pallor or icterus. Pupils equally reactive to light. Oral mucosa is moist NECK: is supple, no gross swelling noted. CHEST: Clear to auscultation. No crackles or wheezes.  Diminished breath sounds bilaterally. CVS: S1 and S2 heard, no murmur. Regular rate and rhythm.  ABDOMEN: Soft, non-tender, bowel sounds  are present. EXTREMITIES: No edema. CNS: Moving extremities, flat affect SKIN: Stage IV sacral decubitus ulceration present on admission  Data Review: I have personally reviewed the following laboratory data and studies,  CBC: Recent Labs  Lab 03/27/20 2143 03/29/20 0428  WBC 4.1 4.0  NEUTROABS 2.1 1.7  HGB 11.6* 11.3*  HCT 35.9* 36.3*  MCV 93.7 95.8  PLT 213 259   Basic Metabolic Panel: Recent Labs  Lab 03/27/20 2143 03/29/20 0428  NA 141 144  K 3.8 3.5  CL 109 111  CO2 22 22  GLUCOSE 126* 91  BUN 29* 20  CREATININE 0.70 0.62  CALCIUM 8.2* 8.1*   Liver Function Tests: Recent Labs  Lab 03/27/20 2143 03/29/20  0428  AST 97* 91*  ALT 16 40  ALKPHOS 41 41  BILITOT 0.3 0.4  PROT 6.8 6.6  ALBUMIN 2.9* 2.9*   No results for input(s): LIPASE, AMYLASE in the last 168 hours. No results for input(s): AMMONIA in the last 168 hours. Cardiac Enzymes: No results for input(s): CKTOTAL, CKMB, CKMBINDEX, TROPONINI in the last 168 hours. BNP (last 3 results) No results for input(s): BNP in the last 8760 hours.  ProBNP (last 3 results) No results for input(s): PROBNP in the last 8760 hours.  CBG: No results for input(s): GLUCAP in the last 168 hours. Recent Results (from the past 240 hour(s))  Blood Culture (routine x 2)     Status: None (Preliminary result)   Collection Time: 03/27/20  9:44 PM   Specimen: BLOOD  Result Value Ref Range Status   Specimen Description   Final    BLOOD BLOOD LEFT WRIST Performed at [facility], [address] [place], [place] [address]    Special Requests   Final    BOTTLES DRAWN AEROBIC AND ANAEROBIC Blood Culture adequate volume Performed at [facility], [address], [place], [place] [address]    Culture   Final    NO GROWTH 1 DAY Performed at [facility], [address], [place], [place] [address]    Report Status PENDING  Incomplete  Blood Culture (routine x 2)     Status: None  (Preliminary result)   Collection Time: 03/27/20  9:49 PM   Specimen: BLOOD  Result Value Ref Range Status   Specimen Description   Final    BLOOD BLOOD RIGHT FOREARM Performed at [facility], [address], [place], [place] [address]    Special Requests   Final    BOTTLES DRAWN AEROBIC AND ANAEROBIC Blood Culture adequate volume Performed at [facility], [address] [place], [place] [address]    Culture   Final    NO GROWTH 1 DAY Performed at [facility], [address], [place], [place] [address]    Report Status PENDING  Incomplete  Urine culture     Status: Abnormal (Preliminary result)   Collection Time: 03/27/20 11:13 PM   Specimen: In/Out Cath Urine  Result Value Ref Range Status   Specimen Description   Final    IN/OUT CATH URINE Performed at [facility], [address] [place], [place] [address]    Special Requests   Final    NONE Performed at [facility], [address] [place], [place] [address]    Culture (A)  Final    >=100,000 COLONIES/mL STAPHYLOCOCCUS AUREUS >=100,000 COLONIES/mL PSEUDOMONAS PUTIDA SUSCEPTIBILITIES TO FOLLOW Performed at [facility], [address] [place], [place] [address]    Report Status PENDING  Incomplete     Studies: CT ABDOMEN PELVIS WO CONTRAST  Result Date: 03/28/2020 CLINICAL DATA:  Hematuria and fevers with soft tissue swelling surrounding suprapubic catheter EXAM: CT ABDOMEN AND PELVIS WITHOUT CONTRAST TECHNIQUE: Multidetector CT imaging of the abdomen and pelvis was performed following the standard protocol without IV contrast. COMPARISON:  01/15/2019 FINDINGS: Lower chest: Bibasilar atelectatic changes are seen. No sizable effusion is noted. These changes are likely in part related to the known history of COVID-19 positivity. Hepatobiliary: No focal liver abnormality is seen. No gallstones, gallbladder wall thickening, or  biliary dilatation. Pancreas: Mild fatty interdigitation of the pancreas is seen. No focal mass is  noted. Spleen: Normal in size without focal abnormality. Adrenals/Urinary Tract: Adrenal glands are within normal limits. Left kidney demonstrates a prominent extrarenal pelvis similar to that seen on the prior exam. Prominent extrarenal pelvis on the right is noted as well. Tiny nonobstructing right renal stone is seen. Ureters are within normal limits. The bladder is decompressed with mild wall thickening related to the decompressive nature. By history a suprapubic catheter is present although no definitive catheter is seen. Mild induration at the previous catheter site is seen without abscess formation. Stomach/Bowel: Prominent fecal material within the rectal vault is noted although no obstructive changes are seen. No significant constipation is noted. The appendix is not visualized consistent with prior surgical history. Stomach and small bowel are within normal limits. Vascular/Lymphatic: Aortic atherosclerosis. No enlarged abdominal or pelvic lymph nodes. Reproductive: Prostate is unremarkable. Other: No abdominal wall hernia or abnormality. No abdominopelvic ascites. Musculoskeletal: Decubitus ulcer is noted posteriorly. No definitive bony erosive changes are identified to suggest osteomyelitis. No focal abscess is seen. Degenerative changes of lumbosacral spine are noted. IMPRESSION: No definitive findings to suggest sacral osteomyelitis. A decubitus ulcer is seen. Bibasilar atelectatic changes. This may be related to the underlying COVID-19 positivity. Tiny nonobstructing stone. By history, a suprapubic catheter is present although no catheter is identified on this exam. Some induration at the previous catheter site is noted. Clinical correlation is recommended. Mild prominent fecal material within the rectal wall without obstructive change. Electronically Signed   By: Alcide Clever M.D.   On: 03/28/2020 08:53    CT Head Wo Contrast  Result Date: 03/28/2020 CLINICAL DATA:  Encephalopathy.  COVID-19. EXAM: CT HEAD WITHOUT CONTRAST CT CERVICAL SPINE WITHOUT CONTRAST TECHNIQUE: Multidetector CT imaging of the head and cervical spine was performed following the standard protocol without intravenous contrast. Multiplanar CT image reconstructions of the cervical spine were also generated. COMPARISON:  None. FINDINGS: CT HEAD FINDINGS Brain: There is no mass, hemorrhage or extra-axial collection. There is generalized atrophy without lobar predilection. There is hypoattenuation of the periventricular white matter, most commonly indicating chronic ischemic microangiopathy. Vascular: No abnormal hyperdensity of the major intracranial arteries or dural venous sinuses. No intracranial atherosclerosis. Skull: The visualized skull base, calvarium and extracranial soft tissues are normal. Sinuses/Orbits: Bilateral mastoid fluid.  The orbits are normal. CT CERVICAL SPINE FINDINGS Alignment: No static subluxation. Facets are aligned. Occipital condyles are normally positioned. Skull base and vertebrae: No acute fracture. Soft tissues and spinal canal: No prevertebral fluid or swelling. No visible canal hematoma. Disc levels: No advanced spinal canal or neural foraminal stenosis. Upper chest: Biapical opacities. Other: Normal visualized paraspinal cervical soft tissues. IMPRESSION: 1. No acute intracranial abnormality. 2. No acute fracture or static subluxation of the cervical spine. 3. Bilateral mastoid fluid. 4. Biapical pneumonia. Electronically Signed   By: Deatra Robinson M.D.   On: 03/28/2020 01:51   CT Cervical Spine Wo Contrast  Result Date: 03/28/2020 CLINICAL DATA:  Encephalopathy.  COVID-19. EXAM: CT HEAD WITHOUT CONTRAST CT CERVICAL SPINE WITHOUT CONTRAST TECHNIQUE: Multidetector CT imaging of the head and cervical spine was performed following the standard protocol without intravenous contrast. Multiplanar CT image  reconstructions of the cervical spine were also generated. COMPARISON:  None. FINDINGS: CT HEAD FINDINGS Brain: There is no mass, hemorrhage or extra-axial collection. There is generalized atrophy without lobar predilection. There is hypoattenuation of the periventricular white matter, most commonly indicating chronic ischemic microangiopathy. Vascular: No abnormal hyperdensity of the major intracranial arteries or dural venous sinuses. No intracranial  atherosclerosis. Skull: The visualized skull base, calvarium and extracranial soft tissues are normal. Sinuses/Orbits: Bilateral mastoid fluid.  The orbits are normal. CT CERVICAL SPINE FINDINGS Alignment: No static subluxation. Facets are aligned. Occipital condyles are normally positioned. Skull base and vertebrae: No acute fracture. Soft tissues and spinal canal: No prevertebral fluid or swelling. No visible canal hematoma. Disc levels: No advanced spinal canal or neural foraminal stenosis. Upper chest: Biapical opacities. Other: Normal visualized paraspinal cervical soft tissues. IMPRESSION: 1. No acute intracranial abnormality. 2. No acute fracture or static subluxation of the cervical spine. 3. Bilateral mastoid fluid. 4. Biapical pneumonia. Electronically Signed   By: Deatra Robinson M.D.   On: 03/28/2020 01:51   DG Chest Port 1 View  Result Date: 03/27/2020 CLINICAL DATA:  Questionable sepsis.  Decreased responsiveness. EXAM: PORTABLE CHEST 1 VIEW COMPARISON:  02/27/2019 FINDINGS: Low lung volumes. Mild cardiomegaly. Minimal bibasilar densities, likely atelectasis. No effusions. No acute bony abnormality. IMPRESSION: Low lung volumes, bibasilar atelectasis. Electronically Signed   By: Charlett Nose M.D.   On: 03/27/2020 22:02      Joycelyn Das, MD  [facility] 03/29/2020  If 7PM-7AM, please contact night-coverage

## 2020-03-29 NOTE — Progress Notes (Signed)
BSE completed, full report to follow.  Pt currently on his side in bed with secretions covering his gown and drooling left sided.  Pt does not follow directions consistently nor answer SLP questions. ? Cognitive deficits and motor initiation due to his Parkinsons.  His voice is very weak and he does not cough or swallow on command.  SLP set up oral suction and used prior to po administration with diffiuclty due to pt not opening mouth fully.  Delayed swallow noted across all consistencies including puree and nectar = at times with pt requiring verbal cues to swallow.  Did not test thin nor solids due to his level of dysarthria/dysphagia from his neuro process.  Recommend to modify diet to puree/nectar with very strict aspiration precautions.  Recommend palliative consult due to pt's poor po intake, premorbid wound, baseline dysphagia and current illness.  Pt will be aspiration risk regardless and mitigation is indicated. Advised pt to recommendations but uncertain to his comprehension.  Pt admits to premorbid coughing with intake = suspect some chronic aspiration at baseline.  He was able to articulate that he was at [facility].   No family present to educate/discuss premorbid status.    Rolena Infante, MS [facility] SLP Acute Rehab Services Office [phone] Pager [phone]

## 2020-03-29 NOTE — Plan of Care (Signed)

## 2020-03-30 LAB — COMPREHENSIVE METABOLIC PANEL
ALT: 11 U/L (ref 0–44)
AST: 62 U/L — ABNORMAL HIGH (ref 15–41)
Albumin: 2.9 g/dL — ABNORMAL LOW (ref 3.5–5.0)
Alkaline Phosphatase: 40 U/L (ref 38–126)
Anion gap: 10 (ref 5–15)
BUN: 28 mg/dL — ABNORMAL HIGH (ref 8–23)
CO2: 21 mmol/L — ABNORMAL LOW (ref 22–32)
Calcium: 8 mg/dL — ABNORMAL LOW (ref 8.9–10.3)
Chloride: 110 mmol/L (ref 98–111)
Creatinine, Ser: 0.59 mg/dL — ABNORMAL LOW (ref 0.61–1.24)
GFR, Estimated: >60 mL/min (ref >60)
Glucose, Bld: 110 mg/dL — ABNORMAL HIGH (ref 70–99)
Potassium: 3 mmol/L — ABNORMAL LOW (ref 3.5–5.1)
Sodium: 141 mmol/L (ref 135–145)
Total Bilirubin: 0.5 mg/dL (ref 0.3–1.2)
Total Protein: 6.7 g/dL (ref 6.5–8.1)

## 2020-03-30 LAB — GLUCOSE, CAPILLARY: Glucose-Capillary: 130 mg/dL — ABNORMAL HIGH (ref 70–99)

## 2020-03-30 LAB — CBC WITH DIFFERENTIAL/PLATELET
Abs Immature Granulocytes: 0.03 10*3/uL (ref 0.00–0.07)
Basophils Absolute: 0 10*3/uL (ref 0.0–0.1)
Basophils Relative: 0 %
Eosinophils Absolute: 0 10*3/uL (ref 0.0–0.5)
Eosinophils Relative: 0 %
HCT: 34.8 % — ABNORMAL LOW (ref 39.0–52.0)
Hemoglobin: 11.3 g/dL — ABNORMAL LOW (ref 13.0–17.0)
Immature Granulocytes: 1 %
Lymphocytes Relative: 35 %
Lymphs Abs: 1.7 10*3/uL (ref 0.7–4.0)
MCH: 30.1 pg (ref 26.0–34.0)
MCHC: 32.5 g/dL (ref 30.0–36.0)
MCV: 92.8 fL (ref 80.0–100.0)
Monocytes Absolute: 0.6 10*3/uL (ref 0.1–1.0)
Monocytes Relative: 13 %
Neutro Abs: 2.4 10*3/uL (ref 1.7–7.7)
Neutrophils Relative %: 51 %
Platelets: 277 10*3/uL (ref 150–400)
RBC: 3.75 MIL/uL — ABNORMAL LOW (ref 4.22–5.81)
RDW: 13.4 % (ref 11.5–15.5)
WBC: 4.8 10*3/uL (ref 4.0–10.5)
nRBC: 0 % (ref 0.0–0.2)

## 2020-03-30 LAB — FERRITIN: Ferritin: 167 ng/mL (ref 24–336)

## 2020-03-30 LAB — C-REACTIVE PROTEIN: CRP: 4.9 mg/dL — ABNORMAL HIGH (ref <1.0)

## 2020-03-30 LAB — D-DIMER, QUANTITATIVE: D-Dimer, Quant: 0.55 ug/mL-FEU — ABNORMAL HIGH (ref 0.00–0.50)

## 2020-03-30 MED ORDER — JUVEN PO PACK
1.0000 | PACK | Freq: Two times a day (BID) | ORAL | Status: DC
  Administered 2020-04-03: 1 via ORAL
  Filled 2020-03-30 (×9): qty 1

## 2020-03-30 NOTE — Progress Notes (Signed)
Son was provided an update on patient.

## 2020-03-30 NOTE — Progress Notes (Signed)
Initial Nutrition Assessment  INTERVENTION:   -Hormel Shake BID, each provides 520 kcals and 22g protein  -Juven Fruit Punch BID, each serving provides 95kcal and 2.5g of protein (amino acids glutamine and arginine)  -Multivitamin with minerals daily  NUTRITION DIAGNOSIS:   Increased nutrient needs related to wound healing as evidenced by estimated needs.  GOAL:   Patient will meet greater than or equal to 90% of their needs  MONITOR:   PO intake,Supplement acceptance,Labs,Weight trends,I & O's  REASON FOR ASSESSMENT:   Consult Assessment of nutrition requirement/status  ASSESSMENT:   80 y.o. male with Parkinson's disease, chronic sacral decubitus [facility] with altered mental status.  Patient currently lives with his family and is bedridden due to Parkinson's disease.  Family was concerned about worsening bedsore in the sacral region a potential infection since it was more open and draining.  Of note, patient was with hospice [facility] in the past but is not currently on hospice.  COVID+ since 1/24  Pt with AMS, currently alert/oriented x 1. Per chart review, pt ate 15% of lunch.  SLP evaluated 1/26, recommended dysphagia 1 diet with nectar thick liquids d/t severe aspiration risk.   Will order nectar thick protein supplements with meals as well as Juven to aid in wound healing.  Per weight records, no weight loss noted.  Medications: Vitamin D, Multivitamin with minerals daily  Labs reviewed: Low K  NUTRITION - FOCUSED PHYSICAL EXAM:  Unable to complete  Diet Order:   Diet Order            DIET - DYS 1 Room service appropriate? Yes; Fluid consistency: Nectar Thick  Diet effective now                 EDUCATION NEEDS:   No education needs have been identified at this time  Skin:  Skin Assessment: Skin Integrity Issues: Skin Integrity Issues:: Stage IV Stage IV: sacrum  Last BM:  1/27 -type 6  Height:   Ht Readings from Last 1  Encounters:  03/27/20 5\' 8"  (1.727 m)    Weight:   Wt Readings from Last 1 Encounters:  03/27/20 61.4 kg   BMI:  Body mass index is 20.58 kg/m.  Estimated Nutritional Needs:   Kcal:  1800-2000  Protein:  90-100g  Fluid:  2L/day  03/29/20, MS, RD, LDN Inpatient Clinical Dietitian Contact information available via Amion

## 2020-03-30 NOTE — Progress Notes (Signed)
PROGRESS NOTE  [name]:[id number] DOB: [birth year] DOA: 03/27/2020 PCP: Patient, No Pcp Per   LOS: 2 days   Brief narrative:  Mark Mathews is a 80 y.o. male with Parkinson's disease, chronic sacral decubitus ulcer presented to the hospital with altered mental status.  Patient currently lives with his family and is bedridden due to Parkinson's disease.  Family was concerned about worsening bedsore in the sacral region a potential infection since it was more open and draining.  Of note, patient was with hospice [facility] in the past but is not currently on hospice.  In the ED, patient was noted to be febrile with a temperature of 100.2 F and his blood blood pressure was borderline low.  Initial WBC was 4.1.  Urinalysis was abnormal with large leukocytes nitrites and RBC.-Urinalysis with large leukocytes nitrites WBCs and RBCs.  Lactic acid was 1.4.  Chest x-ray done in the ED showed low lung volumes and bibasilar atelectasis.  CT head and cervical spine without acute findings but for apical pneumonia.  CT abdomen and pelvis with decubitus ulceration.  Patient was given 500 mL of IV fluid bolus and IV Rocephin and was admitted to hospital for further evaluation and treatment.   Assessment/Plan:  Active Problems:   Parkinson's disease (HCC)   Sacral wound   Malnutrition of moderate degree   Dementia with behavioral disturbance (HCC)   Acute lower UTI   COVID-19 virus infection   Altered mental status  Altered mental status likely metabolic encephalopathy. Thought to be secondary to UTI.  Continue IV Unasyn, urine culture showing multiple organisms of staph aureus, Pseudomonas.  Blood culture negative in 1 day.  As per the family normally he was cognitively fine until this episode until 4-5 days prior to presentation.  Dysphagia.  History of Parkinson's disease with current encephalopathy.  Patient has been seen by speech and swallow evaluation on 03/29/2020.  Recommend dysphagia 1  diet.  Hopefully improve swallowing when his mentation improved.    Covid infection Patient is not hypoxic.  On IV remdesivir.  COVID-19 Labs  Recent Labs    03/29/20 0428 03/30/20 0406  DDIMER 0.53* 0.55*  FERRITIN 166 167  CRP 5.2*  5.2* 4.9*    Parkinson's disease Continue Sinemet  Immobility resulting in sacral decubitus ulcer Continue wound care.  Failure to thrive. Continue supportive care.  Moderate Protein calorie malnutrition.  Encourage oral nutrition.  Has not been able to take much orally.  Nutrition on board.  Hypokalemia.  Due to poor oral intake.  Will replenish potassium through IV since he is not taking much orally.  Pressure injury, bed bound status.  Mid sacral stage IV.  Present on admission.  Continue wound care.  Pressure Injury 01/16/19 Sacrum Posterior;Mid Stage III -  Full thickness tissue loss. Subcutaneous fat may be visible but bone, tendon or muscle are NOT exposed. (Active)  01/16/19 0800  Location: Sacrum  Location Orientation: Posterior;Mid  Staging: Stage III -  Full thickness tissue loss. Subcutaneous fat may be visible but bone, tendon or muscle are NOT exposed.  Wound Description (Comments):   Present on Admission: Yes     Pressure Injury 03/28/20 Sacrum Mid Stage 4 - Full thickness tissue loss with exposed bone, tendon or muscle. (Active)  03/28/20   Location: Sacrum  Location Orientation: Mid  Staging: Stage 4 - Full thickness tissue loss with exposed bone, tendon or muscle.  Wound Description (Comments):   Present on Admission: Yes   Generalized debility,  deconditioning.  Will get PT evaluation.  Might need physical therapy on discharge.  DVT prophylaxis: heparin injection 5,000 Units Start: 03/28/20 2200   Code Status: DNR  Family Communication: None today.  I spoke with the patient's son on the phone and updated him about the clinical condition of the patient today.   Status is: Inpatient  Remains inpatient  appropriate because:IV treatments appropriate due to intensity of illness or inability to take PO and Inpatient level of care appropriate due to severity of illness   Dispo: The patient is from: Home              Anticipated d/c is to: Home with home health/skilled nursing facility, will get PT evaluation              Anticipated d/c date is: 2 days              Patient currently is not medically stable to d/c.   Difficult to place patient No   Consultants:  None  Procedures:  None  Anti-infectives:  . Remdesivir 1/25> . Unasyn iv 1/25>  Anti-infectives (From admission, onward)   Start     Dose/Rate Route Frequency Ordered Stop   03/29/20 1000  remdesivir 100 mg in sodium chloride 0.9 % 100 mL IVPB       "Followed by" Linked Group Details   100 mg 200 mL/hr over 30 Minutes Intravenous Daily 03/28/20 1911 04/02/20 0959   03/28/20 2000  remdesivir 200 mg in sodium chloride 0.9% 250 mL IVPB       "Followed by" Linked Group Details   200 mg 580 mL/hr over 30 Minutes Intravenous Once 03/28/20 1911 03/28/20 2335   03/28/20 0800  Ampicillin-Sulbactam (UNASYN) 3 g in sodium chloride 0.9 % 100 mL IVPB        3 g 200 mL/hr over 30 Minutes Intravenous Every 6 hours 03/28/20 0736     03/27/20 2215  cefTRIAXone (ROCEPHIN) 2 g in sodium chloride 0.9 % 100 mL IVPB        2 g 200 mL/hr over 30 Minutes Intravenous  Once 03/27/20 2201 03/27/20 2251     Subjective: Today, patient was seen and examined at bedside.  Patient has been reported to have less p.o. intake.  Difficulty with swallowing.  Not a good historian.  Denies symptoms.  Not much interactive. At baseline able to talk and communicative.   Objective: Vitals:   03/29/20 2029 03/30/20 0427  BP: 117/75 111/68  Pulse: 81 89  Resp: (!) 23 20  Temp: 99.9 F (37.7 C) 98.7 F (37.1 C)  SpO2: 91% 91%    Intake/Output Summary (Last 24 hours) at 03/30/2020 0852 Last data filed at 03/30/2020 0456 Gross per 24 hour  Intake 466 ml   Output 950 ml  Net -484 ml   Filed Weights   03/27/20 2104  Weight: 61.4 kg   Body mass index is 20.58 kg/m.   Physical Exam: GENERAL: Patient is alert, awake and minimally interactive, not in obvious distress.  Thinly built, chronically ill, flexed posture HENT: No scleral pallor or icterus. Pupils equally reactive to light. Oral mucosa is moist NECK: is supple, no gross swelling noted. CHEST: Clear to auscultation. No crackles or wheezes.  Diminished breath sounds bilaterally. CVS: S1 and S2 heard, no murmur. Regular rate and rhythm.  ABDOMEN: Soft, non-tender, bowel sounds are present. EXTREMITIES: No edema. CNS: Moving extremities, flat affect, involuntary tremors of the upper extremities SKIN: Stage IV sacral decubitus ulceration  present on admission  Data Review: I have personally reviewed the following laboratory data and studies,  CBC: Recent Labs  Lab 03/27/20 2143 03/29/20 0428 03/30/20 0406  WBC 4.1 4.0 4.8  NEUTROABS 2.1 1.7 2.4  HGB 11.6* 11.3* 11.3*  HCT 35.9* 36.3* 34.8*  MCV 93.7 95.8 92.8  PLT 213 259 277   Basic Metabolic Panel: Recent Labs  Lab 03/27/20 2143 03/29/20 0428 03/30/20 0406  NA 141 144 141  K 3.8 3.5 3.0*  CL 109 111 110  CO2 22 22 21*  GLUCOSE 126* 91 110*  BUN 29* 20 28*  CREATININE 0.70 0.62 0.59*  CALCIUM 8.2* 8.1* 8.0*   Liver Function Tests: Recent Labs  Lab 03/27/20 2143 03/29/20 0428 03/30/20 0406  AST 97* 91* 62*  ALT 16 40 11  ALKPHOS 41 41 40  BILITOT 0.3 0.4 0.5  PROT 6.8 6.6 6.7  ALBUMIN 2.9* 2.9* 2.9*   No results for input(s): LIPASE, AMYLASE in the last 168 hours. No results for input(s): AMMONIA in the last 168 hours. Cardiac Enzymes: No results for input(s): CKTOTAL, CKMB, CKMBINDEX, TROPONINI in the last 168 hours. BNP (last 3 results) No results for input(s): BNP in the last 8760 hours.  ProBNP (last 3 results) No results for input(s): PROBNP in the last 8760 hours.  CBG: No results for  input(s): GLUCAP in the last 168 hours. Recent Results (from the past 240 hour(s))  Blood Culture (routine x 2)     Status: None (Preliminary result)   Collection Time: 03/27/20  9:44 PM   Specimen: BLOOD  Result Value Ref Range Status   Specimen Description   Final    BLOOD BLOOD LEFT WRIST Performed at [facility], [address] [place], [place] [address]    Special Requests   Final    BOTTLES DRAWN AEROBIC AND ANAEROBIC Blood Culture adequate volume Performed at [facility], [address] [place], [place] [address]    Culture   Final    NO GROWTH 1 DAY Performed at [facility], [address], [place], [place] [address]    Report Status PENDING  Incomplete  Blood Culture (routine x 2)     Status: None (Preliminary result)   Collection Time: 03/27/20  9:49 PM   Specimen: BLOOD  Result Value Ref Range Status   Specimen Description   Final    BLOOD BLOOD RIGHT FOREARM Performed at [facility], [address] [place], [place] [address]    Special Requests   Final    BOTTLES DRAWN AEROBIC AND ANAEROBIC Blood Culture adequate volume Performed at [facility], [address], [place], [place] [address]    Culture   Final    NO GROWTH 1 DAY Performed at [facility], [address] [place], [place] [address]    Report Status PENDING  Incomplete  Urine culture     Status: Abnormal (Preliminary result)   Collection Time: 03/27/20 11:13 PM   Specimen: In/Out Cath Urine  Result Value Ref Range Status   Specimen Description   Final    IN/OUT CATH URINE Performed at [facility], [address], [place], [place] [address]    Special Requests   Final    NONE Performed at [facility], [address] [place], [place] [address]    Culture (A)  Final    >=100,000 COLONIES/mL STAPHYLOCOCCUS AUREUS >=100,000 COLONIES/mL PSEUDOMONAS PUTIDA REPEATING  SUSCEPTIBILITY FOR STAPH AUREUS Performed at [facility], [address] [place], [place] [address]    Report Status PENDING  Incomplete   Organism ID, Bacteria PSEUDOMONAS PUTIDA (A)  Final      Susceptibility   Pseudomonas putida - MIC*    CEFTAZIDIME 8 SENSITIVE Sensitive     CIPROFLOXACIN >=4 RESISTANT Resistant     GENTAMICIN >=16 RESISTANT Resistant     IMIPENEM >=16 RESISTANT Resistant     PIP/TAZO 16 SENSITIVE Sensitive     * >=100,000 COLONIES/mL PSEUDOMONAS PUTIDA     Studies: No results found.    Joycelyn Das, MD  [facility] 03/30/2020  If 7PM-7AM, please contact night-coverage

## 2020-03-30 NOTE — Progress Notes (Signed)
PT Screen Note  Patient Details Name: Mark Mathews MRN: [id number] DOB: 07-05-1940   PT orders received and chart reviewed.  Noted that pt from home and bedbound. Also, noted pt with Parkinsons and was on hospice services at some point, but is no longer. Spoke with RN who confirmed pt bed bound, contracted, and with sacral wound. She reports he is very painful even with rolling.  Pt does not have skilled PT needs and is unfortunately at his baseline mobility.  Recommend offloading and bed mobility with nursing staff. Anise Salvo, PT Acute Rehab Services Pager [phone] [facility] [phone]     Rayetta Humphrey 03/30/2020, 2:58 PM

## 2020-03-30 NOTE — Progress Notes (Signed)
Strands of Blood clots are noted in patients foley tubing.  Urine is amber in color with red streaks, will monitor

## 2020-03-30 NOTE — Progress Notes (Signed)
  Speech Language Pathology Treatment: Dysphagia  Patient Details Name: Mark Mathews MRN: [id number] DOB: 1940-12-29 Today's Date: 03/30/2020 Time: 2440-1027 SLP Time Calculation (min) (ACUTE ONLY): 20 min  Assessment / Plan / Recommendation Clinical Impression  Pt allowed SlP to brush his teeth with toothbrush and use suction to orally clear retained secretions. He demonstrates signficant difficulty opening his oral cavity and his dysrthric but with max cues opens enough to allow oral suction catheter in between dentition. Pt willing to accept intake of magic cup, nectar tea and thin water.  Swallow continues to be delayed but more efficient that previous date.  After completion of "snack" - approx 1 ounce magic cup, 1 ounce tea, pt wanted water.  Delayed cough after 4th bolus of thin water via cup- strong reflexive cough noted with pt being able to propel secretions into oral cavity and allowed SLP to orally suction.     Baseline, pt's voice is weak however and suspect some chronic low grade aspiration of secretions. Encouraged pt to cough and expectorate if reflexiively needs to cough *left washcloth on pt's chest* to which he stated he "will try".  No indication of aspiration or penetration with nectar thick tea nor magic cup.  Pt much more responsive today than yesterday - answering questions with very weak voice but with 85% of opportunities.  Given pt's baseline dysphagia likely impacting his nutritional status (wound present), continue to recommend palliative consult to establish goals of care.  HPI   Mark Mathews is a 80 y.o. male with Parkinson's, chronic sacral decubitus ulcer presented to ED for altered mentation.     SLP Plan  Continue with current plan of care       Recommendations  Diet recommendations: Nectar-thick liquid;Dysphagia 1 (puree) (thin water between meals after mouth care) Medication Administration: Crushed with puree Compensations: Slow rate;Small  sips/bites;Monitor for anterior loss;Other (Comment) (suction before and after po intake!) Postural Changes and/or Swallow Maneuvers: Seated upright 90 degrees;Upright 30-60 min after meal                Oral Care Recommendations: Oral care before and after PO Follow up Recommendations: Other (comment) (TBD) SLP Visit Diagnosis: Dysphagia, oropharyngeal phase (R13.12) Plan: Continue with current plan of care       GO                [name] 03/30/2020, 2:31 PM   Rolena Infante, MS [facility] SLP Acute Rehab Services Office [phone] Pager [phone]

## 2020-03-31 LAB — CBC WITH DIFFERENTIAL/PLATELET
Abs Immature Granulocytes: 0.04 10*3/uL (ref 0.00–0.07)
Basophils Absolute: 0 10*3/uL (ref 0.0–0.1)
Basophils Relative: 0 %
Eosinophils Absolute: 0.1 10*3/uL (ref 0.0–0.5)
Eosinophils Relative: 1 %
HCT: 34.7 % — ABNORMAL LOW (ref 39.0–52.0)
Hemoglobin: 11.1 g/dL — ABNORMAL LOW (ref 13.0–17.0)
Immature Granulocytes: 1 %
Lymphocytes Relative: 28 %
Lymphs Abs: 1.3 10*3/uL (ref 0.7–4.0)
MCH: 29.7 pg (ref 26.0–34.0)
MCHC: 32 g/dL (ref 30.0–36.0)
MCV: 92.8 fL (ref 80.0–100.0)
Monocytes Absolute: 0.7 10*3/uL (ref 0.1–1.0)
Monocytes Relative: 15 %
Neutro Abs: 2.6 10*3/uL (ref 1.7–7.7)
Neutrophils Relative %: 55 %
Platelets: 287 10*3/uL (ref 150–400)
RBC: 3.74 MIL/uL — ABNORMAL LOW (ref 4.22–5.81)
RDW: 13.4 % (ref 11.5–15.5)
WBC: 4.7 10*3/uL (ref 4.0–10.5)
nRBC: 0 % (ref 0.0–0.2)

## 2020-03-31 LAB — COMPREHENSIVE METABOLIC PANEL
ALT: 13 U/L (ref 0–44)
AST: 43 U/L — ABNORMAL HIGH (ref 15–41)
Albumin: 2.7 g/dL — ABNORMAL LOW (ref 3.5–5.0)
Alkaline Phosphatase: 36 U/L — ABNORMAL LOW (ref 38–126)
Anion gap: 8 (ref 5–15)
BUN: 24 mg/dL — ABNORMAL HIGH (ref 8–23)
CO2: 25 mmol/L (ref 22–32)
Calcium: 8.3 mg/dL — ABNORMAL LOW (ref 8.9–10.3)
Chloride: 112 mmol/L — ABNORMAL HIGH (ref 98–111)
Creatinine, Ser: 0.43 mg/dL — ABNORMAL LOW (ref 0.61–1.24)
GFR, Estimated: >60 mL/min (ref >60)
Glucose, Bld: 135 mg/dL — ABNORMAL HIGH (ref 70–99)
Potassium: 2.9 mmol/L — ABNORMAL LOW (ref 3.5–5.1)
Sodium: 145 mmol/L (ref 135–145)
Total Bilirubin: 0.4 mg/dL (ref 0.3–1.2)
Total Protein: 6.4 g/dL — ABNORMAL LOW (ref 6.5–8.1)

## 2020-03-31 LAB — C-REACTIVE PROTEIN: CRP: 5.8 mg/dL — ABNORMAL HIGH (ref <1.0)

## 2020-03-31 LAB — D-DIMER, QUANTITATIVE: D-Dimer, Quant: 0.45 ug/mL-FEU (ref 0.00–0.50)

## 2020-03-31 LAB — FERRITIN: Ferritin: 147 ng/mL (ref 24–336)

## 2020-03-31 MED ORDER — IPRATROPIUM BROMIDE HFA 17 MCG/ACT IN AERS: 2.0000 | INHALATION_SPRAY | Freq: Four times a day (QID) | RESPIRATORY_TRACT | Status: DC | PRN

## 2020-03-31 MED ORDER — SODIUM CHLORIDE 0.9 % IV SOLN: INTRAVENOUS | Status: DC

## 2020-03-31 MED ORDER — POTASSIUM CHLORIDE 10 MEQ/100ML IV SOLN
10.0000 meq | INTRAVENOUS | Status: AC
  Administered 2020-03-31 (×4): 10 meq via INTRAVENOUS
  Filled 2020-03-31 (×4): qty 100

## 2020-03-31 NOTE — Progress Notes (Signed)
Pt has been much more awake, alert and talkative this shift. Requesting water and letting me know what he does & does not like. When asleep his HR drops to mid 40s.

## 2020-03-31 NOTE — Progress Notes (Addendum)
PROGRESS NOTE  HELMUT Mark Mathews:[id number] DOB: 10-26-1940 DOA: 03/27/2020 PCP: Patient, No Pcp Per   LOS: 3 days   Brief narrative:  Mark Mathews is a 80 y.o. male with Parkinson's disease, chronic sacral decubitus ulcer presented to the hospital with altered mental status.  Patient currently lives with his family and is bedridden due to Parkinson's disease.  Family was concerned about worsening bedsore in the sacral region a potential infection since it was more open and draining.  Of note, patient was with hospice [facility] in the past but is not currently on hospice.  In the ED, patient was noted to be febrile with a temperature of 100.2 F and his blood blood pressure was borderline low.  Initial WBC was 4.1.  Urinalysis was abnormal with large leukocytes nitrites and RBC.-Urinalysis with large leukocytes nitrites WBCs and RBCs.  Lactic acid was 1.4.  Chest x-ray done in the ED showed low lung volumes and bibasilar atelectasis.  CT head and cervical spine without acute findings but for apical pneumonia.  CT abdomen and pelvis with decubitus ulceration.  Patient was given 500 mL of IV fluid bolus and IV Rocephin and was admitted to hospital for further evaluation and treatment.   Assessment/Plan:  Active Problems:   Parkinson's disease (HCC)   Sacral wound   Malnutrition of moderate degree   Dementia with behavioral disturbance (HCC)   Acute lower UTI   COVID-19 virus infection   Altered mental status  Altered mental status likely metabolic encephalopathy. Thought to be secondary to UTI.  on IV Unasyn, urine culture showing multiple organisms of staph aureus, Pseudomonas.  Blood culture negative in 3 day.  As per the family, normally he was cognitively fine until this episode until 4-5 days prior to presentation.  Patient appears to be somnolent mildly complicated.  Has not significantly improved compared to yesterday  Dysphagia.  History of Parkinson's disease with current  encephalopathy.  Patient has been seen by speech and swallow evaluation on 03/30/2020.  Recommend dysphagia 1 diet.  Hopefully improve swallow,when his mentation improved. Follow up reevaluation today.  Covid infection Patient is not hypoxic.  On IV remdesivir.  COVID-19 Labs  Recent Labs    03/29/20 0428 03/30/20 0406 03/31/20 0408  DDIMER 0.53* 0.55* 0.45  FERRITIN 166 167 147  CRP 5.2*  5.2* 4.9* 5.8*    Parkinson's disease Continue Sinemet  Immobility resulting in sacral decubitus ulcer Continue wound care.  Failure to thrive/Moderate Protein calorie malnutrition.  Encourage oral nutrition.  Has not been able to take much orally.  Nutrition on board.  Hypokalemia.  Potassium of 2.9 today.  Due to poor oral intake.  Will replenish potassium through IV since he is not taking much orally.  We will give 40 mEq of IV potassium today  Pressure injury, bed bound status.  Mid sacral stage IV.  Present on admission.  Continue wound care.  Pressure Injury 01/16/19 Sacrum Posterior;Mid Stage III -  Full thickness tissue loss. Subcutaneous fat may be visible but bone, tendon or muscle are NOT exposed. (Active)  01/16/19 0800  Location: Sacrum  Location Orientation: Posterior;Mid  Staging: Stage III -  Full thickness tissue loss. Subcutaneous fat may be visible but bone, tendon or muscle are NOT exposed.  Wound Description (Comments):   Present on Admission: Yes     Pressure Injury 03/28/20 Sacrum Mid Stage 4 - Full thickness tissue loss with exposed bone, tendon or muscle. (Active)  03/28/20   Location: Sacrum  Location Orientation: Mid  Staging: Stage 4 - Full thickness tissue loss with exposed bone, tendon or muscle.  Wound Description (Comments):   Present on Admission: Yes   Generalized debility, deconditioning.  Seen by physical therapy.  Likely home on discharge   DVT prophylaxis: heparin injection 5,000 Units Start: 03/28/20 2200   Code Status: DNR  Family  Communication:   I spoke with the patient's son on the phone and updated him about the clinical condition of the patient.  Status is: Inpatient  Remains inpatient appropriate because:IV treatments appropriate due to intensity of illness or inability to take PO and Inpatient level of care appropriate due to severity of illness   Dispo: The patient is from: Home              Anticipated d/c is to: Home with home health, chronic bedbound status.              Anticipated d/c date is: 1 to 2 days              Patient currently is not medically stable to d/c.   Barriers to discharge.  Encephalopathy, poor oral intake, significant hypokalemia   Difficult to place patient No  Consultants:  None  Procedures:  None  Anti-infectives:  . Remdesivir 1/25> . Unasyn iv 1/25>  Anti-infectives (From admission, onward)   Start     Dose/Rate Route Frequency Ordered Stop   03/29/20 1000  remdesivir 100 mg in sodium chloride 0.9 % 100 mL IVPB       "Followed by" Linked Group Details   100 mg 200 mL/hr over 30 Minutes Intravenous Daily 03/28/20 1911 04/02/20 0959   03/28/20 2000  remdesivir 200 mg in sodium chloride 0.9% 250 mL IVPB       "Followed by" Linked Group Details   200 mg 580 mL/hr over 30 Minutes Intravenous Once 03/28/20 1911 03/28/20 2335   03/28/20 0800  Ampicillin-Sulbactam (UNASYN) 3 g in sodium chloride 0.9 % 100 mL IVPB        3 g 200 mL/hr over 30 Minutes Intravenous Every 6 hours 03/28/20 0736     03/27/20 2215  cefTRIAXone (ROCEPHIN) 2 g in sodium chloride 0.9 % 100 mL IVPB        2 g 200 mL/hr over 30 Minutes Intravenous  Once 03/27/20 2201 03/27/20 2251     Subjective: Today, patient was seen and examined at bedside.  No significant changes in his mentation.  Nursing staff reported more alertness yesterday.  Poor oral intake.  Patient denies any pain just answers yes or no to few questions. At baseline, as per family patient is communicative.   Objective: Vitals:    03/30/20 2035 03/31/20 0455  BP: 131/60 122/63  Pulse: 77 67  Resp: 18 19  Temp: 100.3 F (37.9 C) 98.2 F (36.8 C)  SpO2: 92% 95%    Intake/Output Summary (Last 24 hours) at 03/31/2020 1117 Last data filed at 03/31/2020 0500 Gross per 24 hour  Intake 305 ml  Output 2275 ml  Net -1970 ml   Filed Weights   03/27/20 2104  Weight: 61.4 kg   Body mass index is 20.58 kg/m.   Physical Exam:  GENERAL: Patient somnolent, and minimally interactive, answering only few questions yes or no.  Mostly somnolent, not in obvious distress.  Thinly built, chronically ill, flexed posture HENT: No scleral pallor or icterus. Pupils equally reactive to light. Oral mucosa is moist NECK: is supple, no gross swelling noted.  CHEST: .  Diminished breath sounds bilaterally. CVS: S1 and S2 heard, no murmur. Regular rate and rhythm.  ABDOMEN: Soft, non-tender, bowel sounds are present. EXTREMITIES: No edema. CNS: Moving extremities, flat affect, involuntary tremors of the upper extremities SKIN: Stage IV sacral decubitus ulceration present on admission  Data Review: I have personally reviewed the following laboratory data and studies,  CBC: Recent Labs  Lab 03/27/20 2143 03/29/20 0428 03/30/20 0406 03/31/20 0408  WBC 4.1 4.0 4.8 4.7  NEUTROABS 2.1 1.7 2.4 2.6  HGB 11.6* 11.3* 11.3* 11.1*  HCT 35.9* 36.3* 34.8* 34.7*  MCV 93.7 95.8 92.8 92.8  PLT 213 259 277 287   Basic Metabolic Panel: Recent Labs  Lab 03/27/20 2143 03/29/20 0428 03/30/20 0406 03/31/20 0408  NA 141 144 141 145  K 3.8 3.5 3.0* 2.9*  CL 109 111 110 112*  CO2 22 22 21* 25  GLUCOSE 126* 91 110* 135*  BUN 29* 20 28* 24*  CREATININE 0.70 0.62 0.59* 0.43*  CALCIUM 8.2* 8.1* 8.0* 8.3*   Liver Function Tests: Recent Labs  Lab 03/27/20 2143 03/29/20 0428 03/30/20 0406 03/31/20 0408  AST 97* 91* 62* 43*  ALT 16 40 11 13  ALKPHOS 41 41 40 36*  BILITOT 0.3 0.4 0.5 0.4  PROT 6.8 6.6 6.7 6.4*  ALBUMIN 2.9* 2.9* 2.9*  2.7*   No results for input(s): LIPASE, AMYLASE in the last 168 hours. No results for input(s): AMMONIA in the last 168 hours. Cardiac Enzymes: No results for input(s): CKTOTAL, CKMB, CKMBINDEX, TROPONINI in the last 168 hours. BNP (last 3 results) No results for input(s): BNP in the last 8760 hours.  ProBNP (last 3 results) No results for input(s): PROBNP in the last 8760 hours.  CBG: Recent Labs  Lab 03/30/20 1443  GLUCAP 130*   Recent Results (from the past 240 hour(s))  Blood Culture (routine x 2)     Status: None (Preliminary result)   Collection Time: 03/27/20  9:44 PM   Specimen: BLOOD  Result Value Ref Range Status   Specimen Description   Final    BLOOD BLOOD LEFT WRIST Performed at [facility], [address] [place], [place] [address]    Special Requests   Final    BOTTLES DRAWN AEROBIC AND ANAEROBIC Blood Culture adequate volume Performed at [facility], [address], [place], [place] [address]    Culture   Final    NO GROWTH 3 DAYS Performed at [facility], [address], [place], [place] [address]    Report Status PENDING  Incomplete  Blood Culture (routine x 2)     Status: None (Preliminary result)   Collection Time: 03/27/20  9:49 PM   Specimen: BLOOD  Result Value Ref Range Status   Specimen Description   Final    BLOOD BLOOD RIGHT FOREARM Performed at [facility], [address], [place], [place] [address]    Special Requests   Final    BOTTLES DRAWN AEROBIC AND ANAEROBIC Blood Culture adequate volume Performed at [facility], [address], [place], [place] [address]    Culture   Final    NO GROWTH 3 DAYS Performed at [facility], [address], [place], [place] [address]    Report Status PENDING  Incomplete  Urine culture     Status: Abnormal (Preliminary result)   Collection Time: 03/27/20 11:13 PM   Specimen: In/Out Cath Urine  Result Value  Ref Range Status   Specimen Description   Final    IN/OUT CATH URINE Performed at [facility], [address] [place], [place] [address]    Special Requests   Final    NONE Performed at [facility], [address], [place], [place] [address]    Culture (A)  Final    >=100,000 COLONIES/mL STAPHYLOCOCCUS AUREUS >=100,000 COLONIES/mL PSEUDOMONAS PUTIDA REPEATING SUSCEPTIBILITY FOR STAPH AUREUS Performed at [facility], [address] [place], [place] [address]    Report Status PENDING  Incomplete   Organism ID, Bacteria PSEUDOMONAS PUTIDA (A)  Final      Susceptibility   Pseudomonas putida - MIC*    CEFTAZIDIME 8 SENSITIVE Sensitive     CIPROFLOXACIN >=4 RESISTANT Resistant     GENTAMICIN >=16 RESISTANT Resistant     IMIPENEM >=16 RESISTANT Resistant     PIP/TAZO 16 SENSITIVE Sensitive     * >=100,000 COLONIES/mL PSEUDOMONAS PUTIDA     Studies: No results found.    Joycelyn Das, MD  [facility] 03/31/2020  If 7PM-7AM, please contact night-coverage

## 2020-03-31 NOTE — Care Management Important Message (Signed)
Important Message  Patient Details IM Letter placed in Patient's door Caddy. Name: Mark Mathews MRN: [id number] Date of Birth: 1940-04-14   Medicare Important Message Given:  Yes     Caren Macadam 03/31/2020, 1:02 PM

## 2020-03-31 NOTE — TOC Initial Note (Signed)
Transition of Care [facility]) - Initial/Assessment Note    Patient Details  Name: Mark Mathews MRN: [id number] Date of Birth: 04-Jun-1940  [facility] CM/SW Contact:    Ida Rogue, LCSW Phone Number: 03/31/2020, 1:28 PM  Clinical Narrative:   Spoke with son to find out about any known needs.  He states he and his family live with the patient and care for him 24/7.  They have all needed equipment, including hoyer lift, hospital bed, wheel chair and supply of diapers. They change his wound dressings and arrange transport to get Foley changed out monthly. However, son indicates that he has been working with PCP for some time now trying to get [facility] RN in place with no success.  He asked for help with that.  No further needs identified.  Cindie with [facility] confirmed ability to provide this service. Son informed. Will need order for [facility] RN at d/c. TOC will continue to follow during the course of hospitalization.                Expected Discharge Plan: Home w Home Health Services Barriers to Discharge: No Barriers Identified   Patient Goals and CMS Choice     Choice offered to / list presented to : Adult Children  Expected Discharge Plan and Services Expected Discharge Plan: Home w Home Health Services   Discharge Planning Services: CM Consult Post Acute Care Choice: Home Health Living arrangements for the past 2 months: Single Family Home                             HH Agency: [facility] Date [facility] Agency Contacted: 03/31/20 Time HH Agency Contacted: 1325 Representative spoke with at [facility] Agency: Cindie  Prior Living Arrangements/Services Living arrangements for the past 2 months: Single Family Home Lives with:: Adult Children Patient language and need for interpreter reviewed:: Yes Do you feel safe going back to the place where you live?: Yes      Need for Family Participation in Patient Care: Yes (Comment) Care giver support system in place?: Yes  (comment) Current home services: DME Criminal Activity/Legal Involvement Pertinent to Current Situation/Hospitalization: No - Comment as needed  Activities of Daily Living Home Assistive Devices/Equipment: Eyeglasses,Hoyer Lift,Hospital bed,Other (Comment) (air mattress overlay, transport chair, suprapubic catheter) ADL Screening (condition at time of admission) Patient's cognitive ability adequate to safely complete daily activities?: No Is the patient deaf or have difficulty hearing?: No Does the patient have difficulty seeing, even when wearing glasses/contacts?: No Does the patient have difficulty concentrating, remembering, or making decisions?: Yes Patient able to express need for assistance with ADLs?: No Does the patient have difficulty dressing or bathing?: Yes Independently performs ADLs?: No Communication: Independent Dressing (OT): Dependent Is this a change from baseline?: Pre-admission baseline Grooming: Dependent Is this a change from baseline?: Pre-admission baseline Feeding: Dependent Is this a change from baseline?: Pre-admission baseline Bathing: Dependent Is this a change from baseline?: Pre-admission baseline Toileting: Dependent Is this a change from baseline?: Pre-admission baseline In/Out Bed: Dependent Is this a change from baseline?: Pre-admission baseline Walks in Home: Dependent (patient bedbound) Is this a change from baseline?: Pre-admission baseline Does the patient have difficulty walking or climbing stairs?: Yes (secondary to Parkinson's) Weakness of Legs: Both Weakness of Arms/Hands: Both  Permission Sought/Granted Permission sought to share information with : Family Supports Permission granted to share information with : No  Share Information with NAME:  Friend,Craig (Son)   [phone]           Emotional Assessment       Orientation: : Oriented to Self Alcohol / Substance Use: Not Applicable Psych Involvement: No  (comment)  Admission diagnosis:  Altered mental status [R41.82] Acute UTI [N39.0] COVID-19 virus infection [U07.1] Patient Active Problem List   Diagnosis Date Noted  . Acute lower UTI 03/28/2020  . COVID-19 virus infection 03/28/2020  . Altered mental status 03/28/2020  . Bacteremia 01/16/2019  . Palliative care by specialist   . Wound infection   . Dementia with behavioral disturbance (HCC)   . Malnutrition of moderate degree 04/06/2018  . Pressure injury of skin 04/06/2018  . Severe sepsis (HCC) 04/02/2018  . Sacral wound 04/02/2018  . Abdominal tenderness 04/02/2018  . Chronic anemia 04/02/2018  . Goals of care, counseling/discussion   . Palliative care encounter   . Closed compression fracture of L3 lumbar vertebra 04/09/2016  . Parkinson's disease (HCC) 10/12/2012   PCP:  Patient, No Pcp Per Pharmacy:   [id number] [place], [place] - [address] AT [facility] [address] [facility] [address] [place] [phone] Phone: [phone] Fax: [phone]     Social Determinants of Health (SDOH) Interventions    Readmission Risk Interventions No flowsheet data found.

## 2020-03-31 NOTE — Progress Notes (Signed)
  Speech Language Pathology Treatment: Dysphagia  Patient Details Name: Mark Mathews MRN: [id number] DOB: 1940/11/28 Today's Date: 03/31/2020 Time: 8338-2505 SLP Time Calculation (min) (ACUTE ONLY): 39 min  Assessment / Plan / Recommendation Clinical Impression  Delayed cough x1 with sequential boluses of thin water - this occured with one of approx 10 boluses.  Continue to advise pt consume thin water between meals.  Pt states he is not hungry and advised he would not eat more even if diet was advanced as he has "no appetite".  He did state he wanted a "boiled egg" and thus finally was agreeable to try solids with SLP to assure advancement appropriate.     Pt with adequate albeit slow mastication without residuals.  He does require max cues to accept po intake and nutritional adequacy remains a concern.  Mentation is better and thus swallowing has improved = thus recommend to advance diet to dys3/nectar and allow thin water between meals.    Oral care/oral suction before and after po indicated.  Pt needed max cues to allow SLP to provide oral care - removing thin whitish secretions BEFORE PO provided - due to decreased management of secretions secondary to Parkinson's.  After oral care and with water consumption,  minimal anterior labial loss of water on left *leaning left* without awareness. SLP suspects pt drools at home again without awareness. Informed pt of importance of oral care for pulmonary hygiene.  Pt will remain aspiration risk but is appropriate for advancement in diet with strict precautions.  Updated swallow precaution signs in room and orders. Will follow to assure tolerance.   Pt would benefit from consideration for RMST if family could provide carryover to strengthen swallow musculature to decrease aspiration risk/retention.     HPI HPI: Mark Mathews is a 80 y.o. male with Parkinson's, chronic sacral decubitus ulcer presented to ED for altered mentation.  Pt has been  followed for swallowing and he has shown slow progress re: his swallowing thus diet has been slowly modified to allow advanced textures/liquids.      SLP Plan  Continue with current plan of care       Recommendations  Diet recommendations: Dysphagia 3 (mechanical soft);Nectar-thick liquid (thin water between meals) Medication Administration: Whole meds with puree Compensations: Slow rate;Small sips/bites;Monitor for anterior loss;Other (Comment) (suction before and after po intake!) Postural Changes and/or Swallow Maneuvers: Seated upright 90 degrees;Upright 30-60 min after meal                Oral Care Recommendations: Oral care before and after PO (BEFORE AND AFTER) Follow up Recommendations: Other (comment) (TBD) SLP Visit Diagnosis: Dysphagia, oropharyngeal phase (R13.12) Plan: Continue with current plan of care       GO                Mark Mathews 03/31/2020, 2:56 PM   Mark Mathews, Mark Mathews SLP Acute Rehab Services Office [phone] Pager [phone]

## 2020-03-31 NOTE — Progress Notes (Signed)
Pt is bed bound

## 2020-04-01 LAB — COMPREHENSIVE METABOLIC PANEL
ALT: 13 U/L (ref 0–44)
AST: 42 U/L — ABNORMAL HIGH (ref 15–41)
Albumin: 2.5 g/dL — ABNORMAL LOW (ref 3.5–5.0)
Alkaline Phosphatase: 37 U/L — ABNORMAL LOW (ref 38–126)
Anion gap: 8 (ref 5–15)
BUN: 19 mg/dL (ref 8–23)
CO2: 23 mmol/L (ref 22–32)
Calcium: 8 mg/dL — ABNORMAL LOW (ref 8.9–10.3)
Chloride: 111 mmol/L (ref 98–111)
Creatinine, Ser: 0.53 mg/dL — ABNORMAL LOW (ref 0.61–1.24)
GFR, Estimated: >60 mL/min (ref >60)
Glucose, Bld: 88 mg/dL (ref 70–99)
Potassium: 2.9 mmol/L — ABNORMAL LOW (ref 3.5–5.1)
Sodium: 142 mmol/L (ref 135–145)
Total Bilirubin: 0.6 mg/dL (ref 0.3–1.2)
Total Protein: 6.2 g/dL — ABNORMAL LOW (ref 6.5–8.1)

## 2020-04-01 LAB — CBC WITH DIFFERENTIAL/PLATELET
Abs Immature Granulocytes: 0.02 10*3/uL (ref 0.00–0.07)
Basophils Absolute: 0 10*3/uL (ref 0.0–0.1)
Basophils Relative: 0 %
Eosinophils Absolute: 0.1 10*3/uL (ref 0.0–0.5)
Eosinophils Relative: 1 %
HCT: 32.5 % — ABNORMAL LOW (ref 39.0–52.0)
Hemoglobin: 10.5 g/dL — ABNORMAL LOW (ref 13.0–17.0)
Immature Granulocytes: 0 %
Lymphocytes Relative: 33 %
Lymphs Abs: 1.6 10*3/uL (ref 0.7–4.0)
MCH: 29.8 pg (ref 26.0–34.0)
MCHC: 32.3 g/dL (ref 30.0–36.0)
MCV: 92.3 fL (ref 80.0–100.0)
Monocytes Absolute: 0.8 10*3/uL (ref 0.1–1.0)
Monocytes Relative: 16 %
Neutro Abs: 2.4 10*3/uL (ref 1.7–7.7)
Neutrophils Relative %: 50 %
Platelets: 296 10*3/uL (ref 150–400)
RBC: 3.52 MIL/uL — ABNORMAL LOW (ref 4.22–5.81)
RDW: 13.4 % (ref 11.5–15.5)
WBC: 5 10*3/uL (ref 4.0–10.5)
nRBC: 0 % (ref 0.0–0.2)

## 2020-04-01 LAB — URINE CULTURE: Culture: >=100000 — AB

## 2020-04-01 MED ORDER — POTASSIUM CHLORIDE 10 MEQ/100ML IV SOLN
INTRAVENOUS | Status: AC
  Filled 2020-04-01: qty 100

## 2020-04-01 MED ORDER — POTASSIUM CHLORIDE CRYS ER 20 MEQ PO TBCR
40.0000 meq | EXTENDED_RELEASE_TABLET | Freq: Once | ORAL | Status: AC
  Administered 2020-04-01: 40 meq via ORAL
  Filled 2020-04-01: qty 2

## 2020-04-01 MED ORDER — POTASSIUM CHLORIDE 10 MEQ/100ML IV SOLN
10.0000 meq | INTRAVENOUS | Status: AC
  Administered 2020-04-01 (×6): 10 meq via INTRAVENOUS
  Filled 2020-04-01 (×5): qty 100

## 2020-04-01 NOTE — Progress Notes (Addendum)
PROGRESS NOTE  Mark Mathews:[id number] DOB: Oct 01, 1940 DOA: 03/27/2020 PCP: Patient, No Pcp Per   LOS: 4 days   Brief narrative:  Mark Mathews is a 80 y.o. male with Parkinson's disease, chronic sacral decubitus ulcer presented to the hospital with altered mental status.  Patient currently lives with his family and is bedridden due to Parkinson's disease.  Patient's family was concerned about worsening bedsore in the sacral region a potential infection since it was more open and draining.  Of note, patient was with hospice [facility] in the past but is not currently on hospice.  In the ED, patient was noted to be febrile with a temperature of 100.2 F and his blood blood pressure was borderline low.  Initial WBC was 4.1.  Urinalysis was abnormal with large leukocytes nitrites and RBC.-Urinalysis with large leukocytes nitrites WBCs and RBCs.  Lactic acid was 1.4.  Chest x-ray done in the ED showed low lung volumes and bibasilar atelectasis.  CT head and cervical spine without acute findings but for apical pneumonia.  CT abdomen and pelvis with decubitus ulceration.  Patient was given 500 mL of IV fluid bolus and IV Rocephin and was admitted to hospital for further evaluation and treatment.   Assessment/Plan:  Active Problems:   Parkinson's disease (HCC)   Sacral wound   Malnutrition of moderate degree   Dementia with behavioral disturbance (HCC)   Acute lower UTI   COVID-19 virus infection   Altered mental status  Altered mental status likely metabolic encephalopathy. Thought to be secondary to UTI.  Currently on IV Unasyn, urine culture showing multiple organisms of staph aureus, Pseudomonas.  Blood culture negative so far.  As per the family, normally he was cognitively fine until this episode until 4-5 days prior to presentation.  More alert awake and communicative today.  Dysphagia.  History of Parkinson's disease with current encephalopathy.  Patient has been seen by speech and  swallow evaluation on 03/30/2020.  Has been advanced to a dysphagia II diet with at this time.  Covid infection Patient is not hypoxic.  On IV remdesivir.  COVID-19 Labs  Recent Labs    03/30/20 0406 03/31/20 0408  DDIMER 0.55* 0.45  FERRITIN 167 147  CRP 4.9* 5.8*    Parkinson's disease Continue Sinemet  Immobility resulting in sacral decubitus ulcer Continue wound care.  Failure to thrive/Moderate Protein calorie malnutrition.  Encourage oral nutrition.   Hypokalemia.  Potassium still at 2.9 today.  Likely due to poor oral intake. replenish potassium through IV since he is not taking much orally.  We will give 60 mEq of IV potassium today.  Add p.o. potassium if able to take p.o.  Pressure injury, bed bound status.  Mid sacral stage IV.  Present on admission.  Continue wound care.  Pressure Injury 01/16/19 Sacrum Posterior;Mid Stage III -  Full thickness tissue loss. Subcutaneous fat may be visible but bone, tendon or muscle are NOT exposed. (Active)  01/16/19 0800  Location: Sacrum  Location Orientation: Posterior;Mid  Staging: Stage III -  Full thickness tissue loss. Subcutaneous fat may be visible but bone, tendon or muscle are NOT exposed.  Wound Description (Comments):   Present on Admission: Yes     Pressure Injury 03/28/20 Sacrum Mid Stage 4 - Full thickness tissue loss with exposed bone, tendon or muscle. (Active)  03/28/20   Location: Sacrum  Location Orientation: Mid  Staging: Stage 4 - Full thickness tissue loss with exposed bone, tendon or muscle.  Wound Description (  Comments):   Present on Admission: Yes   Generalized debility, deconditioning.  Seen by physical therapy.  Likely home on discharge   DVT prophylaxis: heparin injection 5,000 Units Start: 03/28/20 2200   Code Status: DNR  Family Communication:    None today. I spoke with the patient's son on the phone and updated him about the clinical condition of the patient on 1/28  Status is:  Inpatient  Remains inpatient appropriate because:IV treatments appropriate due to intensity of illness or inability to take PO and Inpatient level of care appropriate due to severity of illness, severe hypokalemia, encephalopathy   Dispo: The patient is from: Home              Anticipated d/c is to: Home with home health, chronic bedbound status.              Anticipated d/c date is: 1 to 2 days              Patient currently is not medically stable to d/c.   Barriers to discharge.  Improving encephalopathy, poor oral intake, significant hypokalemia, completing remdesivir tomorrow   Difficult to place patient No  Consultants:  None  Procedures:  None  Anti-infectives:   Remdesivir 1/25>  Unasyn iv 1/25>  Anti-infectives (From admission, onward)   Start     Dose/Rate Route Frequency Ordered Stop   03/29/20 1000  remdesivir 100 mg in sodium chloride 0.9 % 100 mL IVPB       "Followed by" Linked Group Details   100 mg 200 mL/hr over 30 Minutes Intravenous Daily 03/28/20 1911 04/02/20 0959   03/28/20 2000  remdesivir 200 mg in sodium chloride 0.9% 250 mL IVPB       "Followed by" Linked Group Details   200 mg 580 mL/hr over 30 Minutes Intravenous Once 03/28/20 1911 03/28/20 2335   03/28/20 0800  Ampicillin-Sulbactam (UNASYN) 3 g in sodium chloride 0.9 % 100 mL IVPB        3 g 200 mL/hr over 30 Minutes Intravenous Every 6 hours 03/28/20 0736     03/27/20 2215  cefTRIAXone (ROCEPHIN) 2 g in sodium chloride 0.9 % 100 mL IVPB        2 g 200 mL/hr over 30 Minutes Intravenous  Once 03/27/20 2201 03/27/20 2251     Subjective: Today, patient was seen and examined at bedside.  Patient is more alert awake and communicative today.  Answering few questions appropriately.  Objective: Vitals:   03/31/20 1951 04/01/20 0350  BP: 131/71 125/66  Pulse: 69 66  Resp: 18 18  Temp: 99 F (37.2 C) 99.2 F (37.3 C)  SpO2: 95% 93%    Intake/Output Summary (Last 24 hours) at 04/01/2020  0805 Last data filed at 04/01/2020 0600 Gross per 24 hour  Intake 1539.01 ml  Output 900 ml  Net 639.01 ml   Filed Weights   03/27/20 2104  Weight: 61.4 kg   Body mass index is 20.58 kg/m.   Physical Exam:  General: Patient is more alert awake and communicative.  Answering appropriately few questions.  Thinly built, chronically ill HENT:   No scleral pallor or icterus noted. Oral mucosa is moist.  Chest:    Diminished breath sounds bilaterally. CVS: S1 &S2 heard. No murmur.  Regular rate and rhythm. Abdomen: Soft, nontender, nondistended.  Bowel sounds are heard.   Extremities: No cyanosis, clubbing or edema.  Peripheral pulses are palpable. Psych: Alert, awake and communicative, flat affect CNS: Moving extremities, flat  affect, involuntary tremors of the upper extremities. Skin: Stage IV sacral decubitus ulceration, present on admission   Data Review: I have personally reviewed the following laboratory data and studies,  CBC: Recent Labs  Lab 03/27/20 2143 03/29/20 0428 03/30/20 0406 03/31/20 0408 04/01/20 0411  WBC 4.1 4.0 4.8 4.7 5.0  NEUTROABS 2.1 1.7 2.4 2.6 2.4  HGB 11.6* 11.3* 11.3* 11.1* 10.5*  HCT 35.9* 36.3* 34.8* 34.7* 32.5*  MCV 93.7 95.8 92.8 92.8 92.3  PLT 213 259 277 287 296   Basic Metabolic Panel: Recent Labs  Lab 03/27/20 2143 03/29/20 0428 03/30/20 0406 03/31/20 0408 04/01/20 0411  NA 141 144 141 145 142  K 3.8 3.5 3.0* 2.9* 2.9*  CL 109 111 110 112* 111  CO2 22 22 21* 25 23  GLUCOSE 126* 91 110* 135* 88  BUN 29* 20 28* 24* 19  CREATININE 0.70 0.62 0.59* 0.43* 0.53*  CALCIUM 8.2* 8.1* 8.0* 8.3* 8.0*   Liver Function Tests: Recent Labs  Lab 03/27/20 2143 03/29/20 0428 03/30/20 0406 03/31/20 0408 04/01/20 0411  AST 97* 91* 62* 43* 42*  ALT 16 40 11 13 13   ALKPHOS 41 41 40 36* 37*  BILITOT 0.3 0.4 0.5 0.4 0.6  PROT 6.8 6.6 6.7 6.4* 6.2*  ALBUMIN 2.9* 2.9* 2.9* 2.7* 2.5*   No results for input(s): LIPASE, AMYLASE in the last  168 hours. No results for input(s): AMMONIA in the last 168 hours. Cardiac Enzymes: No results for input(s): CKTOTAL, CKMB, CKMBINDEX, TROPONINI in the last 168 hours. BNP (last 3 results) No results for input(s): BNP in the last 8760 hours.  ProBNP (last 3 results) No results for input(s): PROBNP in the last 8760 hours.  CBG: Recent Labs  Lab 03/30/20 1443  GLUCAP 130*   Recent Results (from the past 240 hour(s))  Blood Culture (routine x 2)     Status: None (Preliminary result)   Collection Time: 03/27/20  9:44 PM   Specimen: BLOOD  Result Value Ref Range Status   Specimen Description   Final    BLOOD BLOOD LEFT WRIST Performed at [facility], [address], [place], [place]    Special Requests   Final    BOTTLES DRAWN AEROBIC AND ANAEROBIC Blood Culture adequate volume Performed at [facility], [address], [place], [place]    Culture   Final    NO GROWTH 3 DAYS Performed at [facility], [address] [place], [place]    Report Status PENDING  Incomplete  Blood Culture (routine x 2)     Status: None (Preliminary result)   Collection Time: 03/27/20  9:49 PM   Specimen: BLOOD  Result Value Ref Range Status   Specimen Description   Final    BLOOD BLOOD RIGHT FOREARM Performed at [facility], [address], [place], [place]    Special Requests   Final    BOTTLES DRAWN AEROBIC AND ANAEROBIC Blood Culture adequate volume Performed at [facility], [address] [place], [place]    Culture   Final    NO GROWTH 3 DAYS Performed at [facility], [address], [place], [place]    Report Status PENDING  Incomplete  Urine culture     Status: Abnormal (Preliminary result)   Collection Time: 03/27/20 11:13 PM   Specimen: In/Out Cath Urine  Result Value Ref Range Status   Specimen Description  Final    IN/OUT  CATH URINE Performed at [facility], [address] [place] [address] [place], [place] [address]    Special Requests   Final    NONE Performed at [facility], [address] [place], [place] [address]    Culture (A)  Final    >=100,000 COLONIES/mL STAPHYLOCOCCUS AUREUS >=100,000 COLONIES/mL PSEUDOMONAS PUTIDA REPEATING SUSCEPTIBILITY FOR STAPH AUREUS Performed at [facility], [address] [place], [place] [address]    Report Status PENDING  Incomplete   Organism ID, Bacteria PSEUDOMONAS PUTIDA (A)  Final      Susceptibility   Pseudomonas putida - MIC*    CEFTAZIDIME 8 SENSITIVE Sensitive     CIPROFLOXACIN >=4 RESISTANT Resistant     GENTAMICIN >=16 RESISTANT Resistant     IMIPENEM >=16 RESISTANT Resistant     PIP/TAZO 16 SENSITIVE Sensitive     * >=100,000 COLONIES/mL PSEUDOMONAS PUTIDA     Studies: No results found.    Joycelyn Das, MD  [facility] 04/01/2020  If 7PM-7AM, please contact night-coverage

## 2020-04-02 LAB — COMPREHENSIVE METABOLIC PANEL
ALT: 13 U/L (ref 0–44)
AST: 39 U/L (ref 15–41)
Albumin: 2.5 g/dL — ABNORMAL LOW (ref 3.5–5.0)
Alkaline Phosphatase: 40 U/L (ref 38–126)
Anion gap: 8 (ref 5–15)
BUN: 13 mg/dL (ref 8–23)
CO2: 22 mmol/L (ref 22–32)
Calcium: 7.9 mg/dL — ABNORMAL LOW (ref 8.9–10.3)
Chloride: 107 mmol/L (ref 98–111)
Creatinine, Ser: 0.51 mg/dL — ABNORMAL LOW (ref 0.61–1.24)
GFR, Estimated: >60 mL/min (ref >60)
Glucose, Bld: 83 mg/dL (ref 70–99)
Potassium: 3.2 mmol/L — ABNORMAL LOW (ref 3.5–5.1)
Sodium: 137 mmol/L (ref 135–145)
Total Bilirubin: 0.6 mg/dL (ref 0.3–1.2)
Total Protein: 6.2 g/dL — ABNORMAL LOW (ref 6.5–8.1)

## 2020-04-02 LAB — CULTURE, BLOOD (ROUTINE X 2)
Culture: NO GROWTH
Culture: NO GROWTH
Special Requests: ADEQUATE
Special Requests: ADEQUATE

## 2020-04-02 LAB — CBC WITH DIFFERENTIAL/PLATELET
Abs Immature Granulocytes: 0.07 10*3/uL (ref 0.00–0.07)
Basophils Absolute: 0 10*3/uL (ref 0.0–0.1)
Basophils Relative: 0 %
Eosinophils Absolute: 0.1 10*3/uL (ref 0.0–0.5)
Eosinophils Relative: 1 %
HCT: 33.5 % — ABNORMAL LOW (ref 39.0–52.0)
Hemoglobin: 10.8 g/dL — ABNORMAL LOW (ref 13.0–17.0)
Immature Granulocytes: 1 %
Lymphocytes Relative: 28 %
Lymphs Abs: 1.5 10*3/uL (ref 0.7–4.0)
MCH: 29.6 pg (ref 26.0–34.0)
MCHC: 32.2 g/dL (ref 30.0–36.0)
MCV: 91.8 fL (ref 80.0–100.0)
Monocytes Absolute: 0.9 10*3/uL (ref 0.1–1.0)
Monocytes Relative: 17 %
Neutro Abs: 2.9 10*3/uL (ref 1.7–7.7)
Neutrophils Relative %: 53 %
Platelets: 312 10*3/uL (ref 150–400)
RBC: 3.65 MIL/uL — ABNORMAL LOW (ref 4.22–5.81)
RDW: 13.3 % (ref 11.5–15.5)
WBC: 5.4 10*3/uL (ref 4.0–10.5)
nRBC: 0 % (ref 0.0–0.2)

## 2020-04-02 LAB — MAGNESIUM: Magnesium: 1.8 mg/dL (ref 1.7–2.4)

## 2020-04-02 LAB — PHOSPHORUS: Phosphorus: 2 mg/dL — ABNORMAL LOW (ref 2.5–4.6)

## 2020-04-02 MED ORDER — POTASSIUM CHLORIDE CRYS ER 20 MEQ PO TBCR
40.0000 meq | EXTENDED_RELEASE_TABLET | Freq: Once | ORAL | Status: AC
  Administered 2020-04-02: 40 meq via ORAL
  Filled 2020-04-02: qty 2

## 2020-04-02 MED ORDER — POTASSIUM PHOSPHATES 15 MMOLE/5ML IV SOLN
30.0000 mmol | Freq: Once | INTRAVENOUS | Status: AC
  Administered 2020-04-02: 30 mmol via INTRAVENOUS
  Filled 2020-04-02: qty 10

## 2020-04-02 NOTE — Progress Notes (Signed)
PROGRESS NOTE  Mark Mathews XBL:[id number] DOB: 08-25-1940 DOA: 03/27/2020 PCP: Patient, No Pcp Per   LOS: 5 days   Brief narrative:  Mark Mathews is a 80 y.o. male with Parkinson's disease, chronic sacral decubitus ulcer presented to the hospital with altered mental status.  Patient currently lives with his family and is bedridden due to Parkinson's disease.  Patient's family was concerned about worsening bedsore in the sacral region a potential infection since it was more open and draining.  Of note, patient was with hospice [facility] in the past but is not currently on hospice.  In the ED, patient was noted to be febrile with a temperature of 100.2 F and his blood blood pressure was borderline low.  Initial WBC was 4.1.  Urinalysis was abnormal with large leukocytes nitrites and RBC.-Urinalysis with large leukocytes nitrites WBCs and RBCs.  Lactic acid was 1.4.  Chest x-ray done in the ED showed low lung volumes and bibasilar atelectasis.  CT head and cervical spine without acute findings but for apical pneumonia.  CT abdomen and pelvis with decubitus ulceration.  Patient was given 500 mL of IV fluid bolus and IV Rocephin and was admitted to hospital for further evaluation and treatment.   Assessment/Plan:  Active Problems:   Parkinson's disease (HCC)   Sacral wound   Malnutrition of moderate degree   Dementia with behavioral disturbance (HCC)   Acute lower UTI   COVID-19 virus infection   Altered mental status  Altered mental status likely metabolic encephalopathy. Improved.  More interactive alert awake and communicative today thought to be secondary to UTI.  Currently on IV Unasyn, urine culture showing multiple organisms of staph aureus, Pseudomonas.  Blood culture negative so far.  As per the family, normally he was cognitively fine until this episode until 4-5 days prior to presentation.  Patient is more alert awake and communicative.  Answering few questions  appropriately.  Dysphagia.  History of Parkinson's disease with current encephalopathy.  Patient has been seen by speech and swallow evaluation on 03/30/2020.  Has been advanced to a dysphagia II diet with at this time.  Covid infection Patient is not hypoxic. completed IV remdesivir.  COVID-19 Labs  Recent Labs    03/31/20 0408  DDIMER 0.45  FERRITIN 147  CRP 5.8*    Parkinson's disease Continue Sinemet  Immobility resulting in sacral decubitus ulcer Continue wound care.  Failure to thrive/Moderate Protein calorie malnutrition.  Encourage oral nutrition.   Hypokalemia.  Received IV 60 mEq of potassium plus p.o. potassium yesterday, potassium level of 3.2 today.  Phosphorus low at 2.0.  Will replenish with K-Phos 30 mmol and p.o. potassium 40 meq today.  Check levels in a.m.  Pressure injury, bed bound status.  Mid sacral stage IV.  Present on admission.  Continue wound care.  Pressure Injury 01/16/19 Sacrum Posterior;Mid Stage III -  Full thickness tissue loss. Subcutaneous fat may be visible but bone, tendon or muscle are NOT exposed. (Active)  01/16/19 0800  Location: Sacrum  Location Orientation: Posterior;Mid  Staging: Stage III -  Full thickness tissue loss. Subcutaneous fat may be visible but bone, tendon or muscle are NOT exposed.  Wound Description (Comments):   Present on Admission: Yes     Pressure Injury 03/28/20 Sacrum Mid Stage 4 - Full thickness tissue loss with exposed bone, tendon or muscle. (Active)  03/28/20   Location: Sacrum  Location Orientation: Mid  Staging: Stage 4 - Full thickness tissue loss with exposed bone, tendon  or muscle.  Wound Description (Comments):   Present on Admission: Yes   Generalized debility, deconditioning.  Seen by physical therapy.  Likely home on discharge  DVT prophylaxis: heparin injection 5,000 Units Start: 03/28/20 2200   Code Status: DNR  Family Communication:     I left a message for the patient's son on the  phone today.  Status is: Inpatient  Remains inpatient appropriate because:IV treatments appropriate due to intensity of illness or inability to take PO and Inpatient level of care appropriate due to severity of illness,  hypokalemia, encephalopathy   Dispo: The patient is from: Home              Anticipated d/c is to: Home with home health, chronic bedbound status.              Anticipated d/c date is: Likely tomorrow              Patient currently is not medically stable to d/c.   Barriers to discharge.  Electrolyte issues, improving encephalopathy,   Difficult to place patient No  Consultants:  None  Procedures:  None  Anti-infectives:  . Remdesivir 1/25> . Unasyn iv 1/25>  Anti-infectives (From admission, onward)   Start     Dose/Rate Route Frequency Ordered Stop   03/29/20 1000  remdesivir 100 mg in sodium chloride 0.9 % 100 mL IVPB       "Followed by" Linked Group Details   100 mg 200 mL/hr over 30 Minutes Intravenous Daily 03/28/20 1911 04/01/20 1423   03/28/20 2000  remdesivir 200 mg in sodium chloride 0.9% 250 mL IVPB       "Followed by" Linked Group Details   200 mg 580 mL/hr over 30 Minutes Intravenous Once 03/28/20 1911 03/28/20 2335   03/28/20 0800  Ampicillin-Sulbactam (UNASYN) 3 g in sodium chloride 0.9 % 100 mL IVPB        3 g 200 mL/hr over 30 Minutes Intravenous Every 6 hours 03/28/20 0736     03/27/20 2215  cefTRIAXone (ROCEPHIN) 2 g in sodium chloride 0.9 % 100 mL IVPB        2 g 200 mL/hr over 30 Minutes Intravenous  Once 03/27/20 2201 03/27/20 2251     Subjective: Today, patient was seen and examined at bedside.  Staff reported that the patient was more alert awake communicative and answering questions appropriately.   Objective: Vitals:   04/01/20 2027 04/02/20 0615  BP: 136/69 119/64  Pulse: 67 74  Resp: 18 19  Temp: 99.6 F (37.6 C) 97.7 F (36.5 C)  SpO2: 94% 96%    Intake/Output Summary (Last 24 hours) at 04/02/2020 0848 Last data  filed at 04/01/2020 2300 Gross per 24 hour  Intake 780 ml  Output 350 ml  Net 430 ml   Filed Weights   03/27/20 2104  Weight: 61.4 kg   Body mass index is 20.58 kg/m.   Physical Exam:  General: Patient is more alert awake and communicative.  Answering appropriately few questions.  Thinly built, chronically ill HENT:   No scleral pallor or icterus noted. Oral mucosa is moist.  Chest:    Diminished breath sounds bilaterally. CVS: S1 &S2 heard. No murmur.  Regular rate and rhythm. Abdomen: Soft, nontender, nondistended.  Bowel sounds are heard.   Extremities: No cyanosis, clubbing or edema.  Peripheral pulses are palpable. Psych: Alert, awake and communicative,  CNS: Moving extremities, flat affect, involuntary tremors of the upper extremities. Skin: Stage IV sacral decubitus ulceration,  present on admission   Data Review: I have personally reviewed the following laboratory data and studies,  CBC: Recent Labs  Lab 03/29/20 0428 03/30/20 0406 03/31/20 0408 04/01/20 0411 04/02/20 0356  WBC 4.0 4.8 4.7 5.0 5.4  NEUTROABS 1.7 2.4 2.6 2.4 2.9  HGB 11.3* 11.3* 11.1* 10.5* 10.8*  HCT 36.3* 34.8* 34.7* 32.5* 33.5*  MCV 95.8 92.8 92.8 92.3 91.8  PLT 259 277 287 296 312   Basic Metabolic Panel: Recent Labs  Lab 03/29/20 0428 03/30/20 0406 03/31/20 0408 04/01/20 0411 04/02/20 0356  NA 144 141 145 142 137  K 3.5 3.0* 2.9* 2.9* 3.2*  CL 111 110 112* 111 107  CO2 22 21* 25 23 22   GLUCOSE 91 110* 135* 88 83  BUN 20 28* 24* 19 13  CREATININE 0.62 0.59* 0.43* 0.53* 0.51*  CALCIUM 8.1* 8.0* 8.3* 8.0* 7.9*  MG  --   --   --   --  1.8  PHOS  --   --   --   --  2.0*   Liver Function Tests: Recent Labs  Lab 03/29/20 0428 03/30/20 0406 03/31/20 0408 04/01/20 0411 04/02/20 0356  AST 91* 62* 43* 42* 39  ALT 40 11 13 13 13   ALKPHOS 41 40 36* 37* 40  BILITOT 0.4 0.5 0.4 0.6 0.6  PROT 6.6 6.7 6.4* 6.2* 6.2*  ALBUMIN 2.9* 2.9* 2.7* 2.5* 2.5*   No results for input(s):  LIPASE, AMYLASE in the last 168 hours. No results for input(s): AMMONIA in the last 168 hours. Cardiac Enzymes: No results for input(s): CKTOTAL, CKMB, CKMBINDEX, TROPONINI in the last 168 hours. BNP (last 3 results) No results for input(s): BNP in the last 8760 hours.  ProBNP (last 3 results) No results for input(s): PROBNP in the last 8760 hours.  CBG: Recent Labs  Lab 03/30/20 1443  GLUCAP 130*   Recent Results (from the past 240 hour(s))  Blood Culture (routine x 2)     Status: None   Collection Time: 03/27/20  9:44 PM   Specimen: BLOOD  Result Value Ref Range Status   Specimen Description   Final    BLOOD BLOOD LEFT WRIST Performed at [facility], [address], [place], [place]    Special Requests   Final    BOTTLES DRAWN AEROBIC AND ANAEROBIC Blood Culture adequate volume Performed at [facility], [address] [place], [place]    Culture   Final    NO GROWTH 5 DAYS Performed at [facility], [address], [place], [address] [place]    Report Status 04/02/2020 FINAL  Final  Blood Culture (routine x 2)     Status: None   Collection Time: 03/27/20  9:49 PM   Specimen: BLOOD  Result Value Ref Range Status   Specimen Description   Final    BLOOD BLOOD RIGHT FOREARM Performed at [facility], [address], [place], [place]    Special Requests   Final    BOTTLES DRAWN AEROBIC AND ANAEROBIC Blood Culture adequate volume Performed at [facility], [address] [place], [place]    Culture   Final    NO GROWTH 5 DAYS Performed at [facility], [address] [place], [address] [place]    Report Status 04/02/2020 FINAL  Final  Urine culture     Status: Abnormal   Collection Time: 03/27/20 11:13 PM   Specimen:  In/Out Cath Urine  Result Value Ref Range Status   Specimen Description   Final    IN/OUT CATH URINE Performed at  [facility], [address] [place], [place] [address]    Special Requests   Final    NONE Performed at [facility], [address], [place], [place] [address]    Culture (A)  Final    >=100,000 COLONIES/mL STAPHYLOCOCCUS AUREUS >=100,000 COLONIES/mL PSEUDOMONAS PUTIDA    Report Status 04/01/2020 FINAL  Final   Organism ID, Bacteria PSEUDOMONAS PUTIDA (A)  Final   Organism ID, Bacteria STAPHYLOCOCCUS AUREUS (A)  Final      Susceptibility   Pseudomonas putida - MIC*    CEFTAZIDIME 8 SENSITIVE Sensitive     CIPROFLOXACIN >=4 RESISTANT Resistant     GENTAMICIN >=16 RESISTANT Resistant     IMIPENEM >=16 RESISTANT Resistant     PIP/TAZO 16 SENSITIVE Sensitive     * >=100,000 COLONIES/mL PSEUDOMONAS PUTIDA   Staphylococcus aureus - MIC*    CIPROFLOXACIN >=8 RESISTANT Resistant     GENTAMICIN <=0.5 SENSITIVE Sensitive     NITROFURANTOIN <=16 SENSITIVE Sensitive     OXACILLIN 1 SENSITIVE Sensitive     TETRACYCLINE <=1 SENSITIVE Sensitive     VANCOMYCIN <=0.5 SENSITIVE Sensitive     TRIMETH/SULFA <=10 SENSITIVE Sensitive     CLINDAMYCIN <=0.25 SENSITIVE Sensitive     RIFAMPIN <=0.5 SENSITIVE Sensitive     Inducible Clindamycin NEGATIVE Sensitive     * >=100,000 COLONIES/mL STAPHYLOCOCCUS AUREUS     Studies: No results found.    Joycelyn Das, MD  [facility] 04/02/2020  If 7PM-7AM, please contact night-coverage

## 2020-04-03 LAB — BASIC METABOLIC PANEL
Anion gap: 9 (ref 5–15)
BUN: 9 mg/dL (ref 8–23)
CO2: 23 mmol/L (ref 22–32)
Calcium: 7.9 mg/dL — ABNORMAL LOW (ref 8.9–10.3)
Chloride: 104 mmol/L (ref 98–111)
Creatinine, Ser: 0.52 mg/dL — ABNORMAL LOW (ref 0.61–1.24)
GFR, Estimated: >60 mL/min (ref >60)
Glucose, Bld: 90 mg/dL (ref 70–99)
Potassium: 3.3 mmol/L — ABNORMAL LOW (ref 3.5–5.1)
Sodium: 136 mmol/L (ref 135–145)

## 2020-04-03 LAB — PHOSPHORUS: Phosphorus: 2.1 mg/dL — ABNORMAL LOW (ref 2.5–4.6)

## 2020-04-03 LAB — MAGNESIUM: Magnesium: 1.7 mg/dL (ref 1.7–2.4)

## 2020-04-03 MED ORDER — POTASSIUM & SODIUM PHOSPHATES 280-160-250 MG PO PACK
1.0000 | PACK | Freq: Three times a day (TID) | ORAL | Status: DC
  Administered 2020-04-03: 1 via ORAL
  Filled 2020-04-03 (×2): qty 1

## 2020-04-03 MED ORDER — BISACODYL 10 MG RE SUPP: 10.0000 mg | Freq: Every day | RECTAL | 0 refills | Status: AC | PRN

## 2020-04-03 MED ORDER — GUAIFENESIN-DM 100-10 MG/5ML PO SYRP: 10.0000 mL | ORAL_SOLUTION | ORAL | 0 refills | Status: AC | PRN

## 2020-04-03 MED ORDER — POTASSIUM & SODIUM PHOSPHATES 280-160-250 MG PO PACK: 1.0000 | PACK | Freq: Three times a day (TID) | ORAL | 0 refills | Status: AC

## 2020-04-03 NOTE — Discharge Summary (Signed)
Physician Discharge Summary  [name] IZT:[id number] DOB: Jul 29, 1940 DOA: 03/27/2020  PCP: Patient, No Pcp Per  Admit date: 03/27/2020 Discharge date: 04/03/2020  Admitted From: Home  Discharge disposition: Home  Recommendations for Outpatient Follow-Up:   . Follow up with your primary care provider in one week.  . Check CBC, BMP, magnesium in the next visit  Discharge Diagnosis:   Active Problems:   Parkinson's disease (HCC)   Sacral wound   Malnutrition of moderate degree   Dementia with behavioral disturbance (HCC)   Acute lower UTI   COVID-19 virus infection   Altered mental status   Discharge Condition: Improved.  Diet recommendation: Dysphagia 3 diet.  Wound care: None.  Code status: Full.   History of Present Illness:   Mark Mathews a 79 y.o.malewithParkinson's disease, chronic sacral decubitus ulcer presented to the hospital with altered mental status.  Patient currently lives with his family and is bedridden due to Parkinson's disease.  Patient's family was concerned about worsening bedsore in the sacral region a potential infection since it was more open and draining.  Of note, patient was with hospice [facility] in the past but is not currently on hospice.    In the ED, patient was noted to be febrile with a temperature of 100.2 F and his blood blood pressure was borderline low.  Initial WBC was 4.1.  Urinalysis was abnormal with large leukocytes nitrites and RBC.-Urinalysis with large leukocytes nitrites WBCs and RBCs. Lactic acid was 1.4.  Chest x-ray done in the ED showed low lung volumes and bibasilar atelectasis.  CT head and cervical spine without acute findings but for apical pneumonia.  CT abdomen and pelvis with decubitus ulceration.  Patient was given 500 mL of IV fluid bolus and IV Rocephin and was admitted to hospital for further evaluation and treatment.   Hospital Course:   Following conditions were addressed during  hospitalization as listed below,  Altered mental status likely metabolic encephalopathy. Improved.  More interactive alert awake and communicative today, thought to be secondary to UTI.    Patient was given IV Unasyn during hospitalization.  Urine culture showing multiple organisms of staph aureus, Pseudomonas.  Blood cultures were negative in 5 days.    Has completed course of antibiotic.  Patient likely at his near baseline  Dysphagia.  History of Parkinson's disease , and speech and swallow evaluation and was advised dysphagia III diet   Covid infection Patient was not hypoxic. completed IV remdesivir.  Parkinson's disease Continue Sinemet  Immobility resulting in sacral decubitus ulcer Continue wound care on discharge..  Failure to thrive/Moderate Protein calorie malnutrition.  Encourage oral nutrition.   Hypokalemia.   Improved with aggressive replacement.  Will need to improve nutrition and check potassium levels as outpatient.  Will be given Neutra-Phos on discharge.  Pressure injury, bed bound status.  Mid sacral stage IV pressure ulceration present on admission.  Present on admission.  Continue wound care.  Mild hypophosphatemia.  Will be given Neutra-Phos on discharge   Generalized debility, deconditioning.  Seen by physical therapy.    Plan is home on discharge.  Disposition.  At this time, patient is stable for disposition home with family.  Patient was advised to follow-up with primary care physician as outpatient.  Medical Consultants:    None.  Procedures:    None Subjective:   Today, patient was seen and examined at bedside.  Appears to be more alert awake today.  Denies obvious pain fever chills  Discharge  Exam:   Vitals:   04/02/20 2035 04/03/20 0408  BP: 121/71 (!) 122/59  Pulse: 71 83  Resp: 20 (!) 21  Temp: 98.8 F (37.1 C) 99.6 F (37.6 C)  SpO2: 95% 92%   Vitals:   04/02/20 0615 04/02/20 1312 04/02/20 2035 04/03/20 0408  BP: 119/64  134/71 121/71 (!) 122/59  Pulse: 74 65 71 83  Resp: 19 16 20  (!) 21  Temp: 97.7 F (36.5 C) 98.3 F (36.8 C) 98.8 F (37.1 C) 99.6 F (37.6 C)  TempSrc: Oral Oral  Oral  SpO2: 96% 95% 95% 92%  Weight:      Height:       General: Alert awake, not in obvious distress thinly built, chronically ill, bedbound, answering questions appropriately, HENT: pupils equally reacting to light,  No scleral pallor or icterus noted. Oral mucosa is moist.  Chest:  Clear breath sounds.  Diminished breath sounds bilaterally. No crackles or wheezes.  CVS: S1 &S2 heard. No murmur.  Regular rate and rhythm. Abdomen: Soft, nontender, nondistended.  Bowel sounds are heard.   Extremities: No cyanosis, clubbing or edema.  Peripheral pulses are palpable. Psych: Alert, awake and communicative, flexed posture, CNS:  No cranial nerve deficits.  Flexed posture, bedbound status, flat affect, involuntary tremors of the upper extremities. Skin: Warm and dry.  Stage IV sacral decubitus ulceration present on admission  The results of significant diagnostics from this hospitalization (including imaging, microbiology, ancillary and laboratory) are listed below for reference.     Diagnostic Studies:   CT ABDOMEN PELVIS WO CONTRAST  Result Date: 03/28/2020 CLINICAL DATA:  Hematuria and fevers with soft tissue swelling surrounding suprapubic catheter EXAM: CT ABDOMEN AND PELVIS WITHOUT CONTRAST TECHNIQUE: Multidetector CT imaging of the abdomen and pelvis was performed following the standard protocol without IV contrast. COMPARISON:  01/15/2019 FINDINGS: Lower chest: Bibasilar atelectatic changes are seen. No sizable effusion is noted. These changes are likely in part related to the known history of COVID-19 positivity. Hepatobiliary: No focal liver abnormality is seen. No gallstones, gallbladder wall thickening, or biliary dilatation. Pancreas: Mild fatty interdigitation of the pancreas is seen. No focal mass is noted. Spleen:  Normal in size without focal abnormality. Adrenals/Urinary Tract: Adrenal glands are within normal limits. Left kidney demonstrates a prominent extrarenal pelvis similar to that seen on the prior exam. Prominent extrarenal pelvis on the right is noted as well. Tiny nonobstructing right renal stone is seen. Ureters are within normal limits. The bladder is decompressed with mild wall thickening related to the decompressive nature. By history a suprapubic catheter is present although no definitive catheter is seen. Mild induration at the previous catheter site is seen without abscess formation. Stomach/Bowel: Prominent fecal material within the rectal vault is noted although no obstructive changes are seen. No significant constipation is noted. The appendix is not visualized consistent with prior surgical history. Stomach and small bowel are within normal limits. Vascular/Lymphatic: Aortic atherosclerosis. No enlarged abdominal or pelvic lymph nodes. Reproductive: Prostate is unremarkable. Other: No abdominal wall hernia or abnormality. No abdominopelvic ascites. Musculoskeletal: Decubitus ulcer is noted posteriorly. No definitive bony erosive changes are identified to suggest osteomyelitis. No focal abscess is seen. Degenerative changes of lumbosacral spine are noted. IMPRESSION: No definitive findings to suggest sacral osteomyelitis. A decubitus ulcer is seen. Bibasilar atelectatic changes. This may be related to the underlying COVID-19 positivity. Tiny nonobstructing stone. By history, a suprapubic catheter is present although no catheter is identified on this exam. Some induration at the previous  catheter site is noted. Clinical correlation is recommended. Mild prominent fecal material within the rectal wall without obstructive change. Electronically Signed   By: Alcide Clever M.D.   On: 03/28/2020 08:53   CT Head Wo Contrast  Result Date: 03/28/2020 CLINICAL DATA:  Encephalopathy.  COVID-19. EXAM: CT HEAD  WITHOUT CONTRAST CT CERVICAL SPINE WITHOUT CONTRAST TECHNIQUE: Multidetector CT imaging of the head and cervical spine was performed following the standard protocol without intravenous contrast. Multiplanar CT image reconstructions of the cervical spine were also generated. COMPARISON:  None. FINDINGS: CT HEAD FINDINGS Brain: There is no mass, hemorrhage or extra-axial collection. There is generalized atrophy without lobar predilection. There is hypoattenuation of the periventricular white matter, most commonly indicating chronic ischemic microangiopathy. Vascular: No abnormal hyperdensity of the major intracranial arteries or dural venous sinuses. No intracranial atherosclerosis. Skull: The visualized skull base, calvarium and extracranial soft tissues are normal. Sinuses/Orbits: Bilateral mastoid fluid.  The orbits are normal. CT CERVICAL SPINE FINDINGS Alignment: No static subluxation. Facets are aligned. Occipital condyles are normally positioned. Skull base and vertebrae: No acute fracture. Soft tissues and spinal canal: No prevertebral fluid or swelling. No visible canal hematoma. Disc levels: No advanced spinal canal or neural foraminal stenosis. Upper chest: Biapical opacities. Other: Normal visualized paraspinal cervical soft tissues. IMPRESSION: 1. No acute intracranial abnormality. 2. No acute fracture or static subluxation of the cervical spine. 3. Bilateral mastoid fluid. 4. Biapical pneumonia. Electronically Signed   By: Deatra Robinson M.D.   On: 03/28/2020 01:51   CT Cervical Spine Wo Contrast  Result Date: 03/28/2020 CLINICAL DATA:  Encephalopathy.  COVID-19. EXAM: CT HEAD WITHOUT CONTRAST CT CERVICAL SPINE WITHOUT CONTRAST TECHNIQUE: Multidetector CT imaging of the head and cervical spine was performed following the standard protocol without intravenous contrast. Multiplanar CT image reconstructions of the cervical spine were also generated. COMPARISON:  None. FINDINGS: CT HEAD FINDINGS Brain:  There is no mass, hemorrhage or extra-axial collection. There is generalized atrophy without lobar predilection. There is hypoattenuation of the periventricular white matter, most commonly indicating chronic ischemic microangiopathy. Vascular: No abnormal hyperdensity of the major intracranial arteries or dural venous sinuses. No intracranial atherosclerosis. Skull: The visualized skull base, calvarium and extracranial soft tissues are normal. Sinuses/Orbits: Bilateral mastoid fluid.  The orbits are normal. CT CERVICAL SPINE FINDINGS Alignment: No static subluxation. Facets are aligned. Occipital condyles are normally positioned. Skull base and vertebrae: No acute fracture. Soft tissues and spinal canal: No prevertebral fluid or swelling. No visible canal hematoma. Disc levels: No advanced spinal canal or neural foraminal stenosis. Upper chest: Biapical opacities. Other: Normal visualized paraspinal cervical soft tissues. IMPRESSION: 1. No acute intracranial abnormality. 2. No acute fracture or static subluxation of the cervical spine. 3. Bilateral mastoid fluid. 4. Biapical pneumonia. Electronically Signed   By: Deatra Robinson M.D.   On: 03/28/2020 01:51   DG Chest Port 1 View  Result Date: 03/27/2020 CLINICAL DATA:  Questionable sepsis.  Decreased responsiveness. EXAM: PORTABLE CHEST 1 VIEW COMPARISON:  02/27/2019 FINDINGS: Low lung volumes. Mild cardiomegaly. Minimal bibasilar densities, likely atelectasis. No effusions. No acute bony abnormality. IMPRESSION: Low lung volumes, bibasilar atelectasis. Electronically Signed   By: Charlett Nose M.D.   On: 03/27/2020 22:02     Labs:   Basic Metabolic Panel: Recent Labs  Lab 03/30/20 0406 03/31/20 0408 04/01/20 0411 04/02/20 0356 04/03/20 0349  NA 141 145 142 137 136  K 3.0* 2.9* 2.9* 3.2* 3.3*  CL 110 112* 111 107 104  CO2 21*  25 23 22 23   GLUCOSE 110* 135* 88 83 90  BUN 28* 24* 19 13 9   CREATININE 0.59* 0.43* 0.53* 0.51* 0.52*  CALCIUM 8.0*  8.3* 8.0* 7.9* 7.9*  MG  --   --   --  1.8 1.7  PHOS  --   --   --  2.0* 2.1*   GFR Estimated Creatinine Clearance: 64 mL/min (A) (by C-G formula based on SCr of 0.52 mg/dL (L)). Liver Function Tests: Recent Labs  Lab 03/29/20 0428 03/30/20 0406 03/31/20 0408 04/01/20 0411 04/02/20 0356  AST 91* 62* 43* 42* 39  ALT 40 11 13 13 13   ALKPHOS 41 40 36* 37* 40  BILITOT 0.4 0.5 0.4 0.6 0.6  PROT 6.6 6.7 6.4* 6.2* 6.2*  ALBUMIN 2.9* 2.9* 2.7* 2.5* 2.5*   No results for input(s): LIPASE, AMYLASE in the last 168 hours. No results for input(s): AMMONIA in the last 168 hours. Coagulation profile Recent Labs  Lab 03/27/20 2143  INR 1.1    CBC: Recent Labs  Lab 03/29/20 0428 03/30/20 0406 03/31/20 0408 04/01/20 0411 04/02/20 0356  WBC 4.0 4.8 4.7 5.0 5.4  NEUTROABS 1.7 2.4 2.6 2.4 2.9  HGB 11.3* 11.3* 11.1* 10.5* 10.8*  HCT 36.3* 34.8* 34.7* 32.5* 33.5*  MCV 95.8 92.8 92.8 92.3 91.8  PLT 259 277 287 296 312   Cardiac Enzymes: No results for input(s): CKTOTAL, CKMB, CKMBINDEX, TROPONINI in the last 168 hours. BNP: Invalid input(s): POCBNP CBG: Recent Labs  Lab 03/30/20 1443  GLUCAP 130*   D-Dimer No results for input(s): DDIMER in the last 72 hours. Hgb A1c No results for input(s): HGBA1C in the last 72 hours. Lipid Profile No results for input(s): CHOL, HDL, LDLCALC, TRIG, CHOLHDL, LDLDIRECT in the last 72 hours. Thyroid function studies No results for input(s): TSH, T4TOTAL, T3FREE, THYROIDAB in the last 72 hours.  Invalid input(s): FREET3 Anemia work up No results for input(s): VITAMINB12, FOLATE, FERRITIN, TIBC, IRON, RETICCTPCT in the last 72 hours. Microbiology Recent Results (from the past 240 hour(s))  Blood Culture (routine x 2)     Status: None   Collection Time: 03/27/20  9:44 PM   Specimen: BLOOD  Result Value Ref Range Status   Specimen Description   Final    BLOOD BLOOD LEFT WRIST Performed at [facility], [address] [place], [place] [address]    Special Requests   Final    BOTTLES DRAWN AEROBIC AND ANAEROBIC Blood Culture adequate volume Performed at [facility], [address] [place], [place] [address]    Culture   Final    NO GROWTH 5 DAYS Performed at [facility], [address] [place], [place] [address]    Report Status 04/02/2020 FINAL  Final  Blood Culture (routine x 2)     Status: None   Collection Time: 03/27/20  9:49 PM   Specimen: BLOOD  Result Value Ref Range Status   Specimen Description   Final    BLOOD BLOOD RIGHT FOREARM Performed at [facility], [address] [place], [place] [address]    Special Requests   Final    BOTTLES DRAWN AEROBIC AND ANAEROBIC Blood Culture adequate volume Performed at [facility], [address] [place], [place] [address]    Culture   Final    NO GROWTH 5 DAYS Performed at [facility], [address] [place], [place] [address]    Report Status 04/02/2020  FINAL  Final  Urine culture     Status: Abnormal   Collection Time: 03/27/20 11:13 PM   Specimen: In/Out Cath Urine  Result Value Ref Range Status   Specimen Description   Final    IN/OUT CATH URINE Performed at [facility], [address], [place], [place] [address]    Special Requests   Final    NONE Performed at [facility], [address] [place], [place] [address]    Culture (A)  Final    >=100,000 COLONIES/mL STAPHYLOCOCCUS AUREUS >=100,000 COLONIES/mL PSEUDOMONAS PUTIDA    Report Status 04/01/2020 FINAL  Final   Organism ID, Bacteria PSEUDOMONAS PUTIDA (A)  Final   Organism ID, Bacteria STAPHYLOCOCCUS AUREUS (A)  Final      Susceptibility   Pseudomonas putida - MIC*    CEFTAZIDIME 8 SENSITIVE Sensitive     CIPROFLOXACIN >=4 RESISTANT Resistant     GENTAMICIN >=16 RESISTANT Resistant     IMIPENEM >=16 RESISTANT Resistant     PIP/TAZO 16 SENSITIVE Sensitive      * >=100,000 COLONIES/mL PSEUDOMONAS PUTIDA   Staphylococcus aureus - MIC*    CIPROFLOXACIN >=8 RESISTANT Resistant     GENTAMICIN <=0.5 SENSITIVE Sensitive     NITROFURANTOIN <=16 SENSITIVE Sensitive     OXACILLIN 1 SENSITIVE Sensitive     TETRACYCLINE <=1 SENSITIVE Sensitive     VANCOMYCIN <=0.5 SENSITIVE Sensitive     TRIMETH/SULFA <=10 SENSITIVE Sensitive     CLINDAMYCIN <=0.25 SENSITIVE Sensitive     RIFAMPIN <=0.5 SENSITIVE Sensitive     Inducible Clindamycin NEGATIVE Sensitive     * >=100,000 COLONIES/mL STAPHYLOCOCCUS AUREUS     Discharge Instructions:   Discharge Instructions    Call MD for:  persistant nausea and vomiting   Complete by: As directed    Call MD for:  severe uncontrolled pain   Complete by: As directed    Call MD for:  temperature >100.4   Complete by: As directed    Diet general   Complete by: As directed    Dysphagia III diet   Discharge instructions   Complete by: As directed    Follow-up with your primary care physician in 1 week.  Check blood work at that time.   Discharge wound care:   Complete by: As directed    Apply Aquacel packing Hart [place] # [phone]) to sacrum wound Q day, using swab to fill, then cover with ABD pads and tape.   Increase activity slowly   Complete by: As directed      Allergies as of 04/03/2020   No Known Allergies     Medication List    STOP taking these medications   HYDROcodone-acetaminophen 10-325 MG tablet Commonly known as: NORCO   levofloxacin 500 MG tablet Commonly known as: LEVAQUIN   VITAMIN E PO     TAKE these medications   acetaminophen 500 MG tablet Commonly known as: TYLENOL Take 2 tablets (1,000 mg total) by mouth every 8 (eight) hours as needed. What changed: reasons to take this   bisacodyl 10 MG suppository Commonly known as: DULCOLAX Place 1 suppository (10 mg total) rectally daily as needed for moderate constipation.   carbidopa-levodopa 25-100 MG tablet Commonly known as: SINEMET  IR Take 1 tablet by mouth 5 (five) times daily.   cholecalciferol 25 MCG (1000 UNIT) tablet Commonly known as: VITAMIN D3 Take 1,000 Units by mouth daily.   guaiFENesin-dextromethorphan 100-10 MG/5ML syrup Commonly known as: ROBITUSSIN DM Take 10  mLs by mouth every 4 (four) hours as needed for cough.   mirabegron ER 50 MG Tb24 tablet Commonly known as: MYRBETRIQ Take 50 mg by mouth daily.   multivitamin with minerals tablet Take 1 tablet by mouth daily.   potassium & sodium phosphates 280-160-250 MG Pack Commonly known as: PHOS-NAK Take 1 packet by mouth 4 (four) times daily -  before meals and at bedtime for 3 days.            Discharge Care Instructions  (From admission, onward)         Start     Ordered   04/03/20 0000  Discharge wound care:       Comments: Apply Aquacel packing Hart [place] # [phone]) to sacrum wound Q day, using swab to fill, then cover with ABD pads and tape.   04/03/20 1027          Follow-up Information    [facility] Follow up.   Specialty: Home Health Services Why: This is the company that will be providing the RN.  Contact information: [address] [place] [address] [phone]        Annita Brod, MD Follow up.   Specialty: Internal Medicine Contact information: [address] [place] [address] [phone]                Time coordinating discharge: 39 minutes  Signed:  Harrel Mathews  [facility] 04/03/2020, 10:28 AM

## 2020-04-03 NOTE — Care Management Important Message (Signed)
Important Message  Patient Details IM Letter placed in Patient's door Caddy. Name: Mark Mathews MRN: [id number] Date of Birth: November 06, 1940   Medicare Important Message Given:  Yes     Caren Macadam 04/03/2020, 10:50 AM

## 2020-04-03 NOTE — TOC Progression Note (Signed)
Transition of Care [facility]) - Progression Note    Patient Details  Name: Mark Mathews MRN: [id number] Date of Birth: 06-Dec-1940  [facility] CM/SW Contact  Armanda Heritage, RN Phone Number: 04/03/2020, 1:14 PM  Clinical Narrative:    [facility] transport arranged.   Expected Discharge Plan: Home/Self Care Barriers to Discharge: No Barriers Identified  Expected Discharge Plan and Services Expected Discharge Plan: Home/Self Care   Discharge Planning Services: CM Consult Post Acute Care Choice: Home Health Living arrangements for the past 2 months: Single Family Home Expected Discharge Date: 04/03/20                           [facility] Agency: [facility] Date [facility] Agency Contacted: 03/31/20 Time HH Agency Contacted: 1325 Representative spoke with at [facility] Agency: Cindie   Social Determinants of Health (SDOH) Interventions    Readmission Risk Interventions No flowsheet data found.

## 2020-04-03 NOTE — Progress Notes (Signed)
Patient discharged from facility with [facility]. Mark Mathews called and made aware.

## 2020-04-03 NOTE — Plan of Care (Signed)

## 2020-05-31 ENCOUNTER — Telehealth: Payer: Self-pay

## 2020-05-31 NOTE — Telephone Encounter (Signed)
Message left for patient/son to check in and offer to schedule FU visit.

## 2020-05-31 NOTE — Telephone Encounter (Signed)
MY chart message sent to check in and offer to schedule fu visit with Palliative care

## 2020-06-16 ENCOUNTER — Telehealth: Payer: Self-pay

## 2020-06-16 NOTE — Telephone Encounter (Signed)
Volunteer with Palliative care called patient to check in- no answer.

## 2021-08-08 IMAGING — DX DG ABDOMEN 1V
1 series · 1 of 1 positions shown · non-contrast
Comparison: CT of the abdomen pelvis dated 01/15/2019.

CLINICAL DATA: 77-year-old male with constipation.

EXAM:
ABDOMEN - 1 VIEW

[abdomen kub]
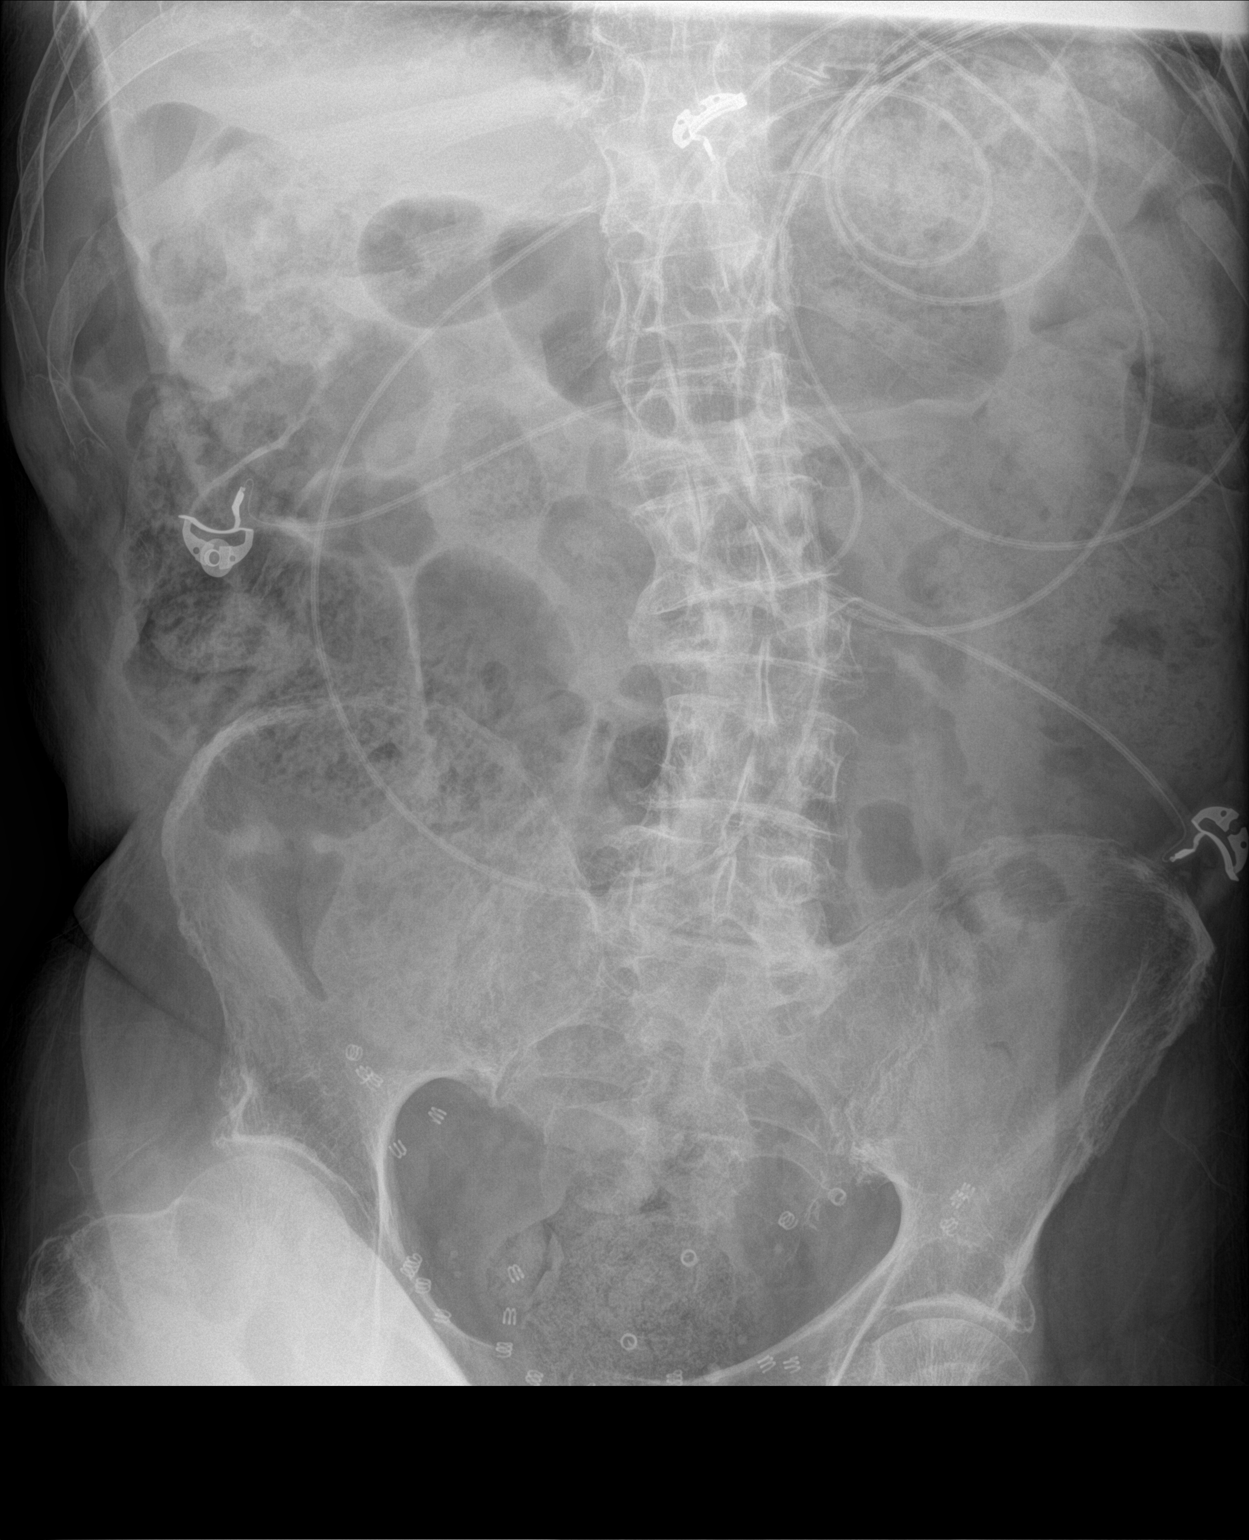

[1 of 1 positions shown; findings below may reference images not displayed]

FINDINGS: There is large amount of stool throughout the colon. No bowel
dilatation or evidence of obstruction. No free air or radiopaque
calculi. Osteopenia with degenerative changes of the spine. Hernia
repair mesh noted over the pelvis.
IMPRESSION: Constipation. No bowel obstruction.

## 2021-08-08 IMAGING — CT CT HEAD W/O CM
3 series · 16 of 47 positions shown, 19 images · non-contrast
Comparison: 02/10/2018

CLINICAL DATA: Unexplained altered level of consciousness today.
Somnolent.

EXAM:
CT HEAD WITHOUT CONTRAST
TECHNIQUE: Contiguous axial images were obtained from the base of the skull
through the vertex without intravenous contrast.

[Series 2: head wo · axial · 0.45mm/px · z∈[+1520,+1660]mm · 10 of 34 slices shown, 13 images]
[im 3/34  brain]
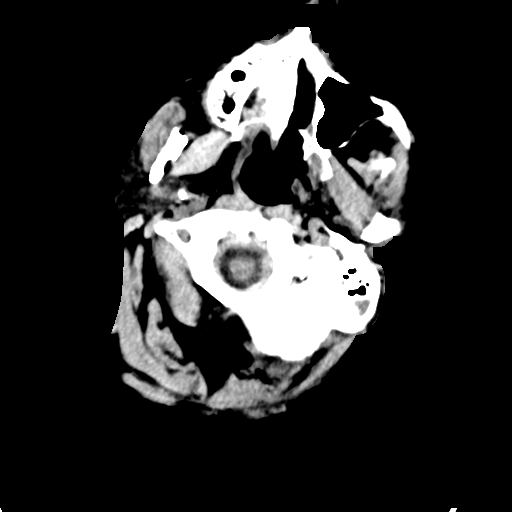
[im 3/34  bone]
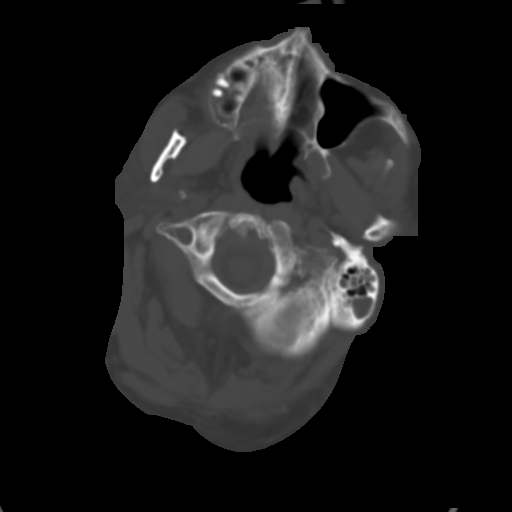
[im 6/34  brain]
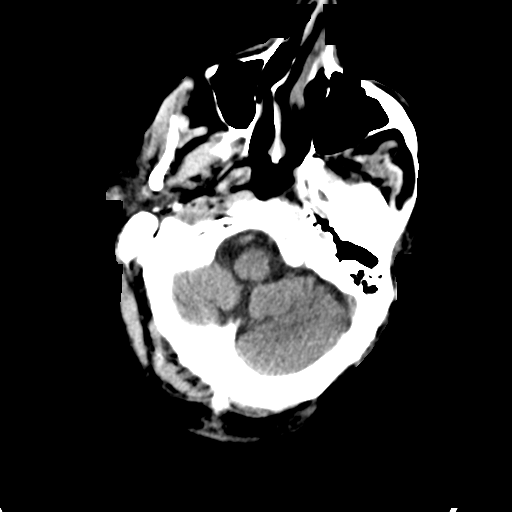
[im 10/34  brain]
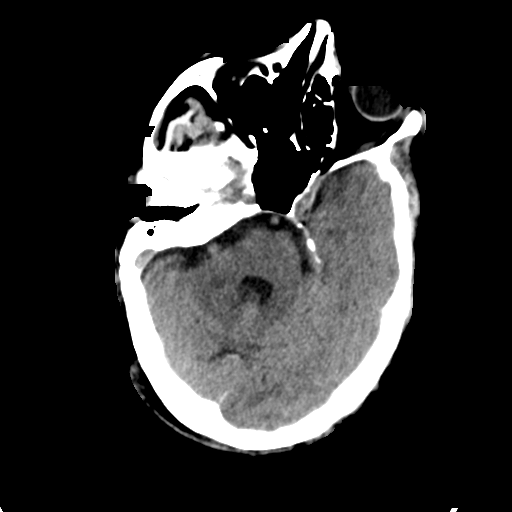
[im 12/34  brain]
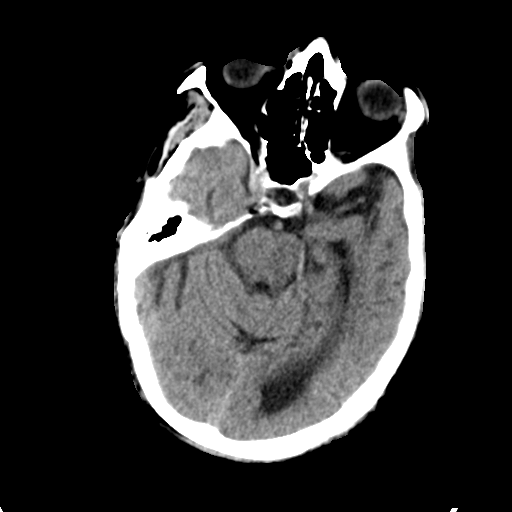
[im 15/34  brain]
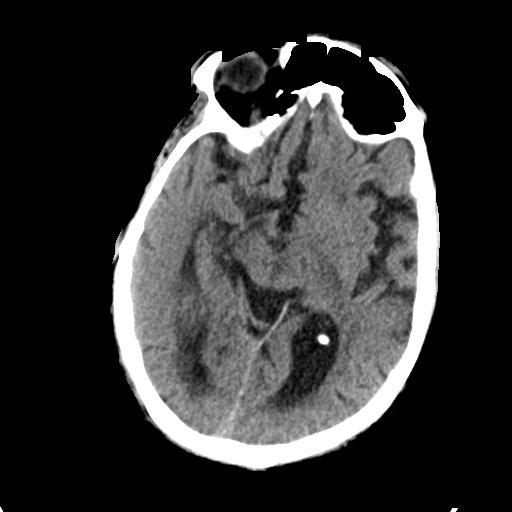
[im 15/34  bone]
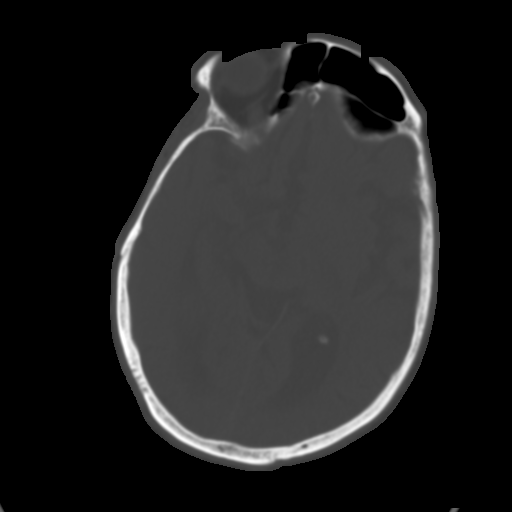
[im 19/34  brain]
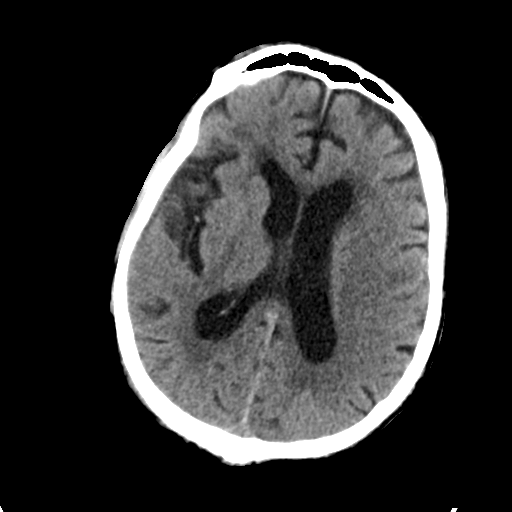
[im 22/34  brain]
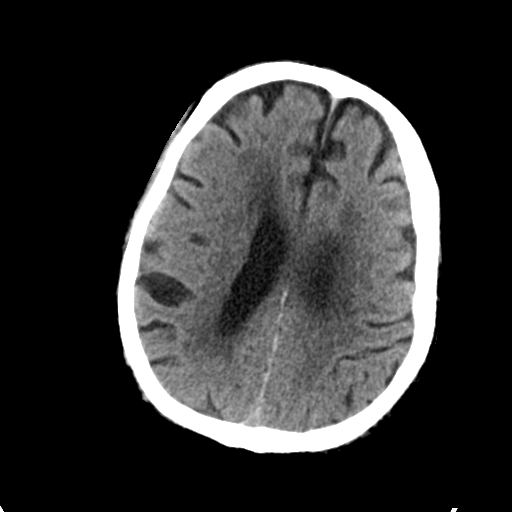
[im 26/34  brain]
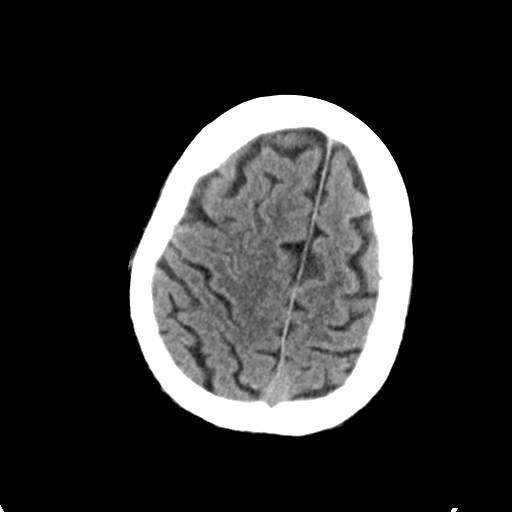
[im 28/34  brain]
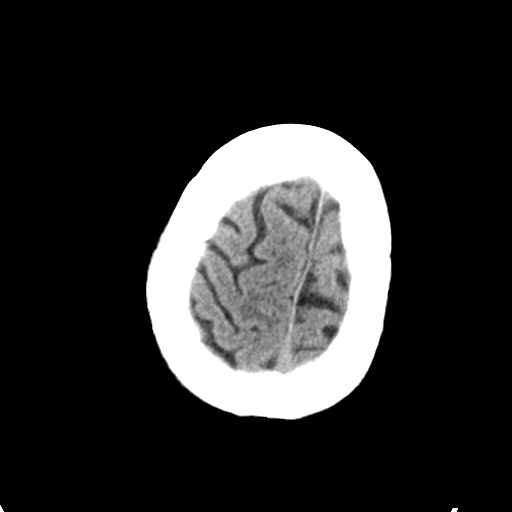
[im 28/34  bone]
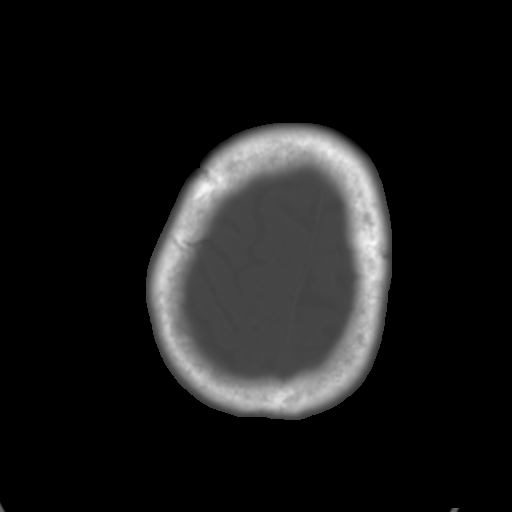
[im 31/34  brain]
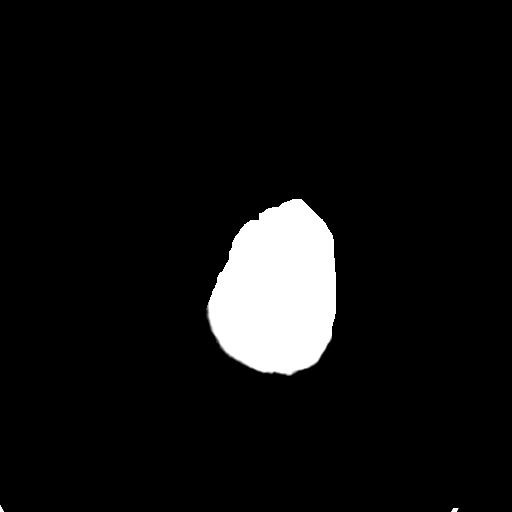

[Series 4: coronal soft tissue · coronal · 0.31mm/px · 3 of 68 slices shown]
[im 24/68  brain]
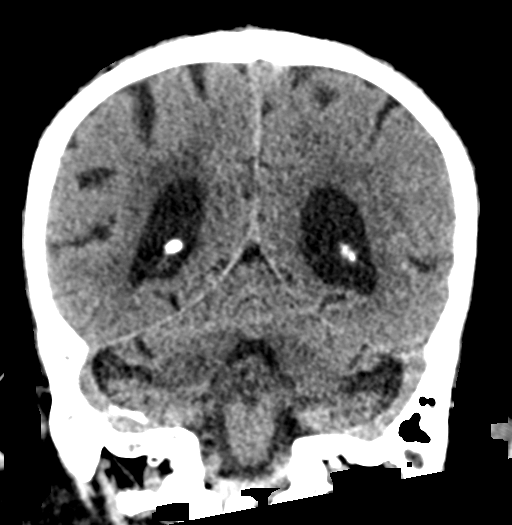
[im 31/68  brain]
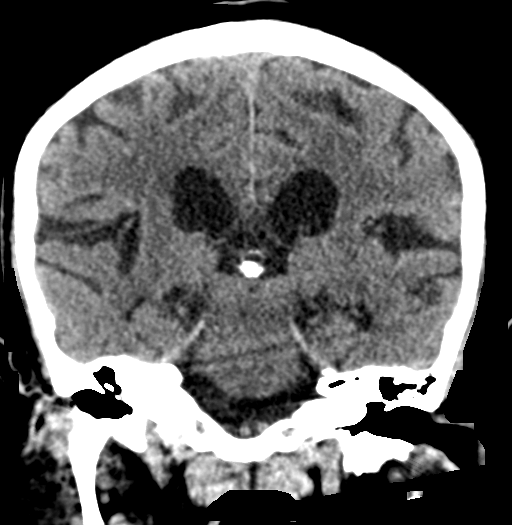
[im 37/68  brain]
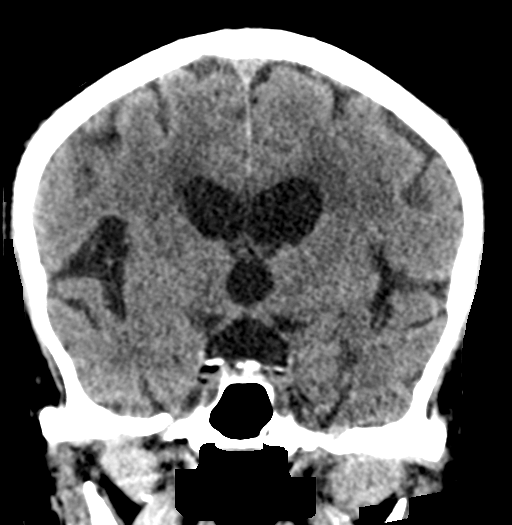

[Series 5: sagittal soft tissue · sagittal · 0.32mm/px · 3 of 54 slices shown]
[im 22/54  brain]
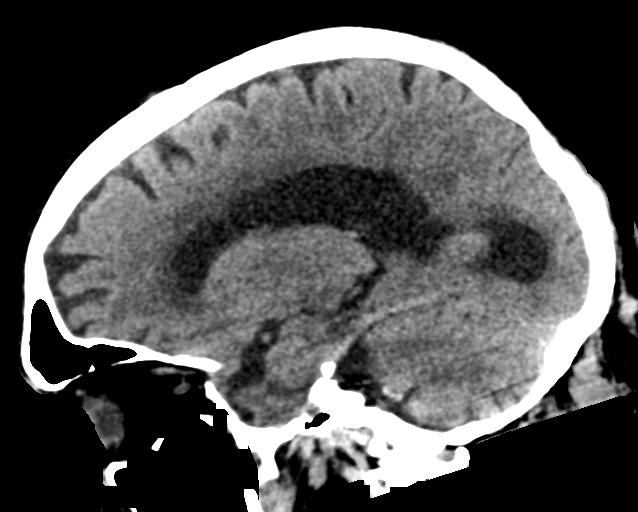
[im 27/54  brain]
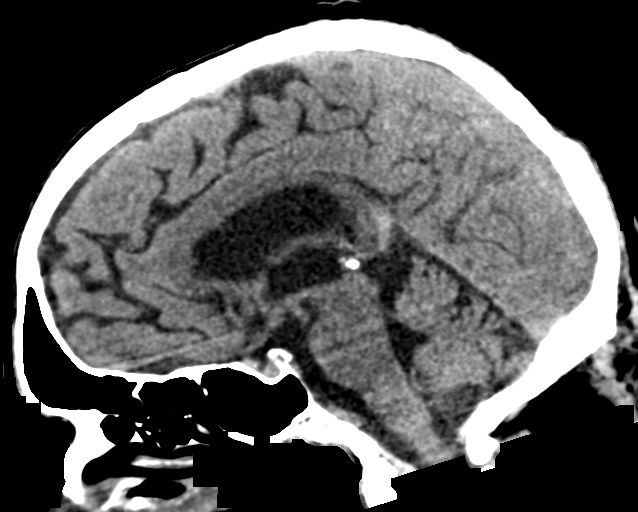
[im 32/54  brain]
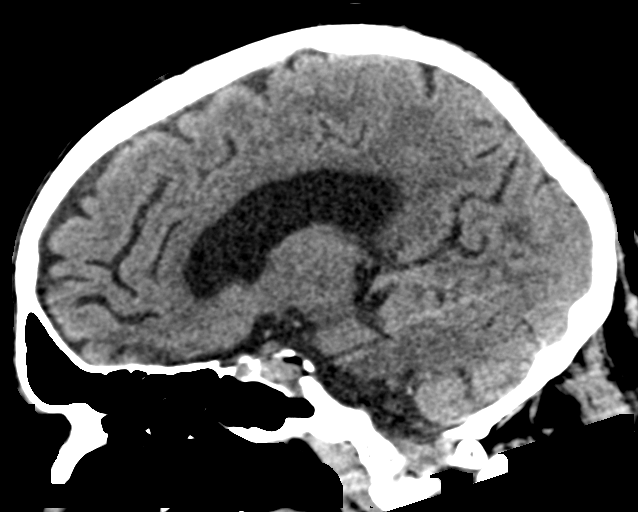

[16 of 47 positions shown; findings below may reference images not displayed]

FINDINGS: Brain: Generalized atrophy. Chronic small-vessel ischemic changes of
the white matter. No sign of acute infarction, mass lesion,
hemorrhage, hydrocephalus or extra-axial collection.

Vascular: No abnormal vascular finding.

Skull: Negative

Sinuses/Orbits: Clear/normal

Other: None
IMPRESSION: No acute finding. Atrophy and chronic small-vessel ischemic changes.

## 2023-12-03 DEATH — deceased
